# Patient Record
Sex: Female | Born: 1985 | Race: White | Hispanic: No | Marital: Married | Smoking: Former smoker
Health system: Southern US, Community
[De-identification: ages and names within clinical notes are randomized; demographics above are authoritative.]

## PROBLEM LIST (undated history)

## (undated) DIAGNOSIS — Z789 Other specified health status: Secondary | ICD-10-CM

## (undated) DIAGNOSIS — Z8619 Personal history of other infectious and parasitic diseases: Secondary | ICD-10-CM

## (undated) DIAGNOSIS — G35 Multiple sclerosis: Secondary | ICD-10-CM

## (undated) DIAGNOSIS — G35D Multiple sclerosis, unspecified: Secondary | ICD-10-CM

## (undated) HISTORY — PX: FACIAL COSMETIC SURGERY: SHX629

## (undated) HISTORY — PX: OTHER SURGICAL HISTORY: SHX169

## (undated) HISTORY — DX: Personal history of other infectious and parasitic diseases: Z86.19

## (undated) HISTORY — PX: INNER EAR SURGERY: SHX679

---

## 2012-11-14 ENCOUNTER — Encounter: Payer: Self-pay | Admitting: Gynecology

## 2012-11-14 ENCOUNTER — Ambulatory Visit (INDEPENDENT_AMBULATORY_CARE_PROVIDER_SITE_OTHER): Payer: 59 | Admitting: Gynecology

## 2012-11-14 ENCOUNTER — Other Ambulatory Visit (HOSPITAL_COMMUNITY)
Admission: RE | Admit: 2012-11-14 | Discharge: 2012-11-14 | Disposition: A | Discharge status: Home / Self Care | Payer: 59 | Source: Ambulatory Visit | Attending: Gynecology | Admitting: Gynecology

## 2012-11-14 VITALS — BP 110/72 | Ht 70.0 in | Wt 175.0 lb

## 2012-11-14 DIAGNOSIS — R635 Abnormal weight gain: Secondary | ICD-10-CM

## 2012-11-14 DIAGNOSIS — Z01419 Encounter for gynecological examination (general) (routine) without abnormal findings: Secondary | ICD-10-CM

## 2012-11-14 DIAGNOSIS — Z833 Family history of diabetes mellitus: Secondary | ICD-10-CM

## 2012-11-14 LAB — LIPID PANEL
Cholesterol: 137 mg/dL (ref 0–200)
HDL: 56 mg/dL (ref >39)
LDL Cholesterol: 75 mg/dL (ref 0–99)
Total CHOL/HDL Ratio: 2.4 Ratio
Triglycerides: 31 mg/dL (ref <150)
VLDL: 6 mg/dL (ref 0–40)

## 2012-11-14 LAB — CBC WITH DIFFERENTIAL/PLATELET
Basophils Absolute: 0 10*3/uL (ref 0.0–0.1)
Basophils Relative: 0 % (ref 0–1)
Eosinophils Absolute: 0.4 10*3/uL (ref 0.0–0.7)
Eosinophils Relative: 7 % — ABNORMAL HIGH (ref 0–5)
HCT: 39.8 % (ref 36.0–46.0)
Hemoglobin: 13.5 g/dL (ref 12.0–15.0)
Lymphocytes Relative: 41 % (ref 12–46)
Lymphs Abs: 2.4 10*3/uL (ref 0.7–4.0)
MCH: 30.8 pg (ref 26.0–34.0)
MCHC: 33.9 g/dL (ref 30.0–36.0)
MCV: 90.7 fL (ref 78.0–100.0)
Monocytes Absolute: 0.7 10*3/uL (ref 0.1–1.0)
Monocytes Relative: 12 % (ref 3–12)
Neutro Abs: 2.4 10*3/uL (ref 1.7–7.7)
Neutrophils Relative %: 40 % — ABNORMAL LOW (ref 43–77)
Platelets: 294 10*3/uL (ref 150–400)
RBC: 4.39 MIL/uL (ref 3.87–5.11)
RDW: 12.8 % (ref 11.5–15.5)
WBC: 5.9 10*3/uL (ref 4.0–10.5)

## 2012-11-14 LAB — HEMOGLOBIN A1C
Hgb A1c MFr Bld: 5.4 % (ref <5.7)
Mean Plasma Glucose: 108 mg/dL (ref <117)

## 2012-11-14 LAB — TSH: TSH: 1.644 u[IU]/mL (ref 0.350–4.500)

## 2012-11-14 NOTE — Progress Notes (Signed)
Chloe Pittman 1986/06/20 [id number]   History:    27 y.o.  for annual gyn exam who moved to [place] approximately 2 years ago from [place]. Patient stated she's only had one Pap smear and it was normal. She is in a steady monogamous relationship for several years.She's currently not using any form of contraception and is not interested at the present time. Her cycles are reported to be regular. Patient states she does her monthly self breast examination. Patient not interested in flu vaccine. Patient has not had the Gardasil Vaccine. Both parents are non-insulin-dependent diabetic.  Past medical history,surgical history, family history and social history were all reviewed and documented in the EPIC chart.  Gynecologic History Patient's last menstrual period was 10/16/2012. Contraception: none Last Pap: 2006. Results were: normal Last mammogram: Not indicated. Results were: Not indicated  Obstetric History OB History    Grav Para Term Preterm Abortions TAB SAB Ect Mult Living                   ROS: A ROS was performed and pertinent positives and negatives are included in the history.  GENERAL: No fevers or chills. HEENT: No change in vision, no earache, sore throat or sinus congestion. NECK: No pain or stiffness. CARDIOVASCULAR: No chest pain or pressure. No palpitations. PULMONARY: No shortness of breath, cough or wheeze. GASTROINTESTINAL: No abdominal pain, nausea, vomiting or diarrhea, melena or bright red blood per rectum. GENITOURINARY: No urinary frequency, urgency, hesitancy or dysuria. MUSCULOSKELETAL: No joint or muscle pain, no back pain, no recent trauma. DERMATOLOGIC: No rash, no itching, no lesions. ENDOCRINE: No polyuria, polydipsia, no heat or cold intolerance. No recent change in weight. HEMATOLOGICAL: No anemia or easy bruising or bleeding. NEUROLOGIC: No headache, seizures, numbness, tingling or weakness. PSYCHIATRIC: No depression, no loss of interest in normal activity or  change in sleep pattern.     Exam: chaperone present  BP 110/72  Ht 5\' 10"  (1.778 m)  Wt 175 lb (79.379 kg)  BMI 25.11 kg/m2  LMP 10/16/2012  Body mass index is 25.11 kg/(m^2).  General appearance : Well developed well nourished female. No acute distress HEENT: Neck supple, trachea midline, no carotid bruits, no thyroidmegaly Lungs: Clear to auscultation, no rhonchi or wheezes, or rib retractions  Heart: Regular rate and rhythm, no murmurs or gallops Breast:Examined in sitting and supine position were symmetrical in appearance, no palpable masses or tenderness,  no skin retraction, no nipple inversion, no nipple discharge, no skin discoloration, no axillary or supraclavicular lymphadenopathy Abdomen: no palpable masses or tenderness, no rebound or guarding Extremities: no edema or skin discoloration or tenderness  Pelvic:  Bartholin, Urethra, Skene Glands: Within normal limits             Vagina: No gross lesions or discharge  Cervix: No gross lesions or discharge  Uterus  retroverted, normal size, shape and consistency, non-tender and mobile  Adnexa  Without masses or tenderness  Anus and perineum  normal   Rectovaginal  normal sphincter tone without palpated masses or tenderness             Hemoccult not indicated     Assessment/Plan:  27 y.o. female for annual exam . We discussed the new Pap smear screening guidelines. Pap smear was done today. The following labs were ordered: Fasting lipid profile, hemoglobin A1c, CBC, urinalysis as well as TSH. Patient is going to try to obtain a copy of her immunization record so that we can update our filed. Literature  information on the Gardasil Vaccine was provided as well. Since she is not using any form of contraception of encouraged she start taking prenatal vitamins for neural tube prophylaxis in the event that she were to get pregnant. She was encouraged to do her monthly self breast examination.    Ok Edwards MD, 1:43 PM  11/14/2012

## 2012-11-14 NOTE — Patient Instructions (Addendum)

## 2013-11-20 ENCOUNTER — Encounter: Payer: 59 | Admitting: Gynecology

## 2014-08-13 ENCOUNTER — Encounter: Payer: Self-pay | Admitting: Gynecology

## 2014-08-18 ENCOUNTER — Encounter: Payer: Self-pay | Admitting: Gynecology

## 2014-08-18 ENCOUNTER — Telehealth: Payer: Self-pay | Admitting: Gynecology

## 2014-08-18 ENCOUNTER — Ambulatory Visit (INDEPENDENT_AMBULATORY_CARE_PROVIDER_SITE_OTHER): Payer: Managed Care, Other (non HMO) | Admitting: Gynecology

## 2014-08-18 VITALS — BP 124/70 | Ht 69.0 in | Wt 180.0 lb

## 2014-08-18 DIAGNOSIS — Z01419 Encounter for gynecological examination (general) (routine) without abnormal findings: Secondary | ICD-10-CM

## 2014-08-18 DIAGNOSIS — Z30014 Encounter for initial prescription of intrauterine contraceptive device: Secondary | ICD-10-CM

## 2014-08-18 NOTE — Patient Instructions (Signed)
Intrauterine Device Insertion Most often, an intrauterine device (IUD) is inserted into the uterus to prevent pregnancy. There are 2 types of IUDs available:  Copper IUD--This type of IUD creates an environment that is not favorable to sperm survival. The mechanism of action of the copper IUD is not known for certain. It can stay in place for 10 years.  Hormone IUD--This type of IUD contains the hormone progestin (synthetic progesterone). The progestin thickens the cervical mucus and prevents sperm from entering the uterus, and it also thins the uterine lining. There is no evidence that the hormone IUD prevents implantation. One hormone IUD can stay in place for up to 5 years, and a different hormone IUD can stay in place for up to 3 years. An IUD is the most cost-effective birth control if left in place for the full duration. It may be removed at any time. LET YOUR HEALTH CARE PROVIDER KNOW ABOUT:  Any allergies you have.  All medicines you are taking, including vitamins, herbs, eye drops, creams, and over-the-counter medicines.  Previous problems you or members of your family have had with the use of anesthetics.  Any blood disorders you have.  Previous surgeries you have had.  Possibility of pregnancy.  Medical conditions you have. RISKS AND COMPLICATIONS  Generally, intrauterine device insertion is a safe procedure. However, as with any procedure, complications can occur. Possible complications include:  Accidental puncture (perforation) of the uterus.  Accidental placement of the IUD either in the muscle layer of the uterus (myometrium) or outside the uterus. If this happens, the IUD can be found essentially floating around the bowels and must be taken out surgically.  The IUD may fall out of the uterus (expulsion). This is more common in women who have recently had a child.   Pregnancy in the fallopian tube (ectopic).  Pelvic inflammatory disease (PID), which is infection of  the uterus and fallopian tubes. The risk of PID is slightly increased in the first 20 days after the IUD is placed, but the overall risk is still very low. BEFORE THE PROCEDURE  Schedule the IUD insertion for when you will have your menstrual period or right after, to make sure you are not pregnant. Placement of the IUD is better tolerated shortly after a menstrual cycle.  You may need to take tests or be examined to make sure you are not pregnant.  You may be required to take a pregnancy test.  You may be required to get checked for sexually transmitted infections (STIs) prior to placement. Placing an IUD in someone who has an infection can make the infection worse.  You may be given a pain reliever to take 1 or 2 hours before the procedure.  An exam will be performed to determine the size and position of your uterus.  Ask your health care provider about changing or stopping your regular medicines. PROCEDURE   A tool (speculum) is placed in the vagina. This allows your health care provider to see the lower part of the uterus (cervix).  The cervix is prepped with a medicine that lowers the risk of infection.  You may be given a medicine to numb each side of the cervix (intracervical or paracervical block). This is used to block and control any discomfort with insertion.  A tool (uterine sound) is inserted into the uterus to determine the length of the uterine cavity and the direction the uterus may be tilted.  A slim instrument (IUD inserter) is inserted through the cervical   canal and into your uterus.  The IUD is placed in the uterine cavity and the insertion device is removed.  The nylon string that is attached to the IUD and used for eventual IUD removal is trimmed. It is trimmed so that it lays high in the vagina, just outside the cervix. AFTER THE PROCEDURE  You may have bleeding after the procedure. This is normal. It varies from light spotting for a few days to menstrual-like  bleeding.  You may have mild cramping. Document Released: 06/20/2011 Document Revised: 08/12/2013 Document Reviewed: 04/12/2013 ExitCare Patient Information 2015 [employer]. This information is not intended to replace advice given to you by your health care provider. Make sure you discuss any questions you have with your health care provider.  

## 2014-08-18 NOTE — Progress Notes (Signed)
    Chloe Pittman [birth year] [id number]   History:    28 y.o.  for annual gyn exam for annual exam and wanted to discuss the different types of IUD. Patient had several questions and had done her home work. She is using condoms when necessary. Patient with no past history of abnormal Pap smear in the last one was here in our office for the first time when she came as a new patient in 2014. Patient reports normal menstrual cycles which last 5-7 days. Patient recently got married this year.  Past medical history,surgical history, family history and social history were all reviewed and documented in the EPIC chart.  Gynecologic History No LMP recorded. Contraception: condoms Last Pap: 2014. Results were: normal Last mammogram: Not indicated. Results were: Not indicated  Obstetric History OB History  No data available     ROS: A ROS was performed and pertinent positives and negatives are included in the history.  GENERAL: No fevers or chills. HEENT: No change in vision, no earache, sore throat or sinus congestion. NECK: No pain or stiffness. CARDIOVASCULAR: No chest pain or pressure. No palpitations. PULMONARY: No shortness of breath, cough or wheeze. GASTROINTESTINAL: No abdominal pain, nausea, vomiting or diarrhea, melena or bright red blood per rectum. GENITOURINARY: No urinary frequency, urgency, hesitancy or dysuria. MUSCULOSKELETAL: No joint or muscle pain, no back pain, no recent trauma. DERMATOLOGIC: No rash, no itching, no lesions. ENDOCRINE: No polyuria, polydipsia, no heat or cold intolerance. No recent change in weight. HEMATOLOGICAL: No anemia or easy bruising or bleeding. NEUROLOGIC: No headache, seizures, numbness, tingling or weakness. PSYCHIATRIC: No depression, no loss of interest in normal activity or change in sleep pattern.     Exam: chaperone present  BP 124/70  Ht 5\' 9"  (1.753 m)  Wt 180 lb (81.647 kg)  BMI 26.57 kg/m2  Body mass index is 26.57 kg/(m^2).  General  appearance : Well developed well nourished female. No acute distress HEENT: Neck supple, trachea midline, no carotid bruits, no thyroidmegaly Lungs: Clear to auscultation, no rhonchi or wheezes, or rib retractions  Heart: Regular rate and rhythm, no murmurs or gallops Breast:Examined in sitting and supine position were symmetrical in appearance, no palpable masses or tenderness,  no skin retraction, no nipple inversion, no nipple discharge, no skin discoloration, no axillary or supraclavicular lymphadenopathy Abdomen: no palpable masses or tenderness, no rebound or guarding Extremities: no edema or skin discoloration or tenderness  Pelvic:  Bartholin, Urethra, Skene Glands: Within normal limits             Vagina: No gross lesions or discharge  Cervix: No gross lesions or discharge  Uterus  anteverted, normal size, shape and consistency, non-tender and mobile  Adnexa  Without masses or tenderness  Anus and perineum  normal   Rectovaginal  normal sphincter tone without palpated masses or tenderness             Hemoccult not indicated     Assessment/Plan:  28 y.o. female for annual exam was counseled on the 3 different types of IUD. Literature information was provided patient will decide next week when she returns to her menses to have when inserted. Pap smear not done today in accordance to the new guidelines. The following labs were ordered: CBC, screening cholesterol, comprehensive metabolic panel, TSH and urinalysis. Patient refused flu vaccine.   Terrance Mass MD, 4:38 PM 08/18/2014

## 2014-08-18 NOTE — Telephone Encounter (Signed)
08/18/14-Pt was advised today that her [employer] will cover an IUD and insertion at 100%,no copay. She will advise which IUD she wants to use and call. Knows needs to be inserted while on cycle.WL

## 2014-08-20 ENCOUNTER — Other Ambulatory Visit: Payer: Self-pay | Admitting: Gynecology

## 2014-08-20 DIAGNOSIS — Z30431 Encounter for routine checking of intrauterine contraceptive device: Secondary | ICD-10-CM

## 2014-10-05 ENCOUNTER — Telehealth: Payer: Self-pay | Admitting: Anesthesiology

## 2014-10-05 NOTE — Telephone Encounter (Signed)
Left message on patient's voicemail to return to the office to have labs drawn. She forgot to stop by the lab when she came for her office visit in October.Marland Kitchen

## 2014-10-06 ENCOUNTER — Other Ambulatory Visit: Payer: Managed Care, Other (non HMO)

## 2014-10-06 ENCOUNTER — Ambulatory Visit: Payer: Managed Care, Other (non HMO) | Admitting: Gynecology

## 2014-10-06 ENCOUNTER — Ambulatory Visit (INDEPENDENT_AMBULATORY_CARE_PROVIDER_SITE_OTHER): Payer: Managed Care, Other (non HMO) | Admitting: Gynecology

## 2014-10-06 ENCOUNTER — Encounter: Payer: Self-pay | Admitting: Gynecology

## 2014-10-06 VITALS — BP 114/70

## 2014-10-06 DIAGNOSIS — Z975 Presence of (intrauterine) contraceptive device: Secondary | ICD-10-CM

## 2014-10-06 DIAGNOSIS — Z30430 Encounter for insertion of intrauterine contraceptive device: Secondary | ICD-10-CM

## 2014-10-06 LAB — CBC WITH DIFFERENTIAL/PLATELET
BASOS PCT: 0 % (ref 0–1)
Basophils Absolute: 0 10*3/uL (ref 0.0–0.1)
EOS ABS: 0.4 10*3/uL (ref 0.0–0.7)
Eosinophils Relative: 6 % — ABNORMAL HIGH (ref 0–5)
HEMATOCRIT: 37.1 % (ref 36.0–46.0)
HEMOGLOBIN: 12.6 g/dL (ref 12.0–15.0)
Lymphocytes Relative: 36 % (ref 12–46)
Lymphs Abs: 2.4 10*3/uL (ref 0.7–4.0)
MCH: 31.3 pg (ref 26.0–34.0)
MCHC: 34 g/dL (ref 30.0–36.0)
MCV: 92.1 fL (ref 78.0–100.0)
MPV: 10.6 fL (ref 9.4–12.4)
Monocytes Absolute: 0.6 10*3/uL (ref 0.1–1.0)
Monocytes Relative: 9 % (ref 3–12)
NEUTROS ABS: 3.2 10*3/uL (ref 1.7–7.7)
NEUTROS PCT: 49 % (ref 43–77)
Platelets: 281 10*3/uL (ref 150–400)
RBC: 4.03 MIL/uL (ref 3.87–5.11)
RDW: 13.3 % (ref 11.5–15.5)
WBC: 6.6 10*3/uL (ref 4.0–10.5)

## 2014-10-06 LAB — COMPREHENSIVE METABOLIC PANEL
ALBUMIN: 4.3 g/dL (ref 3.5–5.2)
ALK PHOS: 41 U/L (ref 39–117)
ALT: 9 U/L (ref 0–35)
AST: 14 U/L (ref 0–37)
BILIRUBIN TOTAL: 0.6 mg/dL (ref 0.2–1.2)
BUN: 25 mg/dL — ABNORMAL HIGH (ref 6–23)
CO2: 26 mEq/L (ref 19–32)
Calcium: 9.3 mg/dL (ref 8.4–10.5)
Chloride: 104 mEq/L (ref 96–112)
Creat: 0.84 mg/dL (ref 0.50–1.10)
Glucose, Bld: 65 mg/dL — ABNORMAL LOW (ref 70–99)
POTASSIUM: 4.2 meq/L (ref 3.5–5.3)
SODIUM: 138 meq/L (ref 135–145)
TOTAL PROTEIN: 7.1 g/dL (ref 6.0–8.3)

## 2014-10-06 LAB — TSH: TSH: 1.499 u[IU]/mL (ref 0.350–4.500)

## 2014-10-06 LAB — CHOLESTEROL, TOTAL: Cholesterol: 140 mg/dL (ref 0–200)

## 2014-10-06 NOTE — Progress Notes (Signed)
   Patient presented to the office today for placement of the [facility] IUD. Patient is on the first day of her menstrual cycle. Patient previously had been provided with literature information on the different types of IUDs and she has selected this one.                                                                    IUD procedure note       Patient presented to the office today for placement of Skyla IUD. The patient had previously been provided with literature information on this method of contraception. The risks benefits and pros and cons were discussed and all her questions were answered. She is fully aware that this form of contraception is 99% effective and is good for 3 years.  Pelvic exam: Bartholin urethra Skene glands: Within normal limits Vagina: No lesions or discharge, menstrual blood present Cervix: No lesions or discharge Uterus: Anteverted position Adnexa: No masses or tenderness Rectal exam: Not done  The cervix was cleansed with Betadine solution. A single-tooth tenaculum was placed on the anterior cervical lip. The uterus sounded to 7 centimeter. The IUD was shown to the patient and inserted in a sterile fashion. The IUD string was trimmed. The single-tooth tenaculum was removed. Patient was instructed to return back to the office in one month for follow up.       lot number [id number]

## 2014-10-06 NOTE — Addendum Note (Signed)
Addended by: Federico Flake on: 10/06/2014 12:52 PM   Modules accepted: Orders

## 2014-10-06 NOTE — Patient Instructions (Signed)

## 2014-10-07 ENCOUNTER — Encounter: Payer: Self-pay | Admitting: Gynecology

## 2014-10-07 ENCOUNTER — Other Ambulatory Visit: Payer: Self-pay | Admitting: Gynecology

## 2014-10-07 DIAGNOSIS — R799 Abnormal finding of blood chemistry, unspecified: Secondary | ICD-10-CM

## 2014-10-15 ENCOUNTER — Telehealth: Payer: Self-pay | Admitting: *Deleted

## 2014-10-15 ENCOUNTER — Other Ambulatory Visit: Payer: Self-pay

## 2014-10-15 MED ORDER — TRAMADOL HCL 50 MG PO TABS: 50.0000 mg | ORAL_TABLET | Freq: Four times a day (QID) | ORAL | Status: DC | PRN

## 2014-10-15 NOTE — Telephone Encounter (Signed)
Rx called into pharmacy for pt. 

## 2014-10-15 NOTE — Telephone Encounter (Signed)
Pt had Sklya placed on 10/06/14 c/o 1 week of cramping and spotting, pt said spotting is not bad, more annoyed by cramping which is continuously. Tried OTC ibuprofen and tylenol and no help. Please advise

## 2014-10-15 NOTE — Telephone Encounter (Signed)
Pt leaving for out of town said she will call back with a pharmacy.

## 2014-10-15 NOTE — Telephone Encounter (Signed)
Call in prescription for Ultram  50 mg one by mouth every 6 hours when necessary #30

## 2014-11-03 ENCOUNTER — Ambulatory Visit (INDEPENDENT_AMBULATORY_CARE_PROVIDER_SITE_OTHER): Payer: Managed Care, Other (non HMO) | Admitting: Gynecology

## 2014-11-03 ENCOUNTER — Encounter: Payer: Self-pay | Admitting: Gynecology

## 2014-11-03 VITALS — BP 124/78

## 2014-11-03 DIAGNOSIS — Z30431 Encounter for routine checking of intrauterine contraceptive device: Secondary | ICD-10-CM

## 2014-11-03 NOTE — Progress Notes (Signed)
   Patient presented to the office for her 1 month follow-up after having placed the [facility] IUD. Patient states she has been cramping on and off since it was placed along with bleeding irregularity. She has had intercourse with no complaints. She feels slightly bloated.   Exam: Abdomen: Soft nontender no rebound or guarding Pelvic: Bartholin urethra Skene was within normal limits Vagina: Some menstrual blood was noted Cervix: No lesions seen IUD string not visualized Uterus: Anteverted normal size shape and consistency Adnexa: No palpable mass or tenderness Rectal exam: Not done  Assessment/plan: One month status post placement Skyla IUD string not visualized. Patient complaining of cramping and irregular bleeding. Patient will return back tomorrow for an ultrasound to confirm position of the IUD. If it's in the proper place we'll place her on Estrace 1 mg by mouth daily for 25 days to build up her endometrium and help her stop the bleeding.

## 2014-11-04 ENCOUNTER — Ambulatory Visit (INDEPENDENT_AMBULATORY_CARE_PROVIDER_SITE_OTHER): Payer: Managed Care, Other (non HMO) | Admitting: Gynecology

## 2014-11-04 ENCOUNTER — Encounter: Payer: Self-pay | Admitting: Gynecology

## 2014-11-04 ENCOUNTER — Other Ambulatory Visit: Payer: Self-pay | Admitting: Gynecology

## 2014-11-04 ENCOUNTER — Ambulatory Visit (INDEPENDENT_AMBULATORY_CARE_PROVIDER_SITE_OTHER): Payer: Managed Care, Other (non HMO)

## 2014-11-04 DIAGNOSIS — N939 Abnormal uterine and vaginal bleeding, unspecified: Secondary | ICD-10-CM

## 2014-11-04 DIAGNOSIS — R102 Pelvic and perineal pain: Secondary | ICD-10-CM

## 2014-11-04 DIAGNOSIS — Z975 Presence of (intrauterine) contraceptive device: Principal | ICD-10-CM

## 2014-11-04 DIAGNOSIS — Z30431 Encounter for routine checking of intrauterine contraceptive device: Secondary | ICD-10-CM

## 2014-11-04 DIAGNOSIS — N921 Excessive and frequent menstruation with irregular cycle: Secondary | ICD-10-CM

## 2014-11-04 NOTE — Progress Notes (Signed)
   Patient is a 28 year old who was seen in the office this week for 1 month follow-up after having had the [facility] IUD placed. Patient been complaining of cramping on and off and bleeding irregularly throughout the entire month. She had had intercourse with no complaints. She also complained of slightly feeling bloated. She is here to discuss her ultrasound.  Ultrasound: Uterus measured 8.2 x 4.5 x 3.5 cm with endometrial stripe of 3.8 mm. IUD was seen in the endometrial cavity in the correct position. Normal ovaries and uterus noted. No fluid in the cul-de-sac.   Assessment/plan: Dysfunctional uterine bleeding with IUD. Patient reassured this may recur for the first 4-6 weeks. She will take Aleve for an milligrams 3 times a day for the next 5-7 days to help with her bleeding. I've also given her prescription of Estrace 1 mg to take 1 by mouth daily for 25 days if this does not improve on its own over the course of the next 2 weeks. Patient otherwise scheduled to return back to the office next year for annual exam or when necessary.

## 2015-01-24 ENCOUNTER — Telehealth: Payer: Self-pay | Admitting: *Deleted

## 2015-01-24 NOTE — Telephone Encounter (Signed)
(  Pt aware you are out of the office) pt has Chloe Pittman was placed in Dec. 2015 pt experienced some terrible cramping this past weekend took ultram 50 mg and twice a day to help with cramping. Her cycle started on 01/20/15. No cramping now, bleeding is now spotting, her concern is if this will happen again. Normally patient had spotting only with skyla, first "true cycle" this month. So patient would like to know if you recommend she watch and see if her next cycle should be "bad". Pt would like you recommendations. Please advise

## 2015-01-24 NOTE — Telephone Encounter (Signed)
Lets make an appointment for her with and ultrasound to check for placement and discussed treatment options.

## 2015-01-25 NOTE — Telephone Encounter (Signed)
Left the below on pt voicemail. 

## 2015-03-25 ENCOUNTER — Telehealth: Payer: Self-pay | Admitting: *Deleted

## 2015-03-25 NOTE — Telephone Encounter (Signed)
If we can get ultrasound today it would be great but if she wants it out then no ultrasound and come in today to have it removed

## 2015-03-25 NOTE — Telephone Encounter (Signed)
Front desk will be informed.

## 2015-03-25 NOTE — Telephone Encounter (Signed)
See telephone encounter 01/24/15, pt called front desk and said she wants it removed. Do you still want the ultrasound that day? Please advise

## 2015-03-25 NOTE — Telephone Encounter (Signed)
Pt is scheduled on 04/22/15 was offered sooner date to come in and unable to come. This date is fine for pt.

## 2015-04-20 ENCOUNTER — Telehealth: Payer: Self-pay | Admitting: *Deleted

## 2015-04-20 NOTE — Telephone Encounter (Signed)
Pt also asked about Rx for pain? Please advise

## 2015-04-20 NOTE — Telephone Encounter (Signed)
You can call and or she can come by to pick up Valium 5 mg she can take 1 tablet tonight before she goes to sleep and 1 tablet before she comes to the office. Call in 2 tablets

## 2015-04-20 NOTE — Telephone Encounter (Signed)
Pt is scheduled to have Mirena IUD removed tomorrow very nervous about having removed. Pt asked if we she can have Rx and pain that she can use for removal date. Please advise

## 2015-04-20 NOTE — Telephone Encounter (Signed)
Ultram 50 mg one by mouth every 6 hours when necessary #20

## 2015-04-21 MED ORDER — TRAMADOL HCL 50 MG PO TABS: 50.0000 mg | ORAL_TABLET | Freq: Four times a day (QID) | ORAL | Status: DC | PRN

## 2015-04-21 MED ORDER — DIAZEPAM 5 MG PO TABS: ORAL_TABLET | ORAL | Status: DC

## 2015-04-21 NOTE — Telephone Encounter (Signed)
Pt aware the below, rx called in

## 2015-04-21 NOTE — Telephone Encounter (Signed)
Left message for pt to call.

## 2015-04-22 ENCOUNTER — Ambulatory Visit (INDEPENDENT_AMBULATORY_CARE_PROVIDER_SITE_OTHER): Payer: Managed Care, Other (non HMO) | Admitting: Gynecology

## 2015-04-22 ENCOUNTER — Encounter: Payer: Self-pay | Admitting: Gynecology

## 2015-04-22 VITALS — BP 116/70

## 2015-04-22 DIAGNOSIS — N921 Excessive and frequent menstruation with irregular cycle: Secondary | ICD-10-CM

## 2015-04-22 DIAGNOSIS — Z975 Presence of (intrauterine) contraceptive device: Principal | ICD-10-CM

## 2015-04-22 DIAGNOSIS — Z30432 Encounter for removal of intrauterine contraceptive device: Secondary | ICD-10-CM | POA: Diagnosis not present

## 2015-04-22 NOTE — Progress Notes (Signed)
   Patient presented to the office today requesting removal of her Chloe Pittman IUD that was placed in December 2015. She has continued having irregular bleeding. She is now contemplating getting pregnant is recently started prenatal vitamins. She denies any birth defects and her side of the family or her partner. Patient's healthy otherwise.  Exam: Blood pressure 116/70 Gen. appearance well-developed will nourished female in no acute distress Abdomen: Soft nontender no rebound or guarding Pelvic: Bartholin urethra Skene was within normal limits Vagina: No lesions or discharge Cervix: IUD string not visualized  Procedure note: The cervix was cleansed with Betadine solution and a small hole was introduced into the cervical canal to retrieve the string and then it was grasped with the [facility] clamp retrieved shown to the patient and discarded  Assessment/plan: Patient dysfunctional uterine bleeding with the IUD which was removed today since she is contemplating getting pregnant. IUD removed without complications. She was reminded to take prenatal vitamins for neural tube defect prophylaxis. We discussed the utilization of ovulation predictor kit for timing of intercourse. Patient scheduled to return at the end of the year for her annual exam or when necessary.

## 2015-06-10 ENCOUNTER — Ambulatory Visit (INDEPENDENT_AMBULATORY_CARE_PROVIDER_SITE_OTHER): Payer: Managed Care, Other (non HMO) | Admitting: Gynecology

## 2015-06-10 ENCOUNTER — Encounter: Payer: Self-pay | Admitting: Gynecology

## 2015-06-10 VITALS — BP 112/72

## 2015-06-10 DIAGNOSIS — N644 Mastodynia: Secondary | ICD-10-CM

## 2015-06-10 NOTE — Patient Instructions (Signed)
Apply heat to the tender area in the breast. Follow up if the discomfort continues over the next several weeks.

## 2015-06-10 NOTE — Progress Notes (Signed)
Chloe Pittman 09-11-1986 [id number]        29 y.o.  Presents with a history of hitting her right breast 5 days ago with persistent discomfort since then. Not sure she feels some nodularity in this area. No nipple discharge or other symptoms.  Past medical history,surgical history, problem list, medications, allergies, family history and social history were all reviewed and documented in the EPIC chart.  Directed ROS with pertinent positives and negatives documented in the history of present illness/assessment and plan.  Exam:  Kim assistant Filed Vitals:   06/10/15 1158  BP: 112/72   General appearance:  Normal Both breast examined lying and sitting without masses retractions discharge adenopathy. The area the patient's pointing to his diagramed below.  There is no palpable or visual abnormalities in this area. No evidence of bruising or other changes Physical Exam  Pulmonary/Chest:       Assessment/Plan:  29 y.o. with tenderness in the area where she hit her breast 5 days ago.  Was not sure she has felt some nodularity. My exam is totally normal. Recommended patient place heat to the area monitor for now. If this would continue to feel tender or certainly she feels more changes she knows to represent over the next several weeks for a more involved evaluation to include ultrasound possible mammogram.    Anastasio Auerbach MD, 12:08 PM 06/10/2015

## 2015-08-02 LAB — OB RESULTS CONSOLE HIV ANTIBODY (ROUTINE TESTING): HIV: NONREACTIVE

## 2015-08-02 LAB — OB RESULTS CONSOLE RPR: RPR: NONREACTIVE

## 2015-08-02 LAB — OB RESULTS CONSOLE ABO/RH: RH Type: POSITIVE

## 2015-08-02 LAB — OB RESULTS CONSOLE RUBELLA ANTIBODY, IGM: RUBELLA: IMMUNE

## 2015-08-02 LAB — OB RESULTS CONSOLE GC/CHLAMYDIA
Chlamydia: NEGATIVE
GC PROBE AMP, GENITAL: NEGATIVE

## 2015-08-02 LAB — OB RESULTS CONSOLE ANTIBODY SCREEN: ANTIBODY SCREEN: NEGATIVE

## 2015-08-02 LAB — OB RESULTS CONSOLE HEPATITIS B SURFACE ANTIGEN: HEP B S AG: NEGATIVE

## 2015-09-02 ENCOUNTER — Encounter: Payer: Managed Care, Other (non HMO) | Admitting: Gynecology

## 2015-11-06 NOTE — L&D Delivery Note (Signed)
Delivery Note Patient pushed well for 1hr 20 minutes.  At 10:33 PM a viable female was delivered via Vaginal, Spontaneous Delivery (Presentation: Right Occiput Posterior).  APGAR: 9, 9; weight pending.   Placenta status: Intact, Spontaneous.  Cord: 3 vessels with the following complications: None.  Cord pH: n/a  Anesthesia: Epidural  Episiotomy:  none Lacerations: Vaginal Suture Repair: 3.0 vicryl rapide Est. Blood Loss (mL): 300  Mom to postpartum.  Baby to Couplet care / Skin to Skin.  [place] 02/13/2016, 11:06 PM

## 2016-01-13 LAB — OB RESULTS CONSOLE GBS: GBS: NEGATIVE

## 2016-02-13 ENCOUNTER — Inpatient Hospital Stay (HOSPITAL_COMMUNITY): Payer: BLUE CROSS/BLUE SHIELD | Admitting: Anesthesiology

## 2016-02-13 ENCOUNTER — Encounter (HOSPITAL_COMMUNITY): Payer: Self-pay | Admitting: *Deleted

## 2016-02-13 ENCOUNTER — Telehealth (HOSPITAL_COMMUNITY): Payer: Self-pay | Admitting: *Deleted

## 2016-02-13 ENCOUNTER — Inpatient Hospital Stay (HOSPITAL_COMMUNITY)
Admission: AD | Admit: 2016-02-13 | Discharge: 2016-02-15 | DRG: 775 | Disposition: A | Discharge status: Home / Self Care | Payer: BLUE CROSS/BLUE SHIELD | Source: Ambulatory Visit | Attending: Obstetrics | Admitting: Obstetrics

## 2016-02-13 DIAGNOSIS — Z87891 Personal history of nicotine dependence: Secondary | ICD-10-CM

## 2016-02-13 DIAGNOSIS — Z3A40 40 weeks gestation of pregnancy: Secondary | ICD-10-CM

## 2016-02-13 DIAGNOSIS — Z3490 Encounter for supervision of normal pregnancy, unspecified, unspecified trimester: Secondary | ICD-10-CM

## 2016-02-13 HISTORY — DX: Other specified health status: Z78.9

## 2016-02-13 LAB — CBC
HEMATOCRIT: 37.7 % (ref 36.0–46.0)
HEMOGLOBIN: 13 g/dL (ref 12.0–15.0)
MCH: 31.9 pg (ref 26.0–34.0)
MCHC: 34.5 g/dL (ref 30.0–36.0)
MCV: 92.4 fL (ref 78.0–100.0)
Platelets: 278 10*3/uL (ref 150–400)
RBC: 4.08 MIL/uL (ref 3.87–5.11)
RDW: 12.6 % (ref 11.5–15.5)
WBC: 13.5 10*3/uL — ABNORMAL HIGH (ref 4.0–10.5)

## 2016-02-13 LAB — ABO/RH: ABO/RH(D): O POS

## 2016-02-13 LAB — TYPE AND SCREEN
ABO/RH(D): O POS
Antibody Screen: NEGATIVE

## 2016-02-13 MED ORDER — LIDOCAINE HCL (PF) 1 % IJ SOLN
INTRAMUSCULAR | Status: DC | PRN
  Administered 2016-02-13 (×2): 6 mL

## 2016-02-13 MED ORDER — FENTANYL 2.5 MCG/ML BUPIVACAINE 1/10 % EPIDURAL INFUSION (WH - ANES)
14.0000 mL/h | INTRAMUSCULAR | Status: DC | PRN
  Administered 2016-02-13 (×3): 14 mL/h via EPIDURAL
  Filled 2016-02-13: qty 125

## 2016-02-13 MED ORDER — DIPHENHYDRAMINE HCL 50 MG/ML IJ SOLN: 12.5000 mg | INTRAMUSCULAR | Status: DC | PRN

## 2016-02-13 MED ORDER — OXYCODONE-ACETAMINOPHEN 5-325 MG PO TABS: 2.0000 | ORAL_TABLET | ORAL | Status: DC | PRN

## 2016-02-13 MED ORDER — FENTANYL 2.5 MCG/ML BUPIVACAINE 1/10 % EPIDURAL INFUSION (WH - ANES)
INTRAMUSCULAR | Status: AC
  Filled 2016-02-13: qty 125

## 2016-02-13 MED ORDER — PHENYLEPHRINE 40 MCG/ML (10ML) SYRINGE FOR IV PUSH (FOR BLOOD PRESSURE SUPPORT)
80.0000 ug | PREFILLED_SYRINGE | INTRAVENOUS | Status: DC | PRN
  Filled 2016-02-13: qty 2

## 2016-02-13 MED ORDER — OXYTOCIN BOLUS FROM INFUSION
500.0000 mL | INTRAVENOUS | Status: DC
  Administered 2016-02-13: 500 mL via INTRAVENOUS

## 2016-02-13 MED ORDER — CITRIC ACID-SODIUM CITRATE 334-500 MG/5ML PO SOLN: 30.0000 mL | ORAL | Status: DC | PRN

## 2016-02-13 MED ORDER — LACTATED RINGERS IV SOLN: 500.0000 mL | Freq: Once | INTRAVENOUS | Status: DC

## 2016-02-13 MED ORDER — EPHEDRINE 5 MG/ML INJ
10.0000 mg | INTRAVENOUS | Status: DC | PRN
  Filled 2016-02-13: qty 2

## 2016-02-13 MED ORDER — ONDANSETRON HCL 4 MG/2ML IJ SOLN: 4.0000 mg | Freq: Four times a day (QID) | INTRAMUSCULAR | Status: DC | PRN

## 2016-02-13 MED ORDER — LACTATED RINGERS IV SOLN
500.0000 mL | Freq: Once | INTRAVENOUS | Status: AC
  Administered 2016-02-13: 500 mL via INTRAVENOUS

## 2016-02-13 MED ORDER — PHENYLEPHRINE 40 MCG/ML (10ML) SYRINGE FOR IV PUSH (FOR BLOOD PRESSURE SUPPORT)
PREFILLED_SYRINGE | INTRAVENOUS | Status: AC
  Filled 2016-02-13: qty 20

## 2016-02-13 MED ORDER — OXYTOCIN 10 UNIT/ML IJ SOLN
2.5000 [IU]/h | INTRAVENOUS | Status: DC
  Filled 2016-02-13: qty 4

## 2016-02-13 MED ORDER — LIDOCAINE HCL (PF) 1 % IJ SOLN
30.0000 mL | INTRAMUSCULAR | Status: DC | PRN
  Filled 2016-02-13: qty 30

## 2016-02-13 MED ORDER — OXYCODONE-ACETAMINOPHEN 5-325 MG PO TABS: 1.0000 | ORAL_TABLET | ORAL | Status: DC | PRN

## 2016-02-13 MED ORDER — ACETAMINOPHEN 325 MG PO TABS: 650.0000 mg | ORAL_TABLET | ORAL | Status: DC | PRN

## 2016-02-13 MED ORDER — LACTATED RINGERS IV SOLN: 500.0000 mL | INTRAVENOUS | Status: DC | PRN

## 2016-02-13 MED ORDER — FLEET ENEMA 7-19 GM/118ML RE ENEM: 1.0000 | ENEMA | Freq: Every day | RECTAL | Status: DC | PRN

## 2016-02-13 MED ORDER — LACTATED RINGERS IV SOLN
INTRAVENOUS | Status: DC
  Administered 2016-02-13: 18:00:00 via INTRAVENOUS

## 2016-02-13 NOTE — Anesthesia Procedure Notes (Signed)
Epidural Patient location during procedure: OB  Staffing Anesthesiologist: Franne Grip  Preanesthetic Checklist Completed: patient identified, site marked, surgical consent, pre-op evaluation, timeout performed, IV checked, risks and benefits discussed and monitors and equipment checked  Epidural Patient position: sitting Prep: DuraPrep Patient monitoring: heart rate and blood pressure Approach: midline Location: L4-L5 Injection technique: LOR air  Needle:  Needle type: Tuohy  Needle gauge: 17 G Needle insertion depth: 5 cm Catheter type: closed end flexible Catheter size: 19 Gauge Catheter at skin depth: 10 cm Test dose: negative and Other  Assessment Events: blood not aspirated, injection not painful, no injection resistance, negative IV test and no paresthesia  Additional Notes Reason for block:procedure for pain

## 2016-02-13 NOTE — H&P (Signed)
30 y.o. G1P0 @ [redacted]w[redacted]d presents with painful contractions.  Otherwise has good fetal movement and no bleeding.  Past Medical History  Diagnosis Date  . Hx of varicella   . Medical history non-contributory     Past Surgical History  Procedure Laterality Date  . Tubes in ears    . Inner ear surgery    . Facial cosmetic surgery      dog bite    OB History  Gravida Para Term Preterm AB SAB TAB Ectopic Multiple Living  1             # Outcome Date GA Lbr Len/2nd Weight Sex Delivery Anes PTL Lv  1 Current               Social History   Social History  . Marital Status: Married    Spouse Name: N/A  . Number of Children: N/A  . Years of Education: N/A   Occupational History  . Not on file.   Social History Main Topics  . Smoking status: Former Research scientist (life sciences)  . Smokeless tobacco: Never Used  . Alcohol Use: Yes     Comment: SOCIAL - WEEK ENDS   . Drug Use: No  . Sexual Activity: Yes    Birth Control/ Protection: None   Other Topics Concern  . Not on file   Social History Narrative   Sulfa antibiotics    Prenatal Transfer Tool  Maternal Diabetes: No Genetic Screening: Normal Maternal Ultrasounds/Referrals: Normal Fetal Ultrasounds or other Referrals:  None Maternal Substance Abuse:  No Significant Maternal Medications:  None Significant Maternal Lab Results: Lab values include: Group B Strep negative  ABO, Rh: O/Positive/-- (09/27 0000) Antibody: Negative (09/27 0000) Rubella:Immune RPR: Nonreactive (09/27 0000)  HBsAg: Negative (09/27 0000)  HIV: Non-reactive (09/27 0000)  GBS: Negative (03/10 0000)    Other PNC: uncomplicated.    Filed Vitals:   02/13/16 1448 02/13/16 1451  BP:  111/64  Pulse: 76 77  Resp:  18     General:  NAD Abdomen:  soft, gravid, EFW 8-8.5# Ex:  tr edema SVE:  8-9/90/0, AROM clear fluid FHTs:  130s, mod var, + accels, Cat 1 Toco:  q2-3 min  EFW: 3/30 3800g (88%, 8lb 8oz)  A/P   30 y.o. G1P0 [redacted]w[redacted]d presents with  labor Comfortable w epidural  FSR/ vtx/ GBS neg  [place]

## 2016-02-13 NOTE — Progress Notes (Signed)
Foley catheter removed, 10cc removed from foley balloon. 334ml urine output.

## 2016-02-13 NOTE — Telephone Encounter (Signed)
Preadmission screen  

## 2016-02-13 NOTE — MAU Note (Signed)
Pt c/o contractions starting at 10am. Pt denies bleeding and leaking of fluid. Pt states baby has been moving less than normal today.

## 2016-02-13 NOTE — Anesthesia Preprocedure Evaluation (Addendum)
Anesthesia Evaluation  Patient identified by MRN, date of birth, ID band Patient awake    Reviewed: Allergy & Precautions, NPO status , Patient's Chart, lab work & pertinent test results  Airway Mallampati: II  TM Distance: >3 FB Neck ROM: Full    Dental no notable dental hx.    Pulmonary former smoker,    Pulmonary exam normal breath sounds clear to auscultation       Cardiovascular negative cardio ROS Normal cardiovascular exam Rhythm:Regular Rate:Normal     Neuro/Psych negative neurological ROS  negative psych ROS   GI/Hepatic negative GI ROS, Neg liver ROS,   Endo/Other  negative endocrine ROS  Renal/GU negative Renal ROS  negative genitourinary   Musculoskeletal negative musculoskeletal ROS (+)   Abdominal   Peds negative pediatric ROS (+)  Hematology negative hematology ROS (+)   Anesthesia Other Findings   Reproductive/Obstetrics negative OB ROS                            Anesthesia Physical Anesthesia Plan  ASA: II  Anesthesia Plan: Epidural   Post-op Pain Management:    Induction: Intravenous  Airway Management Planned: Natural Airway  Additional Equipment:   Intra-op Plan:   Post-operative Plan:   Informed Consent: I have reviewed the patients History and Physical, chart, labs and discussed the procedure including the risks, benefits and alternatives for the proposed anesthesia with the patient or authorized representative who has indicated his/her understanding and acceptance.   Dental advisory given  Plan Discussed with: CRNA  Anesthesia Plan Comments: (Informed consent obtained prior to proceeding including risk of failure, 1% risk of PDPH, risk of minor discomfort and bruising.  Discussed rare but serious complications including epidural abscess, permanent nerve injury, epidural hematoma.  Discussed alternatives to epidural analgesia and patient desires to  proceed.  Timeout performed pre-procedure verifying patient name, procedure, and platelet count.  Patient tolerated procedure well. )      Anesthesia Quick Evaluation

## 2016-02-14 ENCOUNTER — Encounter (HOSPITAL_COMMUNITY): Payer: Self-pay | Admitting: *Deleted

## 2016-02-14 LAB — CBC
HEMATOCRIT: 30 % — AB (ref 36.0–46.0)
Hemoglobin: 10.2 g/dL — ABNORMAL LOW (ref 12.0–15.0)
MCH: 31.6 pg (ref 26.0–34.0)
MCHC: 34 g/dL (ref 30.0–36.0)
MCV: 92.9 fL (ref 78.0–100.0)
Platelets: 220 10*3/uL (ref 150–400)
RBC: 3.23 MIL/uL — ABNORMAL LOW (ref 3.87–5.11)
RDW: 12.6 % (ref 11.5–15.5)
WBC: 14.9 10*3/uL — ABNORMAL HIGH (ref 4.0–10.5)

## 2016-02-14 LAB — RPR: RPR Ser Ql: NONREACTIVE

## 2016-02-14 MED ORDER — DIBUCAINE 1 % RE OINT
1.0000 "application " | TOPICAL_OINTMENT | RECTAL | Status: DC | PRN
  Administered 2016-02-14: 1 via RECTAL
  Filled 2016-02-14: qty 28

## 2016-02-14 MED ORDER — ONDANSETRON HCL 4 MG/2ML IJ SOLN: 4.0000 mg | INTRAMUSCULAR | Status: DC | PRN

## 2016-02-14 MED ORDER — SIMETHICONE 80 MG PO CHEW: 80.0000 mg | CHEWABLE_TABLET | ORAL | Status: DC | PRN

## 2016-02-14 MED ORDER — TETANUS-DIPHTH-ACELL PERTUSSIS 5-2.5-18.5 LF-MCG/0.5 IM SUSP: 0.5000 mL | Freq: Once | INTRAMUSCULAR | Status: DC

## 2016-02-14 MED ORDER — LANOLIN HYDROUS EX OINT: TOPICAL_OINTMENT | CUTANEOUS | Status: DC | PRN

## 2016-02-14 MED ORDER — SENNOSIDES-DOCUSATE SODIUM 8.6-50 MG PO TABS
2.0000 | ORAL_TABLET | ORAL | Status: DC
  Administered 2016-02-14 – 2016-02-15 (×2): 2 via ORAL
  Filled 2016-02-14 (×2): qty 2

## 2016-02-14 MED ORDER — OXYCODONE HCL 5 MG PO TABS
5.0000 mg | ORAL_TABLET | ORAL | Status: DC | PRN
  Administered 2016-02-14 – 2016-02-15 (×4): 5 mg via ORAL
  Filled 2016-02-14 (×4): qty 1

## 2016-02-14 MED ORDER — PRENATAL MULTIVITAMIN CH
1.0000 | ORAL_TABLET | Freq: Every day | ORAL | Status: DC
  Administered 2016-02-14: 1 via ORAL
  Filled 2016-02-14: qty 1

## 2016-02-14 MED ORDER — OXYCODONE HCL 5 MG PO TABS: 10.0000 mg | ORAL_TABLET | ORAL | Status: DC | PRN

## 2016-02-14 MED ORDER — ONDANSETRON HCL 4 MG PO TABS: 4.0000 mg | ORAL_TABLET | ORAL | Status: DC | PRN

## 2016-02-14 MED ORDER — WITCH HAZEL-GLYCERIN EX PADS
1.0000 "application " | MEDICATED_PAD | CUTANEOUS | Status: DC | PRN
  Administered 2016-02-14: 1 via TOPICAL

## 2016-02-14 MED ORDER — BENZOCAINE-MENTHOL 20-0.5 % EX AERO
1.0000 "application " | INHALATION_SPRAY | CUTANEOUS | Status: DC | PRN
  Administered 2016-02-14: 1 via TOPICAL
  Filled 2016-02-14: qty 56

## 2016-02-14 MED ORDER — IBUPROFEN 600 MG PO TABS
600.0000 mg | ORAL_TABLET | Freq: Four times a day (QID) | ORAL | Status: DC
  Administered 2016-02-14 – 2016-02-15 (×6): 600 mg via ORAL
  Filled 2016-02-14 (×6): qty 1

## 2016-02-14 MED ORDER — ACETAMINOPHEN 325 MG PO TABS: 650.0000 mg | ORAL_TABLET | ORAL | Status: DC | PRN

## 2016-02-14 MED ORDER — DIPHENHYDRAMINE HCL 25 MG PO CAPS: 25.0000 mg | ORAL_CAPSULE | Freq: Four times a day (QID) | ORAL | Status: DC | PRN

## 2016-02-14 NOTE — Progress Notes (Signed)
Patient is eating, ambulating, voiding.  Pain control is good.  Filed Vitals:   02/14/16 0003 02/14/16 0040 02/14/16 0210 02/14/16 0615  BP: 107/59 123/66 90/75 99/45   Pulse: 63 61 77 62  Temp:  98.5 F (36.9 C) 97.8 F (36.6 C) 98.6 F (37 C)  TempSrc:  Oral Oral Oral  Resp: 18 20 20 18   Height:      Weight:      SpO2:        Fundus firm Perineum without swelling.  Lab Results  Component Value Date   WBC 14.9* 02/14/2016   HGB 10.2* 02/14/2016   HCT 30.0* 02/14/2016   MCV 92.9 02/14/2016   PLT 220 02/14/2016    --/--/O POS, O POS (04/10 1350)/RI  A/P Post partum day 1.  Routine care.  Expect d/c routine.    Venida Tsukamoto A

## 2016-02-14 NOTE — Anesthesia Postprocedure Evaluation (Signed)
Anesthesia Post Note  Patient: Chloe Pittman  Procedure(s) Performed: * No procedures listed *  Patient location during evaluation: Mother Baby Anesthesia Type: Epidural Level of consciousness: awake and alert Pain management: pain level controlled Vital Signs Assessment: post-procedure vital signs reviewed and stable Respiratory status: spontaneous breathing, nonlabored ventilation and respiratory function stable Cardiovascular status: stable Postop Assessment: no headache, no backache and epidural receding Anesthetic complications: no    Last Vitals:  Filed Vitals:   02/14/16 0210 02/14/16 0615  BP: 90/75 99/45  Pulse: 77 62  Temp: 36.6 C 37 C  Resp: 20 18    Last Pain:  Filed Vitals:   02/14/16 0701  PainSc: 4                  Meaghann Choo

## 2016-02-14 NOTE — Lactation Note (Signed)
This note was copied from a baby's chart. Lactation Consultation Note New mom BF great. BF in cross cradle position. Has everted nipple, compressible  breast and areola, baby latches well and obtains a deep latch. Hand expression taught w/easily expressed colostrum. Mom encouraged to feed baby 8-12 times/24 hours and with feeding cues. Referred to Baby and Me Book in Breastfeeding section Pg. 22-23 for position options and Proper latch demonstration.Mom reports + breast changes w/pregnancy. Educated about newborn behavior, STS,I&O, cluster feeding, supply and demand. Mom and dad had a lot of questions. Mom has personal DEBP. Major brochure given w/resources, support groups and [place] services. Patient Name: Chloe Pittman Today's Date: 02/14/2016 Reason for consult: Initial assessment   Maternal Data Has patient been taught Hand Expression?: Yes Does the patient have breastfeeding experience prior to this delivery?: No  Feeding Feeding Type: Breast Fed Length of feed: 25 min  LATCH Score/Interventions Latch: Grasps breast easily, tongue down, lips flanged, rhythmical sucking.  Audible Swallowing: A few with stimulation Intervention(s): Skin to skin;Hand expression;Alternate breast massage  Type of Nipple: Everted at rest and after stimulation  Comfort (Breast/Nipple): Soft / non-tender     Hold (Positioning): Assistance needed to correctly position infant at breast and maintain latch. Intervention(s): Breastfeeding basics reviewed;Support Pillows;Position options;Skin to skin  LATCH Score: 8  Lactation Tools Discussed/Used WIC Program: No   Consult Status Consult Status: Follow-up Date: 02/15/16 Follow-up type: In-patient    Theodoro Kalata 02/14/2016, 4:38 AM

## 2016-02-15 NOTE — Lactation Note (Addendum)
This note was copied from a baby's chart. Lactation Consultation Note  Patient Name: Girl Lusila Matsuo [id number] Date: 02/15/2016 Reason for consult: Follow-up assessment Baby 12 hours old. Mom reports some nipple pain on right breast. Nipple is abraded. Assisted mom to latch baby in football position and able to achieve/maintain a deeper latch. Mom reports increased comfort. Enc use of EBM on nipple between feedings. Referred parents to Baby and Me booklet for number of diapers to expect by day of life, and reviewed reasons to call pediatrician. Mom aware of OP/BFSG and [place] phone line assistance after D/C.   Maternal Data    Feeding Feeding Type: Breast Fed Length of feed:  (LC assessed first 10 minutes of BF.)  LATCH Score/Interventions Latch: Grasps breast easily, tongue down, lips flanged, rhythmical sucking.  Audible Swallowing: Spontaneous and intermittent Intervention(s): Skin to skin;Hand expression  Type of Nipple: Everted at rest and after stimulation  Comfort (Breast/Nipple): Filling, red/small blisters or bruises, mild/mod discomfort  Problem noted: Mild/Moderate discomfort  Hold (Positioning): Assistance needed to correctly position infant at breast and maintain latch. Intervention(s): Breastfeeding basics reviewed;Support Pillows;Position options;Skin to skin  LATCH Score: 8  Lactation Tools Discussed/Used     Consult Status Consult Status: PRN    Inocente Salles 02/15/2016, 9:15 AM

## 2016-02-15 NOTE — Discharge Summary (Signed)
Obstetric Discharge Summary Reason for Admission: onset of labor Prenatal Procedures: ultrasound Intrapartum Procedures: spontaneous vaginal delivery Postpartum Procedures: none Complications-Operative and Postpartum: vaginal laceration HEMOGLOBIN  Date Value Ref Range Status  02/14/2016 10.2* 12.0 - 15.0 g/dL Final    Comment:    DELTA CHECK NOTED REPEATED TO VERIFY    HCT  Date Value Ref Range Status  02/14/2016 30.0* 36.0 - 46.0 % Final    Physical Exam:  General: alert and cooperative Lochia: appropriate Uterine Fundus: firm DVT Evaluation: No evidence of DVT seen on physical exam.  Discharge Diagnoses: Term Pregnancy-delivered  Discharge Information: Date: 02/15/2016 Activity: pelvic rest Diet: routine Medications: PNV and Ibuprofen Condition: stable Instructions: refer to practice specific booklet Discharge to: home Follow-up Information    Follow up with [facility] GEFFEL Carlis Abbott, MD In 4 weeks.   Specialty:  Obstetrics   Contact information:   [place] [address] [phone]       Newborn Data: Live born female  Birth Weight: 8 lb 11.9 oz (3966 g) APGAR: 9, 9  Home with mother.  Chloe Pittman 02/15/2016, 9:27 AM

## 2016-02-17 ENCOUNTER — Inpatient Hospital Stay (HOSPITAL_COMMUNITY): Admit: 2016-02-17 | Payer: BLUE CROSS/BLUE SHIELD

## 2016-02-24 ENCOUNTER — Telehealth (HOSPITAL_COMMUNITY): Payer: Self-pay

## 2016-02-24 ENCOUNTER — Ambulatory Visit (HOSPITAL_COMMUNITY)
Admission: RE | Admit: 2016-02-24 | Discharge: 2016-02-24 | Disposition: A | Discharge status: Home / Self Care | Payer: BLUE CROSS/BLUE SHIELD | Source: Ambulatory Visit | Attending: Obstetrics and Gynecology | Admitting: Obstetrics and Gynecology

## 2016-02-24 NOTE — Telephone Encounter (Signed)
Infant 54 days old. Feeds for 10-20 minutes pauses and then eats again for 10 minutes.  Wants to eat again 10 minutes later. Reports breast softening with feedings. Eats more than 14 times a day. Sore cracked nipples.  Voids 5-6 wet diapers. Half of them are noticeably wet. Stools about 2 a day. Recommended OP appointment for pre and post weights and positioning check.  Pt wants to call her insurance company to see if visit is covered.

## 2016-02-24 NOTE — Lactation Note (Signed)
Lactation Consult  Mother's reason for visit: per mom Help with latching , sleeping , and every thing  Visit Type:  Feeding assessment  Appointment Notes:  Pt. Confirmed appt. For today 4/21  Consult:  Initial Lactation Consultant:  Myer Haff  ________________________________________________________________________ _____________________________________________________________________  Mother's Name: Chloe Pittman Type of delivery:  Vaginal Delivery  Breastfeeding Experience:  1st baby  Maternal Medical Conditions:  No risk  Maternal Medications: PNV   ________________________________________________________________________  Breastfeeding History (Post Discharge)  See note below   Infant Intake and Output Assessment  Voids:  6 + in 24 hrs.  Color:  Clear yellow Stools:  4 + in 24 hrs.  Color:  Yellow  ________________________________________________________________________  Maternal Breast Assessment - mom appears at the [facility] consult mentioning she is exhausted and LC noted also dark circles around her eyes.  Per mom the baby has been eating non - stop around the clock. ( 10 -15 times day or more for 10 -15 mins to 20 -40 mins a feeding) Last feed at 1210 - 1225 pm . Right breast.   Breast:  Full Nipple:  Erect Pain level:  0 right breast . Left small crack noted upper edge of nipple ( reopened while pumping ) - per mom sore , and slow to heal  Pain interventions:  Bra, Comfort gels, Expressed breast milk and Inverted shells  _______________________________________________________________________ Feeding Assessment/Evaluation  Initial feeding assessment:  Infant's oral assessment:  WNL  Positioning:  Football Right breast  LATCH documentation:  Latch:  2 = Grasps breast easily, tongue down, lips flanged, rhythmical sucking.  Audible swallowing:  2 = Spontaneous and intermittent  Type of nipple:  2 = Everted at rest and after stimulation  Comfort  (Breast/Nipple):  1 = Filling, red/small blisters or bruises, mild/mod discomfort  Hold (Positioning):  1 = Assistance needed to correctly position infant at breast and maintain latch  LATCH score:  8  Attached assessment:  Shallow at 1st   Lips flanged:  Yes.    Lips untucked:  Yes.    Suck assessment:  Nutritive  Tools:  None for latch  Instructed on use and cleaning of tool:  Yes.    Pre-feed weight: 4258 g , 9-6.2 oz  Post-feed weight: 4274 g , 9-6.8 oz  Amount transferred:  16 ml  Amount supplemented: none   Additional Feeding Assessment -   Infant's oral assessment:  WNL  Positioning:  Football Right breast  LATCH documentation:  Latch:  2 = Grasps breast easily, tongue down, lips flanged, rhythmical sucking.  Audible swallowing:  2 = Spontaneous and intermittent  Type of nipple:  2 = Everted at rest and after stimulation  Comfort (Breast/Nipple):  1 = Filling, red/small blisters or bruises, mild/mod discomfort  Hold (Positioning):  1 = Assistance needed to correctly position infant at breast and maintain latch  LATCH score:  8   Attached assessment:  Shallow  Lips flanged:  Yes.    Lips untucked:  Yes.    Suck assessment:  Nutritive and Nonnutritive  Tools:  Supplemental nutrition system Instructed on use and cleaning of tool:  Yes.    Pre-feed weight:  4274 g , 9-6.8 oz  Post-feed weight:  4296 g , 9-7.5 oz  Amount transferred:  22 ml   Amount supplemented:  None    Lactation Impression:  Baby Piper still fussy after feeding both breast and transferring off 38 ml  Discussed with mom 1st trying to latch on the left breast ,  and sue to sore ness  Mom declined. 2nd option discussed to re- latch on the right breast and add a SNS With EBM mom had pumped off the left breast ( 15 ml)  Baby re- latched and tolerated SNS well. Re-weight after feeding was not done.  Per mom I'm so exhausted and I just want her to settle down after feeding.  LC discussed with mom  due to her exhaustion - probably has been effecting her Let - Down By decreasing volume and that's why the baby is hungry and wants to feed.  LC recommended using the SNS with every feeding until the left nipple is healed.  And stressed the importance of pumping the left breast with a #27 Flange so healing will take place,  Along with using the shells , comfort gels . And if the soreness isn't improved in 4 days to call [facility] office back for F/U.   Total amount pumped post feed:  Pumped only the left with 87 ml EBM yield )  - Milk supply is at a good level for 11 day  out 125 ml off breast between feeding and pumping.   Total amount transferred:  38 ml  Total supplement given:  15 ml  Total for feeding: 53 ml   Lactation Plan of Care:  Praised mom for her efforts and grandmother support  Feedings - with feeding cues 2-3 hours  Cluster feeding normal with growth spurts Use SNS with every latch until more satisfied and able to latch on the left breast  Steps for latching - wait for Piper to open wide and breast compressions with latch until swallows and then intermittent . Sore nipple tx - comfort gels alternating with breast shells ( comfort gels x 6 days once opened) EBM to nipples liberally  Increase flange for left breast to #27 until healed  Left breast needs to be pumped down every feeding - use with SNS to supplement back to baby

## 2016-05-01 ENCOUNTER — Telehealth (HOSPITAL_COMMUNITY): Payer: Self-pay | Admitting: Lactation Services

## 2016-05-01 NOTE — Telephone Encounter (Signed)
Mom of 69 month old baby calling with concern of pumping less milk.  Baby breastfeeds well and content after feedings.  Baby was gaining well at 2 month check up.  Mom states she likes to pump 4 ounces per day so the FOB can give a night time bottle.  She is now only able to obtain 15-30 mls each post pumping.  Discussed that as baby grows and requires more volume it can be difficult to produce large volumes pumping.  Mom talking about taking herbal supplements and plans on starting them today.  I recommended support group for a reassurance weight check and support.

## 2016-08-22 DIAGNOSIS — F3289 Other specified depressive episodes: Secondary | ICD-10-CM | POA: Diagnosis not present

## 2016-08-22 DIAGNOSIS — F4322 Adjustment disorder with anxiety: Secondary | ICD-10-CM | POA: Diagnosis not present

## 2016-09-04 DIAGNOSIS — K644 Residual hemorrhoidal skin tags: Secondary | ICD-10-CM | POA: Diagnosis not present

## 2016-09-19 DIAGNOSIS — R151 Fecal smearing: Secondary | ICD-10-CM | POA: Diagnosis not present

## 2016-09-19 DIAGNOSIS — K644 Residual hemorrhoidal skin tags: Secondary | ICD-10-CM | POA: Diagnosis not present

## 2017-01-31 DIAGNOSIS — H7392 Unspecified disorder of tympanic membrane, left ear: Secondary | ICD-10-CM | POA: Diagnosis not present

## 2017-01-31 DIAGNOSIS — H6983 Other specified disorders of Eustachian tube, bilateral: Secondary | ICD-10-CM | POA: Diagnosis not present

## 2017-02-06 ENCOUNTER — Other Ambulatory Visit: Payer: Self-pay | Admitting: Obstetrics and Gynecology

## 2017-02-06 DIAGNOSIS — Z01419 Encounter for gynecological examination (general) (routine) without abnormal findings: Secondary | ICD-10-CM | POA: Diagnosis not present

## 2017-02-06 DIAGNOSIS — Z6824 Body mass index (BMI) 24.0-24.9, adult: Secondary | ICD-10-CM | POA: Diagnosis not present

## 2017-02-06 DIAGNOSIS — Z124 Encounter for screening for malignant neoplasm of cervix: Secondary | ICD-10-CM | POA: Diagnosis not present

## 2017-02-08 LAB — CYTOLOGY - PAP

## 2017-03-19 DIAGNOSIS — L700 Acne vulgaris: Secondary | ICD-10-CM | POA: Diagnosis not present

## 2017-03-19 DIAGNOSIS — L858 Other specified epidermal thickening: Secondary | ICD-10-CM | POA: Diagnosis not present

## 2017-03-19 DIAGNOSIS — L905 Scar conditions and fibrosis of skin: Secondary | ICD-10-CM | POA: Diagnosis not present

## 2017-03-19 DIAGNOSIS — L906 Striae atrophicae: Secondary | ICD-10-CM | POA: Diagnosis not present

## 2017-03-20 ENCOUNTER — Encounter: Payer: Self-pay | Admitting: Gynecology

## 2017-05-27 ENCOUNTER — Ambulatory Visit: Payer: Self-pay | Admitting: Surgery

## 2017-05-27 DIAGNOSIS — Z01818 Encounter for other preprocedural examination: Secondary | ICD-10-CM | POA: Diagnosis not present

## 2017-05-27 DIAGNOSIS — K641 Second degree hemorrhoids: Secondary | ICD-10-CM | POA: Diagnosis not present

## 2017-05-27 DIAGNOSIS — K644 Residual hemorrhoidal skin tags: Secondary | ICD-10-CM | POA: Diagnosis not present

## 2017-08-08 DIAGNOSIS — J069 Acute upper respiratory infection, unspecified: Secondary | ICD-10-CM | POA: Diagnosis not present

## 2017-10-21 DIAGNOSIS — W460XXA Contact with hypodermic needle, initial encounter: Secondary | ICD-10-CM | POA: Diagnosis not present

## 2017-10-21 DIAGNOSIS — J069 Acute upper respiratory infection, unspecified: Secondary | ICD-10-CM | POA: Diagnosis not present

## 2018-01-08 DIAGNOSIS — Z0000 Encounter for general adult medical examination without abnormal findings: Secondary | ICD-10-CM | POA: Diagnosis not present

## 2018-03-06 DIAGNOSIS — Z8041 Family history of malignant neoplasm of ovary: Secondary | ICD-10-CM | POA: Diagnosis not present

## 2018-03-06 DIAGNOSIS — Z803 Family history of malignant neoplasm of breast: Secondary | ICD-10-CM | POA: Diagnosis not present

## 2018-03-06 DIAGNOSIS — Z6823 Body mass index (BMI) 23.0-23.9, adult: Secondary | ICD-10-CM | POA: Diagnosis not present

## 2018-03-06 DIAGNOSIS — Z01419 Encounter for gynecological examination (general) (routine) without abnormal findings: Secondary | ICD-10-CM | POA: Diagnosis not present

## 2018-03-06 DIAGNOSIS — Z8042 Family history of malignant neoplasm of prostate: Secondary | ICD-10-CM | POA: Diagnosis not present

## 2018-03-06 DIAGNOSIS — Z800 Family history of malignant neoplasm of digestive organs: Secondary | ICD-10-CM | POA: Diagnosis not present

## 2018-03-18 ENCOUNTER — Ambulatory Visit: Payer: Self-pay | Admitting: Surgery

## 2018-03-18 DIAGNOSIS — K641 Second degree hemorrhoids: Secondary | ICD-10-CM | POA: Diagnosis not present

## 2018-03-18 DIAGNOSIS — Z01818 Encounter for other preprocedural examination: Secondary | ICD-10-CM | POA: Diagnosis not present

## 2018-03-18 DIAGNOSIS — K644 Residual hemorrhoidal skin tags: Secondary | ICD-10-CM | POA: Diagnosis not present

## 2018-03-18 NOTE — H&P (Signed)
Chloe Pittman Documented: 03/18/2018 11:51 AM Location: [facility] Patient #: [id number] DOB: June 28, 1986 Married / Language: Chloe Pittman / Race: White Female  History of Present Illness [name] MD; 03/18/2018 12:54 PM) The patient is a 32 year old female who presents with hemorrhoids. Note for "Hemorrhoids": ` ` ` Patient sent for surgical consultation at the request of Dr. Philis Pique  Chief Complaint: Hemorrhoids  The patient is a pleasant healthy female. She notes that she had worsening hemorrhoid initials with the third trimester of pregnancy 2017-2018. Had some anal swelling. It calmed down after delivering the child but she keeps getting issues of irritation. Hard to keep the area clean. Frustrated. She was referred to Dr. Earlean Shawl with gastroenterology. He did not find any internal problems to band. She had much of an external hemorrhoid. Try to get reassurance. I'll noted flares with discomfort and irritation. She's had a couple episodes of pain after bowel movements. She was concerned. Surgical consultation made for second opinion. I saw her last year in offered hemorrhoidal ligation and pexy with external hemorrhoidectomy. She was leaning toward surgery but ultimately canceled.  Has been a year. She still struggling with intermittent irritation and discomfort. On a daily basis down. She claims she moves her bowels every day. Main issues of feels very itchy a raw. Has to wipe constantly. Frustrating and a nuisance. No severe pain. No major bleeding. Patient claims she moves her bowels about once or twice a day. She has yet to try a fiber supplement. She's never had any anorectal interventions. She does not smoke. She is not on blood thinners. No cardiac issues.  No personal nor family history of GI/colon cancer, inflammatory bowel disease, irritable bowel syndrome, allergy such as Celiac Sprue, dietary/dairy problems, colitis, ulcers nor gastritis.  No recent sick contacts/gastroenteritis. No travel outside the country. No changes in diet. No dysphagia to solids or liquids. No significant heartburn or reflux. No hematochezia, hematemesis, coffee ground emesis. No evidence of prior gastric/peptic ulceration.  (Review of systems as stated in this history (HPI) or in the review of systems. Otherwise all other 12 point ROS are negative)   Allergies ([name], [place]; 03/18/2018 11:52 AM) Sulfa Antibiotics Nausea. Allergies Reconciled  Medication History ([name], [place]; 03/18/2018 11:52 AM) Sertraline HCl (50MG  Tablet, Oral) Active. Proctosol HC (2.5% Cream, 1 (one) Rectal four times daily, as needed, Taken starting 05/27/2017) Active. Medications Reconciled     Review of Systems [name], MD; 03/18/2018 12:19 PM) General Not Present- Appetite Loss, Chills, Fatigue, Fever, Night Sweats, Weight Gain and Weight Loss. Skin Not Present- Change in Wart/Mole, Dryness, Hives, Jaundice, New Lesions, Non-Healing Wounds, Rash and Ulcer. HEENT Not Present- Earache, Hearing Loss, Hoarseness, Nose Bleed, Oral Ulcers, Ringing in the Ears, Seasonal Allergies, Sinus Pain, Sore Throat, Visual Disturbances, Wears glasses/contact lenses and Yellow Eyes. Respiratory Not Present- Bloody sputum, Chronic Cough, Difficulty Breathing, Snoring and Wheezing. Breast Not Present- Breast Mass, Breast Pain, Nipple Discharge and Skin Changes. Cardiovascular Not Present- Chest Pain, Difficulty Breathing Lying Down, Leg Cramps, Palpitations, Rapid Heart Rate, Shortness of Breath and Swelling of Extremities. Gastrointestinal Present- Hemorrhoids. Not Present- Abdominal Pain, Bloating, Bloody Stool, Change in Bowel Habits, Chronic diarrhea, Constipation, Difficulty Swallowing, Excessive gas, Gets full quickly at meals, Indigestion, Nausea, Rectal Pain and Vomiting. Female Genitourinary Not Present- Frequency, Nocturia, Painful Urination, Pelvic  Pain and Urgency. Musculoskeletal Present- Back Pain. Not Present- Joint Pain, Joint Stiffness, Muscle Pain, Muscle Weakness and Swelling of Extremities. Neurological  Not Present- Decreased Memory, Fainting, Headaches, Numbness, Seizures, Tingling, Tremor, Trouble walking and Weakness. Endocrine Present- Hair Changes. Not Present- Cold Intolerance, Excessive Hunger, Heat Intolerance, Hot flashes and New Diabetes. Hematology Not Present- Blood Thinners, Easy Bruising, Excessive bleeding, Gland problems, HIV and Persistent Infections.  Vitals (Tanisha A. Brown RMA; 03/18/2018 11:52 AM) 03/18/2018 11:51 AM Weight: 159.8 lb Height: 70in Body Surface Area: 1.9 m Body Mass Index: 22.93 kg/m  Temp.: 98.45F  Pulse: 80 (Regular)  BP: 116/68 (Sitting, Left Arm, Standard)      Physical Exam [name] MD; 03/18/2018 12:26 PM)  General Mental Status-Alert. General Appearance-Not in acute distress, Not Sickly. Orientation-Oriented X3. Hydration-Well hydrated. Voice-Normal.  Integumentary Global Assessment Normal Exam - Axillae: non-tender, no inflammation or ulceration, no drainage. and Distribution of scalp and body hair is normal. General Characteristics Temperature - normal warmth is noted.  Head and Neck Head-normocephalic, atraumatic with no lesions or palpable masses. Face Global Assessment - atraumatic, no absence of expression. Neck Global Assessment - no abnormal movements, no bruit auscultated on the right, no bruit auscultated on the left, no decreased range of motion, non-tender. Trachea-midline. Thyroid Gland Characteristics - non-tender.  Eye Eyeball - Left-Extraocular movements intact, No Nystagmus. Eyeball - Right-Extraocular movements intact, No Nystagmus. Cornea - Left-No Hazy. Cornea - Right-No Hazy. Sclera/Conjunctiva - Left-No scleral icterus, No Discharge. Sclera/Conjunctiva - Right-No scleral icterus, No  Discharge. Pupil - Left-Direct reaction to light normal. Pupil - Right-Direct reaction to light normal.  ENMT Ears Pinna - Left - no drainage observed, no generalized tenderness observed. Right - no drainage observed, no generalized tenderness observed. Nose and Sinuses Nose - no destructive lesion observed. Nares - Left - quiet respiration. Right - quiet respiration. Mouth and Throat Lips - Upper Lip - no fissures observed, no pallor noted. Lower Lip - no fissures observed, no pallor noted. Nasopharynx - no discharge present. Oral Cavity/Oropharynx - Tongue - no dryness observed. Oral Mucosa - no cyanosis observed. Hypopharynx - no evidence of airway distress observed.  Chest and Lung Exam Inspection Movements - Normal and Symmetrical. Accessory muscles - No use of accessory muscles in breathing. Palpation Normal exam - Non-tender. Auscultation Breath sounds - Normal and Clear.  Cardiovascular Auscultation Rhythm - Regular. Murmurs & Other Heart Sounds - Normal exam - No Murmurs and No Systolic Clicks.  Abdomen Inspection Normal Exam - No Visible peristalsis and No Abnormal pulsations. Umbilicus - No Bleeding, No Urine drainage. Palpation/Percussion Normal exam - Soft, Non Tender, No Rebound tenderness, No Rigidity (guarding) and No Cutaneous hyperesthesia. Note: Abdomen soft. Not severely distended. No distasis recti. No umbilical or other anterior abdominal wall hernias  Female Genitourinary Sexual Maturity Tanner 5 - Adult hair pattern. Note: No vaginal bleeding nor discharge  Rectal Note: ` ` ` Left anterior midline perianal external hemorrhoid/skin fold.   Perianal skin clean with good hygiene. No pruritis ani. No pilonidal disease. No fissure. No abscess/fistula. Normal sphincter tone.  No external hemorrhoids. No condyloma warts. Tolerates digital and anoscopic rectal exam. Grade 2 hemorrhoids, esp R posterior & L lateral. Mildly enlarged anal  columns but no major inflammation. No active bleeding. No rectal masses. Hemorrhoidal piles normal. Exam done with assistance of female CMA in the room.  Peripheral Vascular Upper Extremity Inspection - Left - No Cyanotic nailbeds, Not Ischemic. Right - No Cyanotic nailbeds, Not Ischemic.  Neurologic Neurologic evaluation reveals -normal attention span and ability to concentrate, able to name objects and repeat phrases. Appropriate fund of knowledge ,  normal sensation and normal coordination. Mental Status Affect - not angry, not paranoid. Cranial Nerves-Normal Bilaterally. Gait-Normal.  Neuropsychiatric Mental status exam performed with findings of-able to articulate well with normal speech/language, rate, volume and coherence, thought content normal with ability to perform basic computations and apply abstract reasoning and no evidence of hallucinations, delusions, obsessions or homicidal/suicidal ideation.  Musculoskeletal Global Assessment Spine, Ribs and Pelvis - no instability, subluxation or laxity. Right Upper Extremity - no instability, subluxation or laxity.  Lymphatic Head & Neck General Head & Neck Lymphatics: Bilateral - Description - No Localized lymphadenopathy. Axillary General Axillary Region: Bilateral - Description - No Localized lymphadenopathy. Femoral & Inguinal Generalized Femoral & Inguinal Lymphatics: Left: Right - Description - No Localized lymphadenopathy. Description - No Localized lymphadenopathy.   Results [name] MD; 03/18/2018 12:51 PM) Procedures  Name Value Date Hemorrhoids Procedure Anal exam: External Hemorrhoid Internal exam: Internal Hemorroids ( non-bleeding) Other: `......`......`....Marland KitchenMarland KitchenLeft anterior midline perianal external hemorrhoid/skin fold. ...........Marland KitchenPerianal skin clean with good hygiene. No pruritis ani. No pilonidal disease. No fissure. No abscess/fistula. Normal sphincter tone.  ....Marland KitchenMarland KitchenNo external hemorrhoids. No condyloma warts. Tolerates digital and anoscopic rectal exam. Grade 2 hemorrhoids, esp R posterior & L lateral. Mildly enlarged anal columns but no major inflammation. No active bleeding. No rectal masses. Hemorrhoidal piles normal. Exam done with assistance of female CMA in the room.  Performed: 03/18/2018 12:27 PM    Assessment & Plan [name] MD; 03/18/2018 12:51 PM)  EXTERNAL HEMORRHOIDS WITH COMPLICATION (F81.0) Impression: Small but very irritating anterior external hemorrhoid. I strongly suspect she has some occasionally prolapsing internal hemorrhoids as well since external anatomy does not look that horrible yet struggling with irritation and soiling. I again offered examination under anesthesia with internal hemorrhoidal ligation and pexy and an external hemorrhoidectomy of the obvious anterior hemorrhoid pile. I would give the best shot at getting her anatomy more normal better place. She is scared of having postoperative pain but understands that should be short lived and get much improved after the first week or so.  The anatomy & physiology of the anorectal region was discussed. The pathophysiology of hemorrhoids and differential diagnosis was discussed. Natural history progression was discussed. I stressed the importance of a bowel regimen to have daily soft bowel movements to minimize progression of disease. Goal of one BM / day ideal. Use of wet wipes, warm baths, avoiding straining, etc were emphasized.  Educational handouts further explaining the pathology, treatment options, and bowel regimen were given as well. The patient expressed understanding.    I did caution her that she will be out several weeks with pain issues and that she cannot independently manage her toddler all by herself. She understands that and will work to coordinate help  Current Plans You are being scheduled for surgery- Our schedulers will call  you.  You should hear from our office's scheduling department within 5 working days about the location, date, and time of surgery. We try to make accommodations for patient's preferences in scheduling surgery, but sometimes the OR schedule or the surgeon's schedule prevents [place] from making those accommodations.  If you have not heard from our office [phone]) in 5 working days, call the office and ask for your surgeon's nurse.  If you have other questions about your diagnosis, plan, or surgery, call the office and ask for your surgeon's nurse.  The anatomy & physiology of the anorectal region was discussed. The pathophysiology of hemorrhoids and differential diagnosis was discussed. Natural history risks  without surgery was discussed. I stressed the importance of a bowel regimen to have daily soft bowel movements to minimize progression of disease. Interventions such as sclerotherapy & banding were discussed.  The patient's symptoms are not adequately controlled by medicines and other non-operative treatments. I feel the risks & problems of no surgery outweigh the operative risks; therefore, I recommended surgery to treat the hemorrhoids by ligation, pexy, and possible resection.  Risks such as bleeding, infection, urinary difficulties, need for further treatment, heart attack, death, and other risks were discussed. I noted a good likelihood this will help address the problem. Goals of post-operative recovery were discussed as well. Possibility that this will not correct all symptoms was explained. Post-operative pain, bleeding, constipation, and other problems after surgery were discussed. We will work to minimize complications. Educational handouts further explaining the pathology, treatment options, and bowel regimen were given as well. Questions were answered. The patient expresses understanding & wishes to proceed with surgery.   PROLAPSED INTERNAL HEMORRHOIDS, GRADE 2  (K64.1)  Current Plans ANOSCOPY, DIAGNOSTIC (82500) Pt Education - [facility] Hemorrhoids (Beena Catano): discussed with patient and provided information.  ENCOUNTER FOR PREOPERATIVE EXAMINATION FOR GENERAL SURGICAL PROCEDURE (Z01.818)  Current Plans Pt Education - [facility] Rectal Prep for Anorectal outpatient/office surgery: discussed with patient and provided information. Pt Education - [facility] Rectal Surgery HCI (Maymie Brunke): discussed with patient and provided information.  [name], M.D., F.A.C.S. Gastrointestinal and Minimally Invasive Surgery [facility] [address], [place], [place] [address] [phone] Main / Paging

## 2018-04-21 ENCOUNTER — Encounter (HOSPITAL_COMMUNITY): Payer: Self-pay | Admitting: *Deleted

## 2018-05-07 NOTE — Patient Instructions (Addendum)
Chloe Pittman  05/07/2018   Your procedure is scheduled on: 05-14-18   Report to [facility] Main  Entrance    Report to Admitting at 6:30 AM    Call this number if you have problems the morning of surgery [phone]   Remember: Do not eat food or drink liquids : AFTER MIDNIGHT      Take these medicines the morning of surgery with A SIP OF WATER: Sertraline (Zoloft)                                You may not have any metal on your body including hair pins and              piercings  Do not wear jewelry, make-up, lotions, powders or perfumes, deodorant             Do not wear nail polish.  Do not shave  48 hours prior to surgery.               Do not bring valuables to the hospital. [place].  Contacts, dentures or bridgework may not be worn into surgery.      Patients discharged the day of surgery will not be allowed to drive home.  Name and phone number of your driver: Othel Dicostanzo [phone]     Special Instructions: Please follow your bowel prep per your surgeon's instructions             Please read over the following fact sheets you were given: _____________________________________________________________________             [facility] - Preparing for Surgery Before surgery, you can play an important role.  Because skin is not sterile, your skin needs to be as free of germs as possible.  You can reduce the number of germs on your skin by washing with CHG (chlorahexidine gluconate) soap before surgery.  CHG is an antiseptic cleaner which kills germs and bonds with the skin to continue killing germs even after washing. Please DO NOT use if you have an allergy to CHG or antibacterial soaps.  If your skin becomes reddened/irritated stop using the CHG and inform your nurse when you arrive at Short Stay. Do not shave (including legs and underarms) for at least 48 hours prior to the first CHG  shower.  You may shave your face/neck. Please follow these instructions carefully:  1.  Shower with CHG Soap the night before surgery and the  morning of Surgery.  2.  If you choose to wash your hair, wash your hair first as usual with your  normal  shampoo.  3.  After you shampoo, rinse your hair and body thoroughly to remove the  shampoo.                           4.  Use CHG as you would any other liquid soap.  You can apply chg directly  to the skin and wash                       Gently with a scrungie or clean washcloth.  5.  Apply the CHG Soap to  your body ONLY FROM THE NECK DOWN.   Do not use on face/ open                           Wound or open sores. Avoid contact with eyes, ears mouth and genitals (private parts).                       Wash face,  Genitals (private parts) with your normal soap.             6.  Wash thoroughly, paying special attention to the area where your surgery  will be performed.  7.  Thoroughly rinse your body with warm water from the neck down.  8.  DO NOT shower/wash with your normal soap after using and rinsing off  the CHG Soap.                9.  Pat yourself dry with a clean towel.            10.  Wear clean pajamas.            11.  Place clean sheets on your bed the night of your first shower and do not  sleep with pets. Day of Surgery : Do not apply any lotions/deodorants the morning of surgery.  Please wear clean clothes to the hospital/surgery center.  FAILURE TO FOLLOW THESE INSTRUCTIONS MAY RESULT IN THE CANCELLATION OF YOUR SURGERY PATIENT SIGNATURE_________________________________  NURSE SIGNATURE__________________________________  ________________________________________________________________________

## 2018-05-09 ENCOUNTER — Other Ambulatory Visit: Payer: Self-pay

## 2018-05-09 ENCOUNTER — Encounter (HOSPITAL_COMMUNITY)
Admission: RE | Admit: 2018-05-09 | Discharge: 2018-05-09 | Disposition: A | Discharge status: Home / Self Care | Payer: BLUE CROSS/BLUE SHIELD | Source: Ambulatory Visit | Attending: Surgery | Admitting: Surgery

## 2018-05-09 ENCOUNTER — Encounter (HOSPITAL_COMMUNITY): Payer: Self-pay

## 2018-05-09 DIAGNOSIS — Z01812 Encounter for preprocedural laboratory examination: Secondary | ICD-10-CM | POA: Diagnosis not present

## 2018-05-09 DIAGNOSIS — K648 Other hemorrhoids: Secondary | ICD-10-CM | POA: Diagnosis not present

## 2018-05-09 LAB — CBC
HCT: 38.1 % (ref 36.0–46.0)
Hemoglobin: 12.6 g/dL (ref 12.0–15.0)
MCH: 30.8 pg (ref 26.0–34.0)
MCHC: 33.1 g/dL (ref 30.0–36.0)
MCV: 93.2 fL (ref 78.0–100.0)
PLATELETS: 281 10*3/uL (ref 150–400)
RBC: 4.09 MIL/uL (ref 3.87–5.11)
RDW: 12.8 % (ref 11.5–15.5)
WBC: 7.8 10*3/uL (ref 4.0–10.5)

## 2018-05-09 LAB — PREGNANCY, URINE: Preg Test, Ur: NEGATIVE

## 2018-05-13 MED ORDER — BUPIVACAINE LIPOSOME 1.3 % IJ SUSP
20.0000 mL | INTRAMUSCULAR | Status: DC
  Filled 2018-05-13: qty 20

## 2018-05-14 ENCOUNTER — Encounter (HOSPITAL_COMMUNITY)
Admission: RE | Disposition: A | Discharge status: Home / Self Care | Payer: Self-pay | Source: Ambulatory Visit | Attending: Surgery

## 2018-05-14 ENCOUNTER — Encounter (HOSPITAL_COMMUNITY): Payer: Self-pay

## 2018-05-14 ENCOUNTER — Ambulatory Visit (HOSPITAL_COMMUNITY): Payer: BLUE CROSS/BLUE SHIELD | Admitting: Anesthesiology

## 2018-05-14 ENCOUNTER — Ambulatory Visit (HOSPITAL_COMMUNITY)
Admission: RE | Admit: 2018-05-14 | Discharge: 2018-05-14 | Disposition: A | Discharge status: Home / Self Care | Payer: BLUE CROSS/BLUE SHIELD | Source: Ambulatory Visit | Attending: Surgery | Admitting: Surgery

## 2018-05-14 DIAGNOSIS — K641 Second degree hemorrhoids: Secondary | ICD-10-CM | POA: Insufficient documentation

## 2018-05-14 DIAGNOSIS — K642 Third degree hemorrhoids: Secondary | ICD-10-CM

## 2018-05-14 DIAGNOSIS — Z882 Allergy status to sulfonamides status: Secondary | ICD-10-CM | POA: Insufficient documentation

## 2018-05-14 DIAGNOSIS — Z87891 Personal history of nicotine dependence: Secondary | ICD-10-CM | POA: Diagnosis not present

## 2018-05-14 DIAGNOSIS — K644 Residual hemorrhoidal skin tags: Secondary | ICD-10-CM

## 2018-05-14 HISTORY — PX: HEMORRHOID SURGERY: SHX153

## 2018-05-14 HISTORY — PX: PEXY: SHX6024

## 2018-05-14 HISTORY — PX: RECTAL EXAM UNDER ANESTHESIA: SHX6399

## 2018-05-14 SURGERY — HEMORRHOIDECTOMY
Anesthesia: General | Site: Rectum

## 2018-05-14 MED ORDER — OXYCODONE HCL 5 MG/5ML PO SOLN
5.0000 mg | Freq: Once | ORAL | Status: DC | PRN
  Filled 2018-05-14: qty 5

## 2018-05-14 MED ORDER — LACTATED RINGERS IV SOLN
INTRAVENOUS | Status: DC
  Administered 2018-05-14: 07:00:00 via INTRAVENOUS

## 2018-05-14 MED ORDER — SUGAMMADEX SODIUM 200 MG/2ML IV SOLN
INTRAVENOUS | Status: DC | PRN
  Administered 2018-05-14: 150 mg via INTRAVENOUS

## 2018-05-14 MED ORDER — FENTANYL CITRATE (PF) 100 MCG/2ML IJ SOLN
INTRAMUSCULAR | Status: AC
  Administered 2018-05-14: 50 ug via INTRAVENOUS
  Filled 2018-05-14: qty 2

## 2018-05-14 MED ORDER — MIDAZOLAM HCL 5 MG/5ML IJ SOLN
INTRAMUSCULAR | Status: DC | PRN
  Administered 2018-05-14: 2 mg via INTRAVENOUS

## 2018-05-14 MED ORDER — FENTANYL CITRATE (PF) 100 MCG/2ML IJ SOLN
25.0000 ug | INTRAMUSCULAR | Status: DC | PRN
  Administered 2018-05-14: 50 ug via INTRAVENOUS

## 2018-05-14 MED ORDER — ONDANSETRON HCL 4 MG/2ML IJ SOLN
INTRAMUSCULAR | Status: AC
  Administered 2018-05-14: 4 mg via INTRAVENOUS
  Filled 2018-05-14: qty 2

## 2018-05-14 MED ORDER — PROPOFOL 10 MG/ML IV BOLUS
INTRAVENOUS | Status: AC
  Filled 2018-05-14: qty 20

## 2018-05-14 MED ORDER — ONDANSETRON HCL 4 MG/2ML IJ SOLN
INTRAMUSCULAR | Status: AC
  Filled 2018-05-14: qty 2

## 2018-05-14 MED ORDER — FENTANYL CITRATE (PF) 250 MCG/5ML IJ SOLN
INTRAMUSCULAR | Status: AC
  Filled 2018-05-14: qty 5

## 2018-05-14 MED ORDER — CEFAZOLIN SODIUM-DEXTROSE 2-4 GM/100ML-% IV SOLN
2.0000 g | INTRAVENOUS | Status: AC
  Administered 2018-05-14: 2 g via INTRAVENOUS
  Filled 2018-05-14: qty 100

## 2018-05-14 MED ORDER — OXYCODONE HCL 5 MG PO TABS: 5.0000 mg | ORAL_TABLET | Freq: Once | ORAL | Status: DC | PRN

## 2018-05-14 MED ORDER — MEPERIDINE HCL 50 MG/ML IJ SOLN: 6.2500 mg | INTRAMUSCULAR | Status: DC | PRN

## 2018-05-14 MED ORDER — SUGAMMADEX SODIUM 200 MG/2ML IV SOLN
INTRAVENOUS | Status: AC
  Filled 2018-05-14: qty 2

## 2018-05-14 MED ORDER — METRONIDAZOLE IN NACL 5-0.79 MG/ML-% IV SOLN
500.0000 mg | INTRAVENOUS | Status: AC
  Administered 2018-05-14: 500 mg via INTRAVENOUS
  Filled 2018-05-14: qty 100

## 2018-05-14 MED ORDER — DIBUCAINE 1 % RE OINT
TOPICAL_OINTMENT | RECTAL | Status: AC
  Filled 2018-05-14: qty 28

## 2018-05-14 MED ORDER — ONDANSETRON HCL 4 MG/2ML IJ SOLN
INTRAMUSCULAR | Status: DC | PRN
  Administered 2018-05-14: 4 mg via INTRAVENOUS

## 2018-05-14 MED ORDER — 0.9 % SODIUM CHLORIDE (POUR BTL) OPTIME
TOPICAL | Status: DC | PRN
  Administered 2018-05-14: 1000 mL

## 2018-05-14 MED ORDER — LIDOCAINE HCL (CARDIAC) PF 100 MG/5ML IV SOSY
PREFILLED_SYRINGE | INTRAVENOUS | Status: DC | PRN
  Administered 2018-05-14: 50 mg via INTRAVENOUS

## 2018-05-14 MED ORDER — BUPIVACAINE-EPINEPHRINE (PF) 0.25% -1:200000 IJ SOLN
INTRAMUSCULAR | Status: DC | PRN
  Administered 2018-05-14: 30 mL

## 2018-05-14 MED ORDER — CHLORHEXIDINE GLUCONATE CLOTH 2 % EX PADS: 6.0000 | MEDICATED_PAD | Freq: Once | CUTANEOUS | Status: DC

## 2018-05-14 MED ORDER — ACETAMINOPHEN 160 MG/5ML PO SOLN: 325.0000 mg | ORAL | Status: DC | PRN

## 2018-05-14 MED ORDER — KETOROLAC TROMETHAMINE 30 MG/ML IJ SOLN
INTRAMUSCULAR | Status: AC
  Filled 2018-05-14: qty 1

## 2018-05-14 MED ORDER — NAPROXEN 500 MG PO TABS: 500.0000 mg | ORAL_TABLET | Freq: Two times a day (BID) | ORAL | 1 refills | Status: DC

## 2018-05-14 MED ORDER — ONDANSETRON HCL 4 MG/2ML IJ SOLN
4.0000 mg | Freq: Once | INTRAMUSCULAR | Status: AC | PRN
  Administered 2018-05-14: 4 mg via INTRAVENOUS

## 2018-05-14 MED ORDER — BUPIVACAINE-EPINEPHRINE (PF) 0.25% -1:200000 IJ SOLN
INTRAMUSCULAR | Status: AC
  Filled 2018-05-14: qty 30

## 2018-05-14 MED ORDER — ACETAMINOPHEN 325 MG PO TABS: 325.0000 mg | ORAL_TABLET | ORAL | Status: DC | PRN

## 2018-05-14 MED ORDER — AMBULATORY NON FORMULARY MEDICATION: 1.0000 "application " | Freq: Four times a day (QID) | 2 refills | Status: DC

## 2018-05-14 MED ORDER — OXYCODONE HCL 5 MG PO TABS: 5.0000 mg | ORAL_TABLET | Freq: Four times a day (QID) | ORAL | 0 refills | Status: DC | PRN

## 2018-05-14 MED ORDER — PROPOFOL 10 MG/ML IV BOLUS
INTRAVENOUS | Status: DC | PRN
  Administered 2018-05-14: 20 mg via INTRAVENOUS
  Administered 2018-05-14: 100 mg via INTRAVENOUS

## 2018-05-14 MED ORDER — KETOROLAC TROMETHAMINE 30 MG/ML IJ SOLN
30.0000 mg | Freq: Once | INTRAMUSCULAR | Status: AC | PRN
  Administered 2018-05-14: 30 mg via INTRAVENOUS

## 2018-05-14 MED ORDER — ROCURONIUM BROMIDE 100 MG/10ML IV SOLN
INTRAVENOUS | Status: DC | PRN
  Administered 2018-05-14: 5 mg via INTRAVENOUS
  Administered 2018-05-14: 40 mg via INTRAVENOUS

## 2018-05-14 MED ORDER — MIDAZOLAM HCL 2 MG/2ML IJ SOLN
INTRAMUSCULAR | Status: AC
  Filled 2018-05-14: qty 2

## 2018-05-14 MED ORDER — GABAPENTIN 300 MG PO CAPS
300.0000 mg | ORAL_CAPSULE | ORAL | Status: AC
  Administered 2018-05-14: 300 mg via ORAL
  Filled 2018-05-14: qty 1

## 2018-05-14 MED ORDER — DEXAMETHASONE SODIUM PHOSPHATE 10 MG/ML IJ SOLN
INTRAMUSCULAR | Status: DC | PRN
  Administered 2018-05-14: 10 mg via INTRAVENOUS

## 2018-05-14 MED ORDER — CELECOXIB 200 MG PO CAPS
200.0000 mg | ORAL_CAPSULE | ORAL | Status: AC
  Administered 2018-05-14: 200 mg via ORAL
  Filled 2018-05-14: qty 1

## 2018-05-14 MED ORDER — BUPIVACAINE LIPOSOME 1.3 % IJ SUSP
INTRAMUSCULAR | Status: DC | PRN
  Administered 2018-05-14: 20 mL

## 2018-05-14 MED ORDER — ACETAMINOPHEN 500 MG PO TABS
1000.0000 mg | ORAL_TABLET | ORAL | Status: AC
  Administered 2018-05-14: 1000 mg via ORAL
  Filled 2018-05-14: qty 2

## 2018-05-14 MED ORDER — FENTANYL CITRATE (PF) 100 MCG/2ML IJ SOLN
INTRAMUSCULAR | Status: DC | PRN
  Administered 2018-05-14: 100 ug via INTRAVENOUS
  Administered 2018-05-14: 50 ug via INTRAVENOUS

## 2018-05-14 SURGICAL SUPPLY — 33 items
BENZOIN TINCTURE PRP APPL 2/3 (GAUZE/BANDAGES/DRESSINGS) ×2 IMPLANT
BLADE SURG 15 STRL LF DISP TIS (BLADE) ×1 IMPLANT
BLADE SURG 15 STRL SS (BLADE) ×1
BRIEF STRETCH FOR OB PAD LRG (UNDERPADS AND DIAPERS) ×2 IMPLANT
CONT SPEC 4OZ CLIKSEAL STRL BL (MISCELLANEOUS) ×2 IMPLANT
COVER SURGICAL LIGHT HANDLE (MISCELLANEOUS) ×2 IMPLANT
DECANTER SPIKE VIAL GLASS SM (MISCELLANEOUS) ×2 IMPLANT
DRAPE LAPAROTOMY T 102X78X121 (DRAPES) ×2 IMPLANT
DRSG PAD ABDOMINAL 8X10 ST (GAUZE/BANDAGES/DRESSINGS) IMPLANT
ELECT PENCIL ROCKER SW 15FT (MISCELLANEOUS) ×2 IMPLANT
ELECT REM PT RETURN 15FT ADLT (MISCELLANEOUS) ×2 IMPLANT
GAUZE 4X4 16PLY RFD (DISPOSABLE) ×2 IMPLANT
GAUZE SPONGE 4X4 12PLY STRL (GAUZE/BANDAGES/DRESSINGS) ×2 IMPLANT
GLOVE ECLIPSE 8.0 STRL XLNG CF (GLOVE) ×2 IMPLANT
GLOVE INDICATOR 8.0 STRL GRN (GLOVE) ×2 IMPLANT
GOWN STRL REUS W/TWL XL LVL3 (GOWN DISPOSABLE) ×4 IMPLANT
KIT BASIN OR (CUSTOM PROCEDURE TRAY) ×2 IMPLANT
LOOP VESSEL MAXI BLUE (MISCELLANEOUS) IMPLANT
LUBRICANT JELLY K Y 4OZ (MISCELLANEOUS) ×2 IMPLANT
NEEDLE HYPO 22GX1.5 SAFETY (NEEDLE) ×2 IMPLANT
PACK BASIC VI WITH GOWN DISP (CUSTOM PROCEDURE TRAY) ×2 IMPLANT
PAD ABD 8X10 STRL (GAUZE/BANDAGES/DRESSINGS) ×2 IMPLANT
SHEARS HARMONIC 9CM CVD (BLADE) IMPLANT
SUT CHROMIC 2 0 SH (SUTURE) ×4 IMPLANT
SUT CHROMIC 3 0 SH 27 (SUTURE) IMPLANT
SUT VIC AB 2-0 SH 27 (SUTURE)
SUT VIC AB 2-0 SH 27X BRD (SUTURE) IMPLANT
SUT VIC AB 2-0 UR6 27 (SUTURE) ×12 IMPLANT
SYR 20CC LL (SYRINGE) ×2 IMPLANT
SYR 3ML LL SCALE MARK (SYRINGE) IMPLANT
TOWEL OR 17X26 10 PK STRL BLUE (TOWEL DISPOSABLE) ×2 IMPLANT
TOWEL OR NON WOVEN STRL DISP B (DISPOSABLE) ×2 IMPLANT
YANKAUER SUCT BULB TIP 10FT TU (MISCELLANEOUS) ×2 IMPLANT

## 2018-05-14 NOTE — Discharge Instructions (Signed)
ANORECTAL SURGERY:  °POST OPERATIVE INSTRUCTIONS ° °###################################################################### ° °EAT °Start with a pureed / full liquid diet °After 24 hours, gradually transition to a high fiber diet.   ° °CONTROL PAIN °Control pain so you can tolerate bowel movements,  °walk, sleep, tolerate sneezing/coughing, and go up/down stairs. ° ° °HAVE A BOWEL MOVEMENT DAILY °Keep your bowels regular to avoid problems.   °Taking a fiber supplement every day to keep bowels soft.   °Try a laxative to override constipation. °Use an antidairrheal to slow down diarrhea.   °Call if not better after 2 tries ° °WALK °Walk an hour a day.  Control your pain to do that. °  °CALL IF YOU HAVE PROBLEMS/CONCERNS °Call if you are still struggling despite following these instructions. °Call if you have concerns not answered by these instructions ° °###################################################################### ° ° ° °1. Take your usually prescribed home medications unless otherwise directed. °2. DIET: Follow a light bland diet the first 24 hours after arrival home, such as soup, liquids, crackers, etc.  Be sure to include lots of fluids daily.  Avoid fast food or heavy meals as your are more likely to get nauseated.  Eat a low fat the next few days after surgery.   °3. PAIN CONTROL: °a. Pain is best controlled by a usual combination of three different methods TOGETHER: °i. Ice/Heat °ii. Over the counter pain medication °iii. Prescription pain medication °b. Expect swelling and discomfort in the anus/rectal area.  Warm water baths (30-60 minutes up to 6 times a day, especially after bowel meovements) will help. Use ice for the first few days to help decrease swelling and bruising, then switch to heat such as warm towels, sitz baths, warm baths, etc to help relax tight/sore spots and speed recovery.  Some people prefer to use ice alone, heat alone, alternating between ice & heat.  Experiment to what works  for you.   °c. It is helpful to take an over-the-counter pain medication continuously for the first few weeks.  Choose one of the following that works best for you: °i. Naproxen (Aleve, etc)  Two 220mg tabs twice a day °ii. Ibuprofen (Advil, etc) Three 200mg tabs four times a day (every meal & bedtime) °iii. Acetaminophen (Tylenol, etc) 500-650mg four times a day (every meal & bedtime) °d. A  prescription for pain medication (such as oxycodone, hydrocodone, etc) should be given to you upon discharge.  Take your pain medication as prescribed.  °i. If you are having problems/concerns with the prescription medicine (does not control pain, nausea, vomiting, rash, itching, etc), please call us [phone] to see if we need to switch you to a different pain medicine that will work better for you and/or control your side effect better. °ii. If you need a refill on your pain medication, please contact your pharmacy.  They will contact our office to request authorization. Prescriptions will not be filled after 5 pm or on week-ends.  If can take up to 48 hours for it to be filled & ready so avoid waiting until you are down to thel ast pill. °e. A topical cream (Dibucaine) or a prescription for a cream (such as diltiazem 2% gel) may be given to you.  Many people find relief with topical creams.  Some people find it burns too much.  Experiment.  If it helps, use it.  If it burns, don't using it. ° °Use a Sitz Bath 4-8 times a day for relief ° ° °Sitz Bath °A sitz bath   is a warm water bath taken in the sitting position that covers only the hips and buttocks. It may be used for either healing or hygiene purposes. Sitz baths are also used to relieve pain, itching, or muscle spasms. The water may contain medicine. Moist heat will help you heal and relax.  °HOME CARE INSTRUCTIONS  °Take 3 to 4 sitz baths a day. °1. Fill the bathtub half full with warm water. °2. Sit in the water and open the drain a little. °3. Turn on the warm  water to keep the tub half full. Keep the water running constantly. °4. Soak in the water for 15 to 20 minutes. °5. After the sitz bath, pat the affected area dry first. ° ° °4. KEEP YOUR BOWELS REGULAR °a. The goal is one soft bowel movement a day °b. Avoid getting constipated.  Between the surgery and the pain medications, it is common to experience some constipation.  Increasing fluid intake and taking a fiber supplement (such as Metamucil, Citrucel, FiberCon, MiraLax, etc) 2-3 times a day regularly will usually help prevent this problem from occurring.  A mild laxative (prune juice, Milk of Magnesia, MiraLax, etc) should be taken according to package directions if there are no bowel movements after 48 hours. °c. Watch out for diarrhea.  If you have many loose bowel movements, simplify your diet to bland foods & liquids for a few days.  Stop any stool softeners and decrease your fiber supplement.  Switching to mild anti-diarrheal medications (Kayopectate, Pepto Bismol) can help.  Can try an imodium/loperamide dose.  If this worsens or does not improve, please call us. ° °5. Wound Care ° °a. Remove your bandages with your first bowel movement, usually the day after surgery.  You may have packing if you had an abscess.  Let any packing or gauze fall come out.   °b. Wear an absorbent pad or soft cotton balls in your underwear as needed to catch any drainage and help keep the area  °c. Keep the area clean and dry.  Bathe / shower every day.  Keep the area clean by showering / bathing over the incision / wound.   It is okay to soak an open wound to help wash it.  Consider using a squeeze bottle filled with warm water to gently wash the anal area.  Wet wipes or showers / gentle washing after bowel movements is often less traumatic than regular toilet paper. °d. You will often notice bleeding with bowel movements.  This should slow down by the end of the first week of surgery.  Sitting on an ice pack can  help. °e. Expect some drainage.  This should slow down by the end of the first week of surgery, but you will have occasional bleeding or drainage up to a few months after surgery.  Wear an absorbent pad or soft cotton gauze in your underwear until the drainage stops. ° °6. ACTIVITIES as tolerated:   °a. You may resume regular (light) daily activities beginning the next day--such as daily self-care, walking, climbing stairs--gradually increasing activities as tolerated.  If you can walk 30 minutes without difficulty, it is safe to try more intense activity such as jogging, treadmill, bicycling, low-impact aerobics, swimming, etc. °b. Save the most intensive and strenuous activity for last such as sit-ups, heavy lifting, contact sports, etc  Refrain from any heavy lifting or straining until you are off narcotics for pain control.   °c. DO NOT PUSH THROUGH PAIN.  Let pain   be your guide: If it hurts to do something, don't do it.  Pain is your body warning you to avoid that activity for another week until the pain goes down. °d. You may drive when you are no longer taking prescription pain medication, you can comfortably sit for long periods of time, and you can safely maneuver your car and apply brakes. °e. You may have sexual intercourse when it is comfortable.  °7. FOLLOW UP in our office °a. Please call [facility] at [phone] to set up an appointment to see your surgeon in the office for a follow-up appointment approximately 2-3 weeks after your surgery. °b. Make sure that you call for this appointment the day you arrive home to ensure a convenient appointment time. ° °8. IF YOU HAVE DISABILITY OR FAMILY LEAVE FORMS, BRING THEM TO THE OFFICE FOR PROCESSING.  DO NOT GIVE THEM TO YOUR DOCTOR. ° ° ° ° ° ° ° °WHEN TO CALL US [phone]: °1. Poor pain control °2. Reactions / problems with new medications (rash/itching, nausea, etc)  °3. Fever over 101.5 F (38.5 C) °4. Inability to urinate °5. Nausea and/or  vomiting °6. Worsening swelling or bruising °7. Continued bleeding from incision. °8. Increased pain, redness, or drainage from the incision ° °The clinic staff is available to answer your questions during regular business hours (8:30am-5pm).  Please don’t hesitate to call and ask to speak to one of our nurses for clinical concerns.   A surgeon from [facility] is always on call at the hospitals °  °If you have a medical emergency, go to the nearest emergency room or call 911. °  ° °[facility] °[address], [address], [place], [place]  [address] ? °MAIN: [phone] ? TOLL FREE: [phone] ? °FAX [phone] °[email or web address] ° ° °

## 2018-05-14 NOTE — Transfer of Care (Signed)
Immediate Anesthesia Transfer of Care Note  Patient: [name]  Procedure(s) Performed: HEMORRHOIDECTOMY (N/A Rectum) HEMORRHOIDAL LIGATION/PEXY (N/A Rectum) ANORECTAL EXAM UNDER ANESTHESIA (N/A Rectum)  Patient Location: PACU  Anesthesia Type:General  Level of Consciousness: awake, alert  and oriented  Airway & Oxygen Therapy: Patient Spontanous Breathing and Patient connected to face mask oxygen  Post-op Assessment: Report given to RN and Post -op Vital signs reviewed and stable  Post vital signs: Reviewed and stable  Last Vitals:  Vitals Value Taken Time  BP    Temp    Pulse 86 05/14/2018 10:12 AM  Resp 19 05/14/2018 10:12 AM  SpO2 83 % 05/14/2018 10:12 AM  Vitals shown include unvalidated device data.  Last Pain:  Vitals:   05/14/18 0706  TempSrc:   PainSc: 0-No pain         Complications: No apparent anesthesia complications

## 2018-05-14 NOTE — Anesthesia Preprocedure Evaluation (Addendum)
Anesthesia Evaluation  Patient identified by MRN, date of birth, ID band Patient awake    Reviewed: Allergy & Precautions, NPO status , Patient's Chart, lab work & pertinent test results  Airway Mallampati: I       Dental no notable dental hx. (+) Teeth Intact   Pulmonary former smoker,    Pulmonary exam normal breath sounds clear to auscultation       Cardiovascular negative cardio ROS Normal cardiovascular exam Rhythm:Regular Rate:Normal     Neuro/Psych negative neurological ROS  negative psych ROS   GI/Hepatic negative GI ROS, Neg liver ROS,   Endo/Other  negative endocrine ROS  Renal/GU negative Renal ROS  negative genitourinary   Musculoskeletal negative musculoskeletal ROS (+)   Abdominal Normal abdominal exam  (+)   Peds  Hematology negative hematology ROS (+)   Anesthesia Other Findings   Reproductive/Obstetrics negative OB ROS                            Anesthesia Physical Anesthesia Plan  ASA: I  Anesthesia Plan: General   Post-op Pain Management:    Induction: Intravenous  PONV Risk Score and Plan: 4 or greater and Ondansetron and Dexamethasone  Airway Management Planned: Oral ETT  Additional Equipment:   Intra-op Plan:   Post-operative Plan: Extubation in OR  Informed Consent: I have reviewed the patients History and Physical, chart, labs and discussed the procedure including the risks, benefits and alternatives for the proposed anesthesia with the patient or authorized representative who has indicated his/her understanding and acceptance.   Dental advisory given  Plan Discussed with: CRNA and Surgeon  Anesthesia Plan Comments:        Anesthesia Quick Evaluation

## 2018-05-14 NOTE — Op Note (Signed)
05/14/2018  9:55 AM  PATIENT:  Chloe Pittman  32 y.o. female  Patient Care Team: Via, Lennette Bihari, MD as PCP - General (Family Medicine) Michael Boston, MD as Consulting Physician (General Surgery) Bobbye Charleston, MD as Consulting Physician (Obstetrics and Gynecology)  PRE-OPERATIVE DIAGNOSIS:  Hemorrhoids grade 2 & 3 with pain. External hemorrhoid  POST-OPERATIVE DIAGNOSIS:  Hemorrhoids grade 2 & 3 with pain. External hemorrhoid  PROCEDURE:  Procedure(s): HEMORRHOIDECTOMY HEMORRHOIDAL LIGATION/PEXY ANORECTAL EXAM UNDER ANESTHESIA   External hemorrhoidectomy  x2 Internal hemorrhoidal ligation and pexy Anorectal examination under anesthesia  SURGEON:  [name], MD  ANESTHESIA:   General Anorectal & Local field block  0.25% bupivacaine with epinephrine at the beginning of the case. Liposomal bupivacaine (Experel) at the end of the case.  EBL:  No intake/output data recorded..  See operative record  Delay start of Pharmacological VTE agent (>24hrs) due to surgical blood loss or risk of bleeding:  NO  DRAINS: NONE  SPECIMEN:   External hemorrhoidx2  DISPOSITION OF SPECIMEN:  PATHOLOGY  COUNTS:  YES  PLAN OF CARE: Discharge home after PACU  PATIENT DISPOSITION:  PACU - hemodynamically stable.  INDICATION: Pleasant patient with struggles with hemorrhoids.  Not able to be managed in the office despite an improved bowel regimen.  I recommended examination under anesthesia and surgical treatment:  The anatomy & physiology of the anorectal region was discussed.  The pathophysiology of hemorrhoids and differential diagnosis was discussed.  Natural history risks without surgery was discussed.   I stressed the importance of a bowel regimen to have daily soft bowel movements to minimize progression of disease.  Interventions such as sclerotherapy & banding were discussed.  The patient's symptoms are not adequately controlled by medicines and other non-operative treatments.   I feel the risks & problems of no surgery outweigh the operative risks; therefore, I recommended surgery to treat the hemorrhoids by ligation, pexy, and possible resection.  Risks such as bleeding, infection, need for further treatment, heart attack, death, and other risks were discussed.   I noted a good likelihood this will help address the problem.  Goals of post-operative recovery were discussed as well.  Possibility that this will not correct all symptoms was explained.  Post-operative pain, bleeding, constipation, urinary difficulties, and other problems after surgery were discussed.  We will work to minimize complications.   Educational handouts further explaining the pathology, treatment options, and bowel regimen were given as well.  Questions were answered.  The patient expresses understanding & wishes to proceed with surgery.  OR FINDINGS: Grade 3 internal hemorrhoids left-sided and right posterior.  Right anterior more grade 2.  Anterior left irritated anal tag.  Most symptomatic region.  DESCRIPTION:   Informed consent was confirmed. Patient underwent general anesthesia without difficulty. Patient was placed into prone positioning.  The perianal region was prepped and draped in sterile fashion. Surgical time-out confirmed our plan.  I did digital rectal examination and then transitioned over to anoscopy to get a sense of the anatomy.  Findings noted above.   I proceeded to do hemorrhoidal ligation and pexy.  I used a 2-0 Vicryl suture on a UR-6 needle in a figure-of-eight fashion 6 cm proximal to the anal verge.  I started at the largest hemorrhoid pile anteriorly.  Because of redundant hemorrhoidal tissue too bulky to merely ligate or pexy, I excised the excess internal hemorrhoid piles longitudinally in a fusiform biconcave fashion, at the left anterolateral and left posterior lateral piles especially some hypertrophic  anal crypts, sparing the anal canal to avoid narrowing.  I then ran  that stitch longitudinally more distally to close the hemorrhoidectomy wound to the anal verge over a Parks self retaining retractor & occasionally a large Hill-Furgeson retractor to avoid narrowing of the anal canal.  I then tied that stitch down to cause a hemorrhoidopexy.   I then did hemorrhoidal ligation and pexy at the other 4 columns.  At the completion of this, all 6 anorectal columns were ligated and pexied in the classic hexagonal fashion (right anterior/lateral/posterior, left anterior/lateral/posterior).  I closed the external part of the hemorrhoidectomy wounds with interrupted horizontal mattress 2-0 chromic suture, leaving the last 5 mm open to allow natural drainage.    I redid anoscopy & examination.  At completion of this, all hemorrhoids had been removed or reduced into the rectum.  There is no more prolapse.  Internal & external anatomy was more more normal.  Hemostasis was good.  Fluffed gauze was on-laid over the perianal region.  No packing done.  Patient is being extubated go to go to the recovery room.  I had discussed postop care in detail with the patient in the preop holding area.  Instructions for post-operative recovery and prescriptions are written. I discussed operative findings, updated the patient's status, discussed probable steps to recovery, and gave postoperative recommendations to the patient's spouse.  Recommendations were made.  Questions were answered.  He expressed understanding & appreciation.  [name], M.D., F.A.C.S. Gastrointestinal and Minimally Invasive Surgery [facility] [address], [place], [place] [address] [phone] Main / Paging

## 2018-05-14 NOTE — H&P (Signed)
Chloe Pittman DOB: [birth year]  Patient Care Team: Chloe Kid, MD as PCP - General (Family Medicine) Chloe Boston, MD as Consulting Physician (General Surgery) Chloe Charleston, MD as Consulting Physician (Obstetrics and Gynecology)   Patient sent for surgical consultation at the request of Dr. Philis Pittman  Chief Complaint: Hemorrhoids  The patient is a pleasant healthy female. She notes that she had worsening hemorrhoid initials with the third trimester of pregnancy 2017-2018. Had some anal swelling. It calmed down after delivering the child but she keeps getting issues of irritation. Hard to keep the area clean. Frustrated. She was referred to Dr. Earlean Pittman with gastroenterology. He did not find any internal problems to band. She had much of an external hemorrhoid. Try to get reassurance. I'll noted flares with discomfort and irritation. She's had a couple episodes of pain after bowel movements. She was concerned. Surgical consultation made for second opinion. I saw her last year in offered hemorrhoidal ligation and pexy with external hemorrhoidectomy. She was leaning toward surgery but ultimately canceled.  Has been a year. She still struggling with intermittent irritation and discomfort. On a daily basis down. She claims she moves her bowels every day. Main issues of feels very itchy a raw. Has to wipe constantly. Frustrating and a nuisance. No severe pain. No major bleeding. Patient claims she moves her bowels about once or twice a day. She has yet to try a fiber supplement. She's never had any anorectal interventions. She does not smoke. She is not on blood thinners. No cardiac issues.  No personal nor family history of GI/colon cancer, inflammatory bowel disease, irritable bowel syndrome, allergy such as Celiac Sprue, dietary/dairy problems, colitis, ulcers nor gastritis. No recent sick contacts/gastroenteritis. No travel outside the country. No changes in  diet. No dysphagia to solids or liquids. No significant heartburn or reflux. No hematochezia, hematemesis, coffee ground emesis. No evidence of prior gastric/peptic ulceration.  (Review of systems as stated in this history (HPI) or in the review of systems. Otherwise all other 12 point ROS are negative)   Allergies (Chloe Pittman, [place]; 03/18/2018 11:52 AM) Sulfa Antibiotics Nausea. Allergies Reconciled  Medication History (Chloe Pittman, [place]; 03/18/2018 11:52 AM) Sertraline HCl (50MG  Tablet, Oral) Active. Proctosol HC (2.5% Cream, 1 (one) Rectal four times daily, as needed, Taken starting 05/27/2017) Active. Medications Reconciled     Review of Systems Chloe Hector, MD; 03/18/2018 12:19 PM) General Not Present- Appetite Loss, Chills, Fatigue, Fever, Night Sweats, Weight Gain and Weight Loss. Skin Not Present- Change in Wart/Mole, Dryness, Hives, Jaundice, New Lesions, Non-Healing Wounds, Rash and Ulcer. HEENT Not Present- Earache, Hearing Loss, Hoarseness, Nose Bleed, Oral Ulcers, Ringing in the Ears, Seasonal Allergies, Sinus Pain, Sore Throat, Visual Disturbances, Wears glasses/contact lenses and Yellow Eyes. Respiratory Not Present- Bloody sputum, Chronic Cough, Difficulty Breathing, Snoring and Wheezing. Breast Not Present- Breast Mass, Breast Pain, Nipple Discharge and Skin Changes. Cardiovascular Not Present- Chest Pain, Difficulty Breathing Lying Down, Leg Cramps, Palpitations, Rapid Heart Rate, Shortness of Breath and Swelling of Extremities. Gastrointestinal Present- Hemorrhoids. Not Present- Abdominal Pain, Bloating, Bloody Stool, Change in Bowel Habits, Chronic diarrhea, Constipation, Difficulty Swallowing, Excessive gas, Gets full quickly at meals, Indigestion, Nausea, Rectal Pain and Vomiting. Female Genitourinary Not Present- Frequency, Nocturia, Painful Urination, Pelvic Pain and Urgency. Musculoskeletal Present- Back Pain. Not Present- Joint Pain,  Joint Stiffness, Muscle Pain, Muscle Weakness and Swelling of Extremities. Neurological Not Present- Decreased Memory, Fainting, Headaches, Numbness, Seizures, Tingling, Tremor, Trouble walking and Weakness. Endocrine Present- Hair  Changes. Not Present- Cold Intolerance, Excessive Hunger, Heat Intolerance, Hot flashes and New Diabetes. Hematology Not Present- Blood Thinners, Easy Bruising, Excessive bleeding, Gland problems, HIV and Persistent Infections.  Vitals (Chloe A. Brown RMA; 03/18/2018 11:52 AM) 03/18/2018 11:51 AM Weight: 159.8 lb Height: 70in Body Surface Area: 1.9 m Body Mass Index: 22.93 kg/m  Temp.: 98.14F  Pulse: 80 (Regular)  BP: 116/68 (Sitting, Left Arm, Standard)    BP (!) 100/56   Pulse (!) 57   Temp 98.4 F (36.9 C) (Oral)   Resp 16   Ht 5\' 10"  (1.778 m)   Wt 71.4 kg (157 lb 6 oz)   LMP 05/05/2018 (Approximate) Comment: 05/09/18 negative urine pregnancy  SpO2 100%   BMI 22.58 kg/m    Physical Exam Chloe Hector MD; 03/18/2018 12:26 PM)  General Mental Status-Alert. General Appearance-Not in acute distress, Not Sickly. Orientation-Oriented X3. Hydration-Well hydrated. Voice-Normal.  Integumentary Global Assessment Normal Exam - Axillae: non-tender, no inflammation or ulceration, no drainage. and Distribution of scalp and body hair is normal. General Characteristics Temperature - normal warmth is noted.  Head and Neck Head-normocephalic, atraumatic with no lesions or palpable masses. Face Global Assessment - atraumatic, no absence of expression. Neck Global Assessment - no abnormal movements, no bruit auscultated on the right, no bruit auscultated on the left, no decreased range of motion, non-tender. Trachea-midline. Thyroid Gland Characteristics - non-tender.  Eye Eyeball - Left-Extraocular movements intact, No Nystagmus. Eyeball - Right-Extraocular movements intact, No Nystagmus. Cornea - Left-No  Hazy. Cornea - Right-No Hazy. Sclera/Conjunctiva - Left-No scleral icterus, No Discharge. Sclera/Conjunctiva - Right-No scleral icterus, No Discharge. Pupil - Left-Direct reaction to light normal. Pupil - Right-Direct reaction to light normal.  ENMT Ears Pinna - Left - no drainage observed, no generalized tenderness observed. Right - no drainage observed, no generalized tenderness observed. Nose and Sinuses Nose - no destructive lesion observed. Nares - Left - quiet respiration. Right - quiet respiration. Mouth and Throat Lips - Upper Lip - no fissures observed, no pallor noted. Lower Lip - no fissures observed, no pallor noted. Nasopharynx - no discharge present. Oral Cavity/Oropharynx - Tongue - no dryness observed. Oral Mucosa - no cyanosis observed. Hypopharynx - no evidence of airway distress observed.  Chest and Lung Exam Inspection Movements - Normal and Symmetrical. Accessory muscles - No use of accessory muscles in breathing. Palpation Normal exam - Non-tender. Auscultation Breath sounds - Normal and Clear.  Cardiovascular Auscultation Rhythm - Regular. Murmurs & Other Heart Sounds - Normal exam - No Murmurs and No Systolic Clicks.  Abdomen Inspection Normal Exam - No Visible peristalsis and No Abnormal pulsations. Umbilicus - No Bleeding, No Urine drainage. Palpation/Percussion Normal exam - Soft, Non Tender, No Rebound tenderness, No Rigidity (guarding) and No Cutaneous hyperesthesia. Note: Abdomen soft. Not severely distended. No distasis recti. No umbilical or other anterior abdominal wall hernias  Female Genitourinary Sexual Maturity Tanner 5 - Adult hair pattern. Note: No vaginal bleeding nor discharge  Rectal Note: ` ` ` Left anterior midline perianal external hemorrhoid/skin fold.   Perianal skin clean with good hygiene. No pruritis ani. No pilonidal disease. No fissure. No abscess/fistula. Normal sphincter tone.   No  condyloma warts. Tolerates digital and anoscopic rectal exam.   Grade 2 hemorrhoids, esp R posterior & L lateral. Mildly enlarged anal columns but no major inflammation. No active bleeding. No rectal masses.  Exam done with assistance of female CMA in the room.  Peripheral Vascular Upper Extremity Inspection - Left -  No Cyanotic nailbeds, Not Ischemic. Right - No Cyanotic nailbeds, Not Ischemic.  Neurologic Neurologic evaluation reveals -normal attention span and ability to concentrate, able to name objects and repeat phrases. Appropriate fund of knowledge , normal sensation and normal coordination. Mental Status Affect - not angry, not paranoid. Cranial Nerves-Normal Bilaterally. Gait-Normal.  Neuropsychiatric Mental status exam performed with findings of-able to articulate well with normal speech/language, rate, volume and coherence, thought content normal with ability to perform basic computations and apply abstract reasoning and no evidence of hallucinations, delusions, obsessions or homicidal/suicidal ideation.  Musculoskeletal Global Assessment Spine, Ribs and Pelvis - no instability, subluxation or laxity. Right Upper Extremity - no instability, subluxation or laxity.  Lymphatic Head & Neck General Head & Neck Lymphatics: Bilateral - Description - No Localized lymphadenopathy. Axillary General Axillary Region: Bilateral - Description - No Localized lymphadenopathy. Femoral & Inguinal Generalized Femoral & Inguinal Lymphatics: Left: Right - Description - No Localized lymphadenopathy. Description - No Localized lymphadenopathy.    EXTERNAL HEMORRHOIDS WITH COMPLICATION (T55.7) Impression: Small but very irritating anterior external hemorrhoid. I strongly suspect she has some occasionally prolapsing internal hemorrhoids as well since external anatomy does not look that horrible yet struggling with irritation and soiling. I again offered examination under  anesthesia with internal hemorrhoidal ligation and pexy and an external hemorrhoidectomy of the obvious anterior hemorrhoid pile. I would give the best shot at getting her anatomy more normal better place. She is scared of having postoperative pain but understands that should be short lived and get much improved after the first week or so.  The anatomy & physiology of the anorectal region was discussed. The pathophysiology of hemorrhoids and differential diagnosis was discussed. Natural history progression was discussed. I stressed the importance of a bowel regimen to have daily soft bowel movements to minimize progression of disease. Goal of one BM / day ideal. Use of wet wipes, warm baths, avoiding straining, etc were emphasized.  Educational handouts further explaining the pathology, treatment options, and bowel regimen were given as well. The patient expressed understanding.  I did caution her that she will be out several weeks with pain issues and that she cannot independently manage her toddler all by herself. She understands that and will work to coordinate help  PROLAPSED INTERNAL HEMORRHOIDS, GRADE 2 (K64.1)  Current Plans ANOSCOPY, DIAGNOSTIC (32202) Pt Education - [facility] Hemorrhoids (Lucynda Rosano): discussed with patient and provided information.  ENCOUNTER FOR PREOPERATIVE EXAMINATION FOR GENERAL SURGICAL PROCEDURE (Z01.818)  Current Plans Pt Education - [facility] Rectal Prep for Anorectal outpatient/office surgery: discussed with patient and provided information. Pt Education - [facility] Rectal Surgery HCI (Zionah Criswell): discussed with patient and provided information.  Chloe Pittman, M.D., F.A.C.S. Gastrointestinal and Minimally Invasive Surgery [facility] [address], [place], [place] [address] [phone] Main / Paging

## 2018-05-14 NOTE — Interval H&P Note (Signed)
History and Physical Interval Note:  05/14/2018 8:32 AM  Chloe Pittman  has presented today for surgery, with the diagnosis of Hemorrhoids grade 2 & 3 with pain. External hemorrhoid  The various methods of treatment have been discussed with the patient and family. After consideration of risks, benefits and other options for treatment, the patient has consented to  Procedure(s): HEMORRHOIDECTOMY (N/A) HEMORRHOIDAL LIGATION/PEXY (N/A) ANORECTAL EXAM UNDER ANESTHESIA (N/A) as a surgical intervention .  I have re-reviewed the the patient's records, history, medications, and allergies.  I have re-examined the patient.  I again discussed intraoperative plans and goals of post-operative recovery.  The patient agrees to proceed.  Chloe Pittman  27-May-1986 [id number]  Patient Care Team: ViaLennette Bihari, MD as PCP - General (Family Medicine) Chloe Boston, MD as Consulting Physician (General Surgery) Chloe Charleston, MD as Consulting Physician (Obstetrics and Gynecology)  Patient Active Problem List   Diagnosis Date Noted  . Normal labor and delivery 02/14/2016  . Pregnancy 02/13/2016  . Family history of diabetes mellitus type II 11/14/2012    Past Medical History:  Diagnosis Date  . Hx of varicella   . Medical history non-contributory     Past Surgical History:  Procedure Laterality Date  . FACIAL COSMETIC SURGERY     dog bite  . INNER EAR SURGERY    . TUBES IN EARS      Social History   Socioeconomic History  . Marital status: Married    Spouse name: Not on file  . Number of children: Not on file  . Years of education: Not on file  . Highest education level: Not on file  Occupational History  . Not on file  Social Needs  . Financial resource strain: Not on file  . Food insecurity:    Worry: Not on file    Inability: Not on file  . Transportation needs:    Medical: Not on file    Non-medical: Not on file  Tobacco Use  . Smoking status: Former Research scientist (life sciences)  . Smokeless  tobacco: Never Used  . Tobacco comment: Last smoked 5-19, just one cigarette  Substance and Sexual Activity  . Alcohol use: Yes    Comment: SOCIAL - WEEK ENDS   . Drug use: No  . Sexual activity: Yes    Birth control/protection: None  Lifestyle  . Physical activity:    Days per week: Not on file    Minutes per session: Not on file  . Stress: Not on file  Relationships  . Social connections:    Talks on phone: Not on file    Gets together: Not on file    Attends religious service: Not on file    Active member of club or organization: Not on file    Attends meetings of clubs or organizations: Not on file    Relationship status: Not on file  . Intimate partner violence:    Fear of current or ex partner: Not on file    Emotionally abused: Not on file    Physically abused: Not on file    Forced sexual activity: Not on file  Other Topics Concern  . Not on file  Social History Narrative  . Not on file    Family History  Problem Relation Age of Onset  . Cancer Father        TONSIL   . Hypertension Father   . Diabetes Father   . Cancer Paternal Aunt        SKIN  .  Heart disease Maternal Grandmother   . Cancer Paternal Grandmother        LUNG AND LIVER  . Stroke Paternal Grandmother   . Diabetes Mother     Medications Prior to Admission  Medication Sig Dispense Refill Last Dose  . ibuprofen (ADVIL,MOTRIN) 200 MG tablet Take 200 mg by mouth every 6 (six) hours as needed.   Past Week at Unknown time  . Multiple Vitamins-Minerals (WOMENS MULTIVITAMIN) TABS Take 1 tablet by mouth daily.   05/12/2018  . Pumpkin Seed-Soy Germ (AZO BLADDER CONTROL/GO-LESS PO) Take 1 tablet by mouth daily.   05/12/2018  . sertraline (ZOLOFT) 50 MG tablet Take 25 mg by mouth daily.   4 05/14/2018 at 0600  . docusate sodium (COLACE) 100 MG capsule Take 100 mg by mouth 2 (two) times daily.   05/12/2018    Current Facility-Administered Medications  Medication Dose Route Frequency Provider Last Rate Last  Dose  . bupivacaine liposome (EXPAREL) 1.3 % injection 266 mg  20 mL Infiltration On Call to OR Chloe Boston, MD      . ceFAZolin (ANCEF) IVPB 2g/100 mL premix  2 g Intravenous On Call to OR Chloe Boston, MD       And  . metroNIDAZOLE (FLAGYL) IVPB 500 mg  500 mg Intravenous On Call to OR Chloe Boston, MD      . Chlorhexidine Gluconate Cloth 2 % PADS 6 each  6 each Topical Once Chloe Boston, MD       And  . Chlorhexidine Gluconate Cloth 2 % PADS 6 each  6 each Topical Once Chloe Boston, MD      . lactated ringers infusion   Intravenous Continuous Chloe Ghee, MD 10 mL/hr at 05/14/18 8416       Allergies  Allergen Reactions  . Sulfa Antibiotics Nausea Only    BP (!) 100/56   Pulse (!) 57   Temp 98.4 F (36.9 C) (Oral)   Resp 16   Ht 5\' 10"  (1.778 m)   Wt 71.4 kg (157 lb 6 oz)   LMP 05/05/2018 (Approximate) Comment: 05/09/18 negative urine pregnancy  SpO2 100%   BMI 22.58 kg/m   Labs: No results found for this or any previous visit (from the past 48 hour(s)).  Imaging / Studies: No results found.   Chloe Pittman, M.D., F.A.C.S. Gastrointestinal and Minimally Invasive Surgery [facility] [address], [place], [place] [address] [phone] Main / Paging  05/14/2018 8:32 AM    The patient's history has been reviewed, patient examined, no change in status, stable for surgery.  I have reviewed the patient's chart and labs.  Questions were answered to the patient's satisfaction.     Chloe Pittman

## 2018-05-14 NOTE — Anesthesia Procedure Notes (Signed)
Procedure Name: Intubation Date/Time: 05/14/2018 8:52 AM Performed by: Glory Buff, CRNA Pre-anesthesia Checklist: Patient identified, Emergency Drugs available, Suction available and Patient being monitored Patient Re-evaluated:Patient Re-evaluated prior to induction Oxygen Delivery Method: Circle system utilized Preoxygenation: Pre-oxygenation with 100% oxygen Induction Type: IV induction Ventilation: Mask ventilation without difficulty Laryngoscope Size: Miller and 3 Grade View: Grade I Tube type: Oral Tube size: 7.0 mm Number of attempts: 1 Airway Equipment and Method: Stylet and Oral airway Placement Confirmation: ETT inserted through vocal cords under direct vision,  positive ETCO2 and breath sounds checked- equal and bilateral Secured at: 20 cm Tube secured with: Tape Dental Injury: Teeth and Oropharynx as per pre-operative assessment

## 2018-05-15 ENCOUNTER — Encounter (HOSPITAL_COMMUNITY): Payer: Self-pay | Admitting: Surgery

## 2018-05-16 NOTE — Anesthesia Postprocedure Evaluation (Signed)
Anesthesia Post Note  Patient: Chloe Pittman  Procedure(s) Performed: HEMORRHOIDECTOMY (N/A Rectum) HEMORRHOIDAL LIGATION/PEXY (N/A Rectum) ANORECTAL EXAM UNDER ANESTHESIA (N/A Rectum)     Patient location during evaluation: PACU Anesthesia Type: General Level of consciousness: awake Pain management: pain level controlled Respiratory status: spontaneous breathing Cardiovascular status: stable Postop Assessment: no apparent nausea or vomiting Anesthetic complications: no    Last Vitals:  Vitals:   05/14/18 1108 05/14/18 1142  BP: (!) 94/51 (!) 98/53  Pulse: 66 76  Resp: 14 14  Temp: 36.6 C 36.4 C  SpO2: 99% 100%    Last Pain:  Vitals:   05/16/18 1206  TempSrc:   PainSc: 3    Pain Goal:                 Tyler Robidoux JR,JOHN Afia Messenger

## 2018-07-02 DIAGNOSIS — L858 Other specified epidermal thickening: Secondary | ICD-10-CM | POA: Diagnosis not present

## 2018-07-02 DIAGNOSIS — D2272 Melanocytic nevi of left lower limb, including hip: Secondary | ICD-10-CM | POA: Diagnosis not present

## 2018-07-02 DIAGNOSIS — L821 Other seborrheic keratosis: Secondary | ICD-10-CM | POA: Diagnosis not present

## 2018-07-02 DIAGNOSIS — D225 Melanocytic nevi of trunk: Secondary | ICD-10-CM | POA: Diagnosis not present

## 2018-09-04 DIAGNOSIS — M791 Myalgia, unspecified site: Secondary | ICD-10-CM | POA: Diagnosis not present

## 2018-09-04 DIAGNOSIS — R5382 Chronic fatigue, unspecified: Secondary | ICD-10-CM | POA: Diagnosis not present

## 2018-10-08 DIAGNOSIS — R5382 Chronic fatigue, unspecified: Secondary | ICD-10-CM | POA: Diagnosis not present

## 2018-12-18 ENCOUNTER — Inpatient Hospital Stay (HOSPITAL_COMMUNITY)
Admission: EM | Admit: 2018-12-18 | Discharge: 2018-12-22 | DRG: 060 | Disposition: A | Discharge status: Home Health | Payer: BLUE CROSS/BLUE SHIELD | Attending: Internal Medicine | Admitting: Internal Medicine

## 2018-12-18 ENCOUNTER — Emergency Department (HOSPITAL_COMMUNITY): Payer: BLUE CROSS/BLUE SHIELD

## 2018-12-18 ENCOUNTER — Encounter: Payer: Self-pay | Admitting: Emergency Medicine

## 2018-12-18 DIAGNOSIS — G35A Relapsing-remitting multiple sclerosis: Secondary | ICD-10-CM | POA: Diagnosis present

## 2018-12-18 DIAGNOSIS — G35 Multiple sclerosis: Secondary | ICD-10-CM | POA: Diagnosis not present

## 2018-12-18 DIAGNOSIS — Z87891 Personal history of nicotine dependence: Secondary | ICD-10-CM | POA: Diagnosis not present

## 2018-12-18 DIAGNOSIS — R202 Paresthesia of skin: Secondary | ICD-10-CM | POA: Diagnosis not present

## 2018-12-18 DIAGNOSIS — M5124 Other intervertebral disc displacement, thoracic region: Secondary | ICD-10-CM | POA: Diagnosis not present

## 2018-12-18 DIAGNOSIS — R208 Other disturbances of skin sensation: Secondary | ICD-10-CM | POA: Diagnosis not present

## 2018-12-18 DIAGNOSIS — Z882 Allergy status to sulfonamides status: Secondary | ICD-10-CM

## 2018-12-18 DIAGNOSIS — R200 Anesthesia of skin: Secondary | ICD-10-CM | POA: Diagnosis not present

## 2018-12-18 LAB — BASIC METABOLIC PANEL
Anion gap: 8 (ref 5–15)
BUN: 14 mg/dL (ref 6–20)
CO2: 25 mmol/L (ref 22–32)
Calcium: 9.3 mg/dL (ref 8.9–10.3)
Chloride: 105 mmol/L (ref 98–111)
Creatinine, Ser: 0.89 mg/dL (ref 0.44–1.00)
GFR calc Af Amer: >60 mL/min (ref >60)
GFR calc non Af Amer: >60 mL/min (ref >60)
Glucose, Bld: 85 mg/dL (ref 70–99)
Potassium: 4 mmol/L (ref 3.5–5.1)
Sodium: 138 mmol/L (ref 135–145)

## 2018-12-18 LAB — CBC
HEMATOCRIT: 42.3 % (ref 36.0–46.0)
Hemoglobin: 13.2 g/dL (ref 12.0–15.0)
MCH: 29.7 pg (ref 26.0–34.0)
MCHC: 31.2 g/dL (ref 30.0–36.0)
MCV: 95.3 fL (ref 80.0–100.0)
Platelets: 242 10*3/uL (ref 150–400)
RBC: 4.44 MIL/uL (ref 3.87–5.11)
RDW: 12.1 % (ref 11.5–15.5)
WBC: 8 10*3/uL (ref 4.0–10.5)
nRBC: 0 % (ref 0.0–0.2)

## 2018-12-18 LAB — TSH: TSH: 2.377 u[IU]/mL (ref 0.350–4.500)

## 2018-12-18 LAB — I-STAT BETA HCG BLOOD, ED (MC, WL, AP ONLY): I-stat hCG, quantitative: <5 m[IU]/mL (ref <5)

## 2018-12-18 LAB — PHOSPHORUS: Phosphorus: 3 mg/dL (ref 2.5–4.6)

## 2018-12-18 LAB — MAGNESIUM: Magnesium: 2.2 mg/dL (ref 1.7–2.4)

## 2018-12-18 MED ORDER — HYDROCODONE-ACETAMINOPHEN 5-325 MG PO TABS
1.0000 | ORAL_TABLET | ORAL | Status: DC | PRN
  Filled 2018-12-18: qty 2

## 2018-12-18 MED ORDER — SODIUM CHLORIDE 0.9 % IV SOLN
1000.0000 mg | Freq: Every day | INTRAVENOUS | Status: AC
  Administered 2018-12-19 – 2018-12-20 (×2): 1000 mg via INTRAVENOUS
  Filled 2018-12-18 (×2): qty 8

## 2018-12-18 MED ORDER — ENOXAPARIN SODIUM 40 MG/0.4ML ~~LOC~~ SOLN
40.0000 mg | SUBCUTANEOUS | Status: DC
  Filled 2018-12-18 (×2): qty 0.4

## 2018-12-18 MED ORDER — ACETAMINOPHEN 325 MG PO TABS: 650.0000 mg | ORAL_TABLET | Freq: Four times a day (QID) | ORAL | Status: DC | PRN

## 2018-12-18 MED ORDER — PANTOPRAZOLE SODIUM 40 MG PO TBEC
40.0000 mg | DELAYED_RELEASE_TABLET | Freq: Every day | ORAL | Status: DC
  Administered 2018-12-19 – 2018-12-22 (×4): 40 mg via ORAL
  Filled 2018-12-18 (×4): qty 1

## 2018-12-18 MED ORDER — SENNOSIDES-DOCUSATE SODIUM 8.6-50 MG PO TABS: 1.0000 | ORAL_TABLET | Freq: Every evening | ORAL | Status: DC | PRN

## 2018-12-18 MED ORDER — GADOBUTROL 1 MMOL/ML IV SOLN
7.0000 mL | Freq: Once | INTRAVENOUS | Status: AC | PRN
  Administered 2018-12-18: 7 mL via INTRAVENOUS

## 2018-12-18 MED ORDER — SODIUM CHLORIDE 0.9 % IV SOLN
1000.0000 mg | Freq: Once | INTRAVENOUS | Status: AC
  Administered 2018-12-18: 1000 mg via INTRAVENOUS
  Filled 2018-12-18 (×2): qty 8

## 2018-12-18 MED ORDER — ONDANSETRON HCL 4 MG/2ML IJ SOLN: 4.0000 mg | Freq: Four times a day (QID) | INTRAMUSCULAR | Status: DC | PRN

## 2018-12-18 MED ORDER — ACETAMINOPHEN 650 MG RE SUPP: 650.0000 mg | Freq: Four times a day (QID) | RECTAL | Status: DC | PRN

## 2018-12-18 MED ORDER — ONDANSETRON HCL 4 MG PO TABS: 4.0000 mg | ORAL_TABLET | Freq: Four times a day (QID) | ORAL | Status: DC | PRN

## 2018-12-18 MED ORDER — SODIUM CHLORIDE 0.9 % IV SOLN: 1000.0000 mg | Freq: Every day | INTRAVENOUS | Status: DC

## 2018-12-18 NOTE — ED Provider Notes (Signed)
[facility] EMERGENCY DEPARTMENT Provider Note   CSN: [id number] Arrival date & time: 12/18/18  1144   History   Chief Complaint Chief Complaint  Patient presents with  . Numbness    HPI Elani Delph is a 33 y.o. female without significant past medical hx who presents to the ED with chief complaint of paresthesias/numbness that started 2 days prior. Patient states she woke up and gradually noted numbness/paresthesias to the RLE extending to the buttocks/lower back & to the posterior aspect of the R upper arm. She states that the next day the sensation seemed to progress across the back/buttocks & into the pelvic/genital region extending into the upper portion of the LLE. Today the sensation progressed down the LLE & is now affecting the entire LLE & progressed down the RUE and is now affected the entire RUE. She currently has numbness/paresthesias from the area just below the umbilicus distally as well as to the entirety of the RUE. No specific alleviating/aggravating factors. No intervention PTA. Patient does not necessarily feel the numb areas are weak, she is just more cautious to utilize them. She has not had gait difficulty. She states she has not lost control of bowel/bladder function- no retention/incontinence, but this AM she was having a bowel movement and was unsure if she was completely finished secondary to decreased sensation. Denies headache, change in vision, dizziness, weakness, chest pain, dyspnea, fever, chills, hx of cancer, or prior IVDU. She did receive botox 1 month prior, but had no issues with this. No other new medications/foods/exposures. No recent GI/GU illnesses. No recent foreign travel. No family hx of neurologic disorders that she is aware of.   HPI  Past Medical History:  Diagnosis Date  . Hx of varicella   . Medical history non-contributory     Patient Active Problem List   Diagnosis Date Noted  . Prolapsed internal hemorrhoids, grade 3  05/14/2018  . External hemorrhoids with complication 09/32/3557  . Normal labor and delivery 02/14/2016  . Pregnancy 02/13/2016  . Family history of diabetes mellitus type II 11/14/2012    Past Surgical History:  Procedure Laterality Date  . FACIAL COSMETIC SURGERY     dog bite  . HEMORRHOID SURGERY N/A 05/14/2018   Procedure: HEMORRHOIDECTOMY;  Surgeon: Michael Boston, MD;  Location: [facility];  Service: General;  Laterality: N/A;  . INNER EAR SURGERY    . PEXY N/A 05/14/2018   Procedure: HEMORRHOIDAL LIGATION/PEXY;  Surgeon: Michael Boston, MD;  Location: [facility];  Service: General;  Laterality: N/A;  . RECTAL EXAM UNDER ANESTHESIA N/A 05/14/2018   Procedure: ANORECTAL EXAM UNDER ANESTHESIA;  Surgeon: Michael Boston, MD;  Location: [facility];  Service: General;  Laterality: N/A;  . TUBES IN EARS       OB History    Gravida  1   Para  1   Term  1   Preterm      AB      Living  1     SAB      TAB      Ectopic      Multiple  0   Live Births  1            Home Medications    Prior to Admission medications   Medication Sig Start Date End Date Taking? Authorizing Provider  AMBULATORY NON FORMULARY MEDICATION Place 1 application rectally 4 (four) times daily. DILTIAZEM 2% compounded suspension.  (Get Rx filled at a compounding pharmacy, such as  [facility] [phone] or  [facility] [phone] in [place], [place]) Patient not taking: Reported on 12/18/2018 05/14/18   Michael Boston, MD  naproxen (NAPROSYN) 500 MG tablet Take 1 tablet (500 mg total) by mouth 2 (two) times daily with a meal. Patient not taking: Reported on 12/18/2018 05/14/18   Michael Boston, MD  oxyCODONE (OXY IR/ROXICODONE) 5 MG immediate release tablet Take 1-2 tablets (5-10 mg total) by mouth every 6 (six) hours as needed for moderate pain, severe pain or breakthrough pain. Patient not taking: Reported on 12/18/2018 05/14/18   Michael Boston, MD    Family History Family History    Problem Relation Age of Onset  . Cancer Father        TONSIL   . Hypertension Father   . Diabetes Father   . Cancer Paternal Aunt        SKIN  . Heart disease Maternal Grandmother   . Cancer Paternal Grandmother        LUNG AND LIVER  . Stroke Paternal Grandmother   . Diabetes Mother     Social History Social History   Tobacco Use  . Smoking status: Former Research scientist (life sciences)  . Smokeless tobacco: Never Used  . Tobacco comment: Last smoked 5-19, just one cigarette  Substance Use Topics  . Alcohol use: Yes    Comment: SOCIAL - WEEK ENDS   . Drug use: No     Allergies   Sulfa antibiotics   Review of Systems Review of Systems  Constitutional: Negative for chills and fever.  Respiratory: Negative for shortness of breath.   Cardiovascular: Negative for chest pain.  Gastrointestinal: Negative for abdominal pain.  Genitourinary: Negative for dysuria.  Neurological: Positive for numbness. Negative for dizziness, syncope, facial asymmetry, weakness and headaches.       Negative for incontinence/retention.   Psychiatric/Behavioral: Negative for confusion.  All other systems reviewed and are negative.    Physical Exam Updated Vital Signs BP 113/73 (BP Location: Left Arm)   Pulse 81   Temp 98.5 F (36.9 C) (Oral)   Resp 18   LMP 11/17/2018 (Approximate)   SpO2 99%   Physical Exam Vitals signs and nursing note reviewed. Exam conducted with a chaperone present.  Constitutional:      General: She is not in acute distress.    Appearance: She is well-developed. She is not toxic-appearing.  HENT:     Head: Normocephalic and atraumatic.  Eyes:     General:        Right eye: No discharge.        Left eye: No discharge.     Conjunctiva/sclera: Conjunctivae normal.  Neck:     Musculoskeletal: Neck supple.  Cardiovascular:     Rate and Rhythm: Normal rate and regular rhythm.  Pulmonary:     Effort: Pulmonary effort is normal. No respiratory distress.     Breath sounds: Normal  breath sounds. No wheezing, rhonchi or rales.  Abdominal:     General: There is no distension.     Palpations: Abdomen is soft.     Tenderness: There is no abdominal tenderness.  Genitourinary:    Rectum: External hemorrhoid present. Normal anal tone.     Comments: Good intact rectal tone.  Skin:    General: Skin is warm and dry.     Findings: No rash.  Neurological:     Mental Status: She is alert and oriented to person, place, and time.     Comments: Clear speech.  CN III-XII grossly intact. PERRL. EOMI. Negative pronator drift. Normal finger to nose. Patient has subjective decreased sensation from T10 dermatome distally including saddle region. Additionally with subjective decreased sensation to the R shoulder distally. Sensation intact to sharp/dull touch throughout. 5/5 strength to all upper/lower extremity muscle groups which is symmetric. 2+ patellar DTRs. Ambulatory without foot drop.   Psychiatric:        Behavior: Behavior normal.      ED Treatments / Results  Labs (all labs ordered are listed, but only abnormal results are displayed) Labs Reviewed  CBC  BASIC METABOLIC PANEL  MAGNESIUM  TSH  I-STAT BETA HCG BLOOD, ED ([facility], [facility], [facility] ONLY)    EKG None  Radiology No results found.  Procedures Procedures (including critical care time)  Medications Ordered in ED Medications - No data to display   Initial Impression / Assessment and Plan / ED Course  I have reviewed the triage vital signs and the nursing notes.  Pertinent labs & imaging results that were available during my care of the patient were reviewed by me and considered in my medical decision making (see chart for details).   Patient presents with paresthesias. Nontoxic appearing, in no apparent distress, vitals WNL. Exam with subjective decreased sensation as detailed above, able to discriminate between sharp/dull, good strength throughout. Rectal tone is intact. DDX: electrolyte disturbance, anemia, thyroid  dysfunction, multiple sclerosis, cauda equina, ischemic/hemorrhagic stroke.   Labs reviewed and unremarkable: CBC: no anemia or leukocytosis BMP: no electrolyte dysfunction Mg: WNL TSH: WNL  Received call from MRI technician Hope- patient has lesions concerning for MS- she has discussed with radiologist who would like MRI w/wo of the brain & as much of the spine as patient can tolerate- I have co-signed orders  15:35: CONSULT: Discussed case with neurologist Dr. Lorraine Lax- recommendation for 1 G methylprednisolone daily x 3 days, will see the patient in consultation.   16:30: CONSULT: Discussed case with hospitalist Dr. Alfredia Ferguson who accepts admission.   Patient returned from MRI, formal radiology interpretation pending at time of admission- we discussed results thus far, treatment plan as well as need for admission. Provided opportunity for questions, patient confirmed understanding & is in agreement.   Findings and plan of care discussed with supervising physician Dr. Gilford Raid- in agreement.   Final Clinical Impressions(s) / ED Diagnoses   Final diagnoses:  Multiple sclerosis [facility])    ED Discharge Orders    None       Leafy Kindle 12/18/18 1857    Isla Pence, MD 12/23/18 1523

## 2018-12-18 NOTE — ED Triage Notes (Signed)
Pt reports generalized numbness that began on the right side Tuesday, reports it is progressively getting worse today and that her whole body is starting to feel numb, describes it as tingling like when your foot falls asleep. Pt reports she wasn't able to tell if she was done having a bowel movement this morning due to decreased sensation, states she still does have control of her bladder and bowels. Denies any blurred vision.

## 2018-12-18 NOTE — H&P (Signed)
History and Physical    Chloe Pittman YFV:[id number] DOB: March 02, 1986 DOA: 12/18/2018  PCP: Dineen Kid, MD; Dr. Harrington Challenger  Patient coming from: Home  Chief Complaint: Numbness and Weakness  HPI: Chloe Pittman is a 33 y.o. female with medical history significant of the no past medical history except former tobacco abuse history of varicella, history of external hemorrhoids status post hemorrhoidectomy and other comorbidities chief complaint of numbness and paresthesias that started 2 days ago.  Patient states that she was at work when suddenly she started developing this numbness and paresthesia in the right lower extremity and it started with her foot.  This extended to to her right side and states she was on the back of her arm.  She states that this sensation then progressively worsened and extended to the buttocks and lower back in the posterior aspect of the right arm as well as then across the hips into the pelvic and genital region as well as a portion onto the left lower extremity.  Because a sensation progressed and started worsening down to the left lower extremity and affecting the left lower side but has not affected the left upper extremity she presented to her PCP office who then directed her to the emergency room.  She also had paresthesias and numbness along with weakness from the area just below the umbilicus.  She states that she has not fallen anywhere had any gait abnormalities or ataxia.  Has not lost any bowel or bladder function but this morning she was having bowel movement is unsure if she is completely filled secondary to decreased sensation in her buttocks.  She denies any headache changes in vision dizziness weakness lightheadedness but states that she did have a headache when she did get Botox a month ago.  Has lived in [place] most of her life until 8 years ago when she moved to [place].  Had not been recently sick and has not been exposed any new medications, foods has not  traveled outside the country.  Because of her numbness she underwent MRI imaging which showed likely new onset multiple sclerosis.  [facility] was called admit this patient for new onset multiple sclerosis and neurology is consulted and recommended placing the patient on 1 mg of IV Solu-Medrol daily for 3 days.  ED Course: The ED she had basic blood work done along with an MRI of the brain, L-spine, T-spine, C-spine as well as discussion with neurology.  She is also given a dose of IV Solu-Medrol thousand milligrams.  Review of Systems: As per HPI otherwise 10 point review of systems negative.   Past Medical History:  Diagnosis Date  . Hx of varicella   . Medical history non-contributory    Past Surgical History:  Procedure Laterality Date  . FACIAL COSMETIC SURGERY     dog bite  . HEMORRHOID SURGERY N/A 05/14/2018   Procedure: HEMORRHOIDECTOMY;  Surgeon: Michael Boston, MD;  Location: [facility];  Service: General;  Laterality: N/A;  . INNER EAR SURGERY    . PEXY N/A 05/14/2018   Procedure: HEMORRHOIDAL LIGATION/PEXY;  Surgeon: Michael Boston, MD;  Location: [facility];  Service: General;  Laterality: N/A;  . RECTAL EXAM UNDER ANESTHESIA N/A 05/14/2018   Procedure: ANORECTAL EXAM UNDER ANESTHESIA;  Surgeon: Michael Boston, MD;  Location: [facility];  Service: General;  Laterality: N/A;  . TUBES IN EARS     SOCIAL HISTORY Reports that she has quit smoking 2 years ago. She has never used smokeless tobacco.  She reports current alcohol use. She reports that she does not use drugs.  Allergies  Allergen Reactions  . Sulfa Antibiotics Nausea Only   Family History  Problem Relation Age of Onset  . Cancer Father        TONSIL   . Hypertension Father   . Diabetes Father   . Cancer Paternal Aunt        SKIN  . Heart disease Maternal Grandmother   . Cancer Paternal Grandmother        LUNG AND LIVER  . Stroke Paternal Grandmother   . Diabetes Mother    Prior to Admission medications   Medication Sig Start  Date End Date Taking? Authorizing Provider  AMBULATORY NON FORMULARY MEDICATION Place 1 application rectally 4 (four) times daily. DILTIAZEM 2% compounded suspension.  (Get Rx filled at a compounding pharmacy, such as  [facility] [phone] or  [facility] [phone] in [place], [place]) Patient not taking: Reported on 12/18/2018 05/14/18   Michael Boston, MD  naproxen (NAPROSYN) 500 MG tablet Take 1 tablet (500 mg total) by mouth 2 (two) times daily with a meal. Patient not taking: Reported on 12/18/2018 05/14/18   Michael Boston, MD  oxyCODONE (OXY IR/ROXICODONE) 5 MG immediate release tablet Take 1-2 tablets (5-10 mg total) by mouth every 6 (six) hours as needed for moderate pain, severe pain or breakthrough pain. Patient not taking: Reported on 12/18/2018 05/14/18   Michael Boston, MD   Physical Exam: Vitals:   12/18/18 1330 12/18/18 1345 12/18/18 1400 12/18/18 1415  BP:      Pulse: 69 62 69 71  Resp:      Temp:      TempSrc:      SpO2: 100% 100% 100% 100%   Constitutional: WN/WD Caucasian female who is extremely tearful and upset due to finding out about her MS Eyes: PERRL, lids and conjunctivae normal, sclerae anicteric  ENMT: External Ears, Nose appear normal. Grossly normal hearing. Mucous membranes are moist.  Neck: Appears normal, supple, no cervical masses, normal ROM, no appreciable thyromegaly; no JVD Respiratory: Diminished to auscultation bilaterally, no wheezing, rales, rhonchi or crackles. Normal respiratory effort and patient is not tachypenic. No accessory muscle use.  Cardiovascular: RRR, no murmurs / rubs / gallops. S1 and S2 auscultated. No extremity edema. 2+ pedal pulses. No carotid bruits.  Abdomen: Soft, non-tender, non-distended. No masses palpated. No appreciable hepatosplenomegaly. Bowel sounds positive x4.  GU: Deferred. Musculoskeletal: No clubbing / cyanosis of digits/nails. No joint deformity upper and lower extremities.  Skin: No rashes,  lesions, ulcers on a limited skin evaluation. No induration; Warm and dry.  Neurologic: CN 2-12 grossly intact with no focal deficits. Sensation diminished in 3 of the  4 Extremities, DTR hyperreflexic. Strength 4/5 in Right LE and 4/5 in Left Upper. Romberg sign and cerebellar reflexes not assessed.  Psychiatric: Normal judgment and insight. Alert and oriented x 3. Tearful mood and appropriate affect.   Labs on Admission: I have personally reviewed following labs and imaging studies  CBC: Recent Labs  Lab 12/18/18 1210  WBC 8.0  HGB 13.2  HCT 42.3  MCV 95.3  PLT 295   Basic Metabolic Panel: Recent Labs  Lab 12/18/18 1210  NA 138  K 4.0  CL 105  CO2 25  GLUCOSE 85  BUN 14  CREATININE 0.89  CALCIUM 9.3  MG 2.2   GFR: CrCl cannot be calculated (Unknown ideal weight.). Liver Function Tests: No results for input(s): AST,  ALT, ALKPHOS, BILITOT, PROT, ALBUMIN in the last 168 hours. No results for input(s): LIPASE, AMYLASE in the last 168 hours. No results for input(s): AMMONIA in the last 168 hours. Coagulation Profile: No results for input(s): INR, PROTIME in the last 168 hours. Cardiac Enzymes: No results for input(s): CKTOTAL, CKMB, CKMBINDEX, TROPONINI in the last 168 hours. BNP (last 3 results) No results for input(s): PROBNP in the last 8760 hours. HbA1C: No results for input(s): HGBA1C in the last 72 hours. CBG: No results for input(s): GLUCAP in the last 168 hours. Lipid Profile: No results for input(s): CHOL, HDL, LDLCALC, TRIG, CHOLHDL, LDLDIRECT in the last 72 hours. Thyroid Function Tests: Recent Labs    12/18/18 1230  TSH 2.377   Anemia Panel: No results for input(s): VITAMINB12, FOLATE, FERRITIN, TIBC, IRON, RETICCTPCT in the last 72 hours. Urine analysis: No results found for: COLORURINE, APPEARANCEUR, LABSPEC, PHURINE, GLUCOSEU, HGBUR, BILIRUBINUR, KETONESUR, PROTEINUR, UROBILINOGEN, NITRITE, LEUKOCYTESUR Sepsis Labs:  !!!!!!!!!!!!!!!!!!!!!!!!!!!!!!!!!!!!!!!!!!!! @LABRCNTIP (procalcitonin:4,lacticidven:4) )No results found for this or any previous visit (from the past 240 hour(s)).   Radiological Exams on Admission: No results found.  EKG: No EKG Done on Admission so will order one now.   Assessment/Plan Active Problems:   Multiple sclerosis (HCC)  New Onset Multiple Sclerosis given her Multiple Parasthesias/Weakness complaints  -Neurology reviewed MRI and seems consistent with MS -MRI of Brain, C Spine, T Spine, and L Spine pending official Read but Dr. Lorraine Lax evaluated the MRI and states that she definitively has MS -Started the patient on 1000 mg of IV Solumedrol x3 Days -Further Care per Neurology and appreciate further recommendations and evaluation -PT/OT to Evaluate and Treat  DVT prophylaxis: Enoxaparin 40 mg sq q24h Code Status: FULL CODE Family Communication: No family present at bedside  Disposition Plan: Anticipate D/C Home in the next 3 days after completion of IV Solumedrol  Consults called: Neurology Dr. Lorraine Lax Admission status: Inpatient Med-Surge  Severity of Illness: The appropriate patient status for this patient is INPATIENT. Inpatient status is judged to be reasonable and necessary in order to provide the required intensity of service to ensure the patient's safety. The patient's presenting symptoms, physical exam findings, and initial radiographic and laboratory data in the context of their chronic comorbidities is felt to place them at high risk for further clinical deterioration. Furthermore, it is not anticipated that the patient will be medically stable for discharge from the hospital within 2 midnights of admission. The following factors support the patient status of inpatient.   " The patient's presenting symptoms include Parasthesias/Numbness and Weakness. " The worrisome physical exam findings include Diminished sensastion on Right . " The initial radiographic and  laboratory data are worrisome because of MS. " The chronic co-morbidities are listed as above.  * I certify that at the point of admission it is my clinical judgment that the patient will require inpatient hospital care spanning beyond 2 midnights from the point of admission due to high intensity of service, high risk for further deterioration and high frequency of surveillance required.Kerney Elbe, D.O. [facility] PAGER is on [place]  If 7PM-7AM, please contact night-coverage [email or web address] Password [facility]  12/18/2018, 4:06 PM

## 2018-12-18 NOTE — Consult Note (Signed)
Requesting Physician: Dr. Alfredia Ferguson    Chief Complaint: Numbness over both legs, right arm  History obtained from: Patient and Chart     HPI:                                                                                                                                       Chloe Pittman is an 33 y.o. female healthy female with no significant past medical history presents with with numbness and paresthesias.  Symptoms started 2 days ago when patient was at work and suddenly developed numbness and paresthesias of her right leg which gradually extended to her arm and shoulders.  Her symptoms progressed to include the left leg as well as completely below her waist down.  She also noticed that she was more clumsy with the right arm.  Went to her PCP who directed the patient to go to the emergency room.  In the emergency room patient underwent MRI brain as well as CT, T, L-spine which showed multiple demyelinating lesions suggestive for MS.  Neurology was consulted for further recommendations.  Patient denies any episodes of numbness tingling in the past.  Denies any episodes of loss of vision, migraine headaches, memory loss.  No family history of MS.   Past Medical History:  Diagnosis Date  . Hx of varicella   . Medical history non-contributory     Past Surgical History:  Procedure Laterality Date  . FACIAL COSMETIC SURGERY     dog bite  . HEMORRHOID SURGERY N/A 05/14/2018   Procedure: HEMORRHOIDECTOMY;  Surgeon: Michael Boston, MD;  Location: [facility];  Service: General;  Laterality: N/A;  . INNER EAR SURGERY    . PEXY N/A 05/14/2018   Procedure: HEMORRHOIDAL LIGATION/PEXY;  Surgeon: Michael Boston, MD;  Location: [facility];  Service: General;  Laterality: N/A;  . RECTAL EXAM UNDER ANESTHESIA N/A 05/14/2018   Procedure: ANORECTAL EXAM UNDER ANESTHESIA;  Surgeon: Michael Boston, MD;  Location: [facility];  Service: General;  Laterality: N/A;  . TUBES IN EARS      Family History  Problem Relation  Age of Onset  . Cancer Father        TONSIL   . Hypertension Father   . Diabetes Father   . Cancer Paternal Aunt        SKIN  . Heart disease Maternal Grandmother   . Cancer Paternal Grandmother        LUNG AND LIVER  . Stroke Paternal Grandmother   . Diabetes Mother    Social History:  reports that she has quit smoking. She has never used smokeless tobacco. She reports current alcohol use. She reports that she does not use drugs.  Allergies:  Allergies  Allergen Reactions  . Sulfa Antibiotics Nausea Only    Medications:  I reviewed home medications*  ROS:                                                                                                                                     14 systems reviewed and negative except above    Examination:                                                                                                      General: Appears well-developed and well-nourished.  Psych: Affect appropriate to situation Eyes: No scleral injection HENT: No OP obstrucion Head: Normocephalic.  Cardiovascular: Normal rate and regular rhythm.  Respiratory: Effort normal and breath sounds normal to anterior ascultation GI: Soft.  No distension. There is no tenderness.  Skin: WDI    Neurological Examination Mental Status: Alert, oriented, thought content appropriate.  Speech fluent without evidence of aphasia. Able to follow 3 step commands without difficulty. Cranial Nerves: II: Visual fields grossly normal,  III,IV, VI: ptosis not present, extra-ocular motions intact bilaterally, pupils equal, round, reactive to light and accommodation V,VII: smile symmetric, facial light touch sensation normal bilaterally VIII: hearing normal bilaterally IX,X: uvula rises symmetrically XI: bilateral shoulder shrug XII: midline tongue  extension Motor: Right : Upper extremity   5/5    Left:     Upper extremity   5/5  Lower extremity   5/5     Lower extremity   5/5 Tone and bulk:normal tone throughout; no atrophy noted Sensory: reduced sensation over right arm, shoulder, as well as below t12- L1 sensory level  Deep Tendon Reflexes: 3+ and symmetric throughout Plantars: Right: downgoing   Left: downgoing Cerebellar: Dysmetria with finger-to-nose testing in the right arm, normal finger-nose testing on the left side. Gait: normal gait and station     Lab Results: Basic Metabolic Panel: Recent Labs  Lab 12/18/18 1210 12/18/18 1924  NA 138  --   K 4.0  --   CL 105  --   CO2 25  --   GLUCOSE 85  --   BUN 14  --   CREATININE 0.89  --   CALCIUM 9.3  --   MG 2.2  --   PHOS  --  3.0    CBC: Recent Labs  Lab 12/18/18 1210  WBC 8.0  HGB 13.2  HCT 42.3  MCV 95.3  PLT 242    Coagulation Studies: No results for input(s): LABPROT, INR in the last 72 hours.  Imaging: Mr Jeri Cos AL Contrast  Result Date: 12/18/2018 CLINICAL DATA:  33 y/o F; generalized numbness that began on the right side Tuesday with progressive worsening, now involving the whole body. EXAM: MRI HEAD WITHOUT AND WITH CONTRAST MRI CERVICAL SPINE WITHOUT AND WITH CONTRAST MRI THORACIC WITHOUT AND WITH CONTRAST MRI LUMBAR SPINE WITHOUT AND WITH CONTRAST TECHNIQUE: Multiplanar, multiecho pulse sequences of the brain and surrounding structures, and cervical spine, to include the craniocervical junction and cervicothoracic junction, were obtained without and with intravenous contrast. Multiplanar and multiecho pulse sequences of the thoracic and lumbar spine were obtained without and with intravenous contrast. CONTRAST:  7 cc Gadavist COMPARISON:  None. FINDINGS: MRI HEAD FINDINGS Brain: Numerous T2 FLAIR hyperintense white matter lesions are present (greater than 20) involving juxta cortical white matter, periventricular white matter, corpus callosum  (genu, body, splenium), left greater than right genu of internal capsule, left medial thalamus, left superior cerebellar peduncle and medial cerebellar white matter, right brachium pontis, and the left hemi pons. Periventricular lesions have an ovoid radial configuration relative to the ventricular system. After administration of intravenous contrast there is faint enhancement of 2 lesions in left frontal periventricular and left parietal subcortical white matter (series 70, image 7 and 9) as well as avid enhancement of a lesion within the right superior temporal gyrus (series 70, image 5). No structural abnormality of the brain identified. No findings of stroke, hemorrhage, mass effect, extra-axial collection, hydrocephalus, or herniation. Vascular: Right postcentral gyrus developmental venous anomaly. Skull and upper cervical spine: Normal marrow signal. Sinuses/Orbits: Mild left maxillary sinus mucosal thickening. No additional abnormal signal of visible paranasal sinuses or the mastoid air cells. Orbits are unremarkable. Other: Negative. MRI CERVICAL SPINE FINDINGS Alignment: Straightening of cervical lordosis without listhesis. Vertebrae: No fracture, evidence of discitis, or bone lesion. No abnormal enhancement. Cord: No abnormal enhancement. Short segment T2 hyperintense cord lesions as follows: 1. Right lateral cord, C2-3 2. Dorsal column in right posterior horn, C4 3. Left central cord extending to left posterior column, C6 Posterior Fossa, vertebral arteries, paraspinal tissues: Negative. Disc levels: No significant disc displacement, foraminal stenosis, or canal stenosis. MRI THORACIC SPINE FINDINGS Alignment:  Physiologic. Vertebrae: No fracture, evidence of discitis, or bone lesion. No abnormal enhancement. Cord: Mild motion artifact of axial sequences. Normal signal and morphology. No abnormal enhancement Paraspinal and other soft tissues: Negative. Disc levels: Small right central disc protrusion with  ventral thecal sac effacement. No additional level of disc displacement. No foraminal stenosis, canal stenosis, or neural impingement. MRI LUMBAR SPINE FINDINGS Segmentation:  Standard. Alignment:  Physiologic. Vertebrae: No fracture, evidence of discitis, or bone lesion. No abnormal enhancement. Conus medullaris: Extends to the L1-2 level and appears normal. No abnormal enhancement. Paraspinal and other soft tissues: Negative. Disc levels: No significant disc displacement, foraminal stenosis, or canal stenosis. IMPRESSION: MRI head: Numerous white matter lesions (>20) and a pattern typical of demyelination and multiple sclerosis. Enhancement of 3 lesions in the right brain most pronounced in right superior temporal lobe. Findings satisfy revised McDonald criteria for dissemination in time and space. Differential considerations such as ADEM, NMO, Lyme disease, or vasculopathy are considered unlikely. MRI cervical spine: Three short segment nonenhancing spinal cord lesions in a pattern typical of multiple sclerosis as above. MRI thoracic spine: No spinal cord lesion identified. Mild motion artifact. Unremarkable thoracic spine MRI. MRI lumbar spine: Normal lumbar spine MRI. Electronically Signed   By: Kristine Garbe M.D.   On: 12/18/2018 17:36   Mr Cervical Spine W Wo Contrast  Result Date:  12/18/2018 CLINICAL DATA:  33 y/o F; generalized numbness that began on the right side Tuesday with progressive worsening, now involving the whole body. EXAM: MRI HEAD WITHOUT AND WITH CONTRAST MRI CERVICAL SPINE WITHOUT AND WITH CONTRAST MRI THORACIC WITHOUT AND WITH CONTRAST MRI LUMBAR SPINE WITHOUT AND WITH CONTRAST TECHNIQUE: Multiplanar, multiecho pulse sequences of the brain and surrounding structures, and cervical spine, to include the craniocervical junction and cervicothoracic junction, were obtained without and with intravenous contrast. Multiplanar and multiecho pulse sequences of the thoracic and lumbar  spine were obtained without and with intravenous contrast. CONTRAST:  7 cc Gadavist COMPARISON:  None. FINDINGS: MRI HEAD FINDINGS Brain: Numerous T2 FLAIR hyperintense white matter lesions are present (greater than 20) involving juxta cortical white matter, periventricular white matter, corpus callosum (genu, body, splenium), left greater than right genu of internal capsule, left medial thalamus, left superior cerebellar peduncle and medial cerebellar white matter, right brachium pontis, and the left hemi pons. Periventricular lesions have an ovoid radial configuration relative to the ventricular system. After administration of intravenous contrast there is faint enhancement of 2 lesions in left frontal periventricular and left parietal subcortical white matter (series 70, image 7 and 9) as well as avid enhancement of a lesion within the right superior temporal gyrus (series 70, image 5). No structural abnormality of the brain identified. No findings of stroke, hemorrhage, mass effect, extra-axial collection, hydrocephalus, or herniation. Vascular: Right postcentral gyrus developmental venous anomaly. Skull and upper cervical spine: Normal marrow signal. Sinuses/Orbits: Mild left maxillary sinus mucosal thickening. No additional abnormal signal of visible paranasal sinuses or the mastoid air cells. Orbits are unremarkable. Other: Negative. MRI CERVICAL SPINE FINDINGS Alignment: Straightening of cervical lordosis without listhesis. Vertebrae: No fracture, evidence of discitis, or bone lesion. No abnormal enhancement. Cord: No abnormal enhancement. Short segment T2 hyperintense cord lesions as follows: 1. Right lateral cord, C2-3 2. Dorsal column in right posterior horn, C4 3. Left central cord extending to left posterior column, C6 Posterior Fossa, vertebral arteries, paraspinal tissues: Negative. Disc levels: No significant disc displacement, foraminal stenosis, or canal stenosis. MRI THORACIC SPINE FINDINGS  Alignment:  Physiologic. Vertebrae: No fracture, evidence of discitis, or bone lesion. No abnormal enhancement. Cord: Mild motion artifact of axial sequences. Normal signal and morphology. No abnormal enhancement Paraspinal and other soft tissues: Negative. Disc levels: Small right central disc protrusion with ventral thecal sac effacement. No additional level of disc displacement. No foraminal stenosis, canal stenosis, or neural impingement. MRI LUMBAR SPINE FINDINGS Segmentation:  Standard. Alignment:  Physiologic. Vertebrae: No fracture, evidence of discitis, or bone lesion. No abnormal enhancement. Conus medullaris: Extends to the L1-2 level and appears normal. No abnormal enhancement. Paraspinal and other soft tissues: Negative. Disc levels: No significant disc displacement, foraminal stenosis, or canal stenosis. IMPRESSION: MRI head: Numerous white matter lesions (>20) and a pattern typical of demyelination and multiple sclerosis. Enhancement of 3 lesions in the right brain most pronounced in right superior temporal lobe. Findings satisfy revised McDonald criteria for dissemination in time and space. Differential considerations such as ADEM, NMO, Lyme disease, or vasculopathy are considered unlikely. MRI cervical spine: Three short segment nonenhancing spinal cord lesions in a pattern typical of multiple sclerosis as above. MRI thoracic spine: No spinal cord lesion identified. Mild motion artifact. Unremarkable thoracic spine MRI. MRI lumbar spine: Normal lumbar spine MRI. Electronically Signed   By: Kristine Garbe M.D.   On: 12/18/2018 17:36   Mr Thoracic Spine W Wo Contrast  Result Date: 12/18/2018 CLINICAL  DATA:  33 y/o F; generalized numbness that began on the right side Tuesday with progressive worsening, now involving the whole body. EXAM: MRI HEAD WITHOUT AND WITH CONTRAST MRI CERVICAL SPINE WITHOUT AND WITH CONTRAST MRI THORACIC WITHOUT AND WITH CONTRAST MRI LUMBAR SPINE WITHOUT AND WITH  CONTRAST TECHNIQUE: Multiplanar, multiecho pulse sequences of the brain and surrounding structures, and cervical spine, to include the craniocervical junction and cervicothoracic junction, were obtained without and with intravenous contrast. Multiplanar and multiecho pulse sequences of the thoracic and lumbar spine were obtained without and with intravenous contrast. CONTRAST:  7 cc Gadavist COMPARISON:  None. FINDINGS: MRI HEAD FINDINGS Brain: Numerous T2 FLAIR hyperintense white matter lesions are present (greater than 20) involving juxta cortical white matter, periventricular white matter, corpus callosum (genu, body, splenium), left greater than right genu of internal capsule, left medial thalamus, left superior cerebellar peduncle and medial cerebellar white matter, right brachium pontis, and the left hemi pons. Periventricular lesions have an ovoid radial configuration relative to the ventricular system. After administration of intravenous contrast there is faint enhancement of 2 lesions in left frontal periventricular and left parietal subcortical white matter (series 70, image 7 and 9) as well as avid enhancement of a lesion within the right superior temporal gyrus (series 70, image 5). No structural abnormality of the brain identified. No findings of stroke, hemorrhage, mass effect, extra-axial collection, hydrocephalus, or herniation. Vascular: Right postcentral gyrus developmental venous anomaly. Skull and upper cervical spine: Normal marrow signal. Sinuses/Orbits: Mild left maxillary sinus mucosal thickening. No additional abnormal signal of visible paranasal sinuses or the mastoid air cells. Orbits are unremarkable. Other: Negative. MRI CERVICAL SPINE FINDINGS Alignment: Straightening of cervical lordosis without listhesis. Vertebrae: No fracture, evidence of discitis, or bone lesion. No abnormal enhancement. Cord: No abnormal enhancement. Short segment T2 hyperintense cord lesions as follows: 1. Right  lateral cord, C2-3 2. Dorsal column in right posterior horn, C4 3. Left central cord extending to left posterior column, C6 Posterior Fossa, vertebral arteries, paraspinal tissues: Negative. Disc levels: No significant disc displacement, foraminal stenosis, or canal stenosis. MRI THORACIC SPINE FINDINGS Alignment:  Physiologic. Vertebrae: No fracture, evidence of discitis, or bone lesion. No abnormal enhancement. Cord: Mild motion artifact of axial sequences. Normal signal and morphology. No abnormal enhancement Paraspinal and other soft tissues: Negative. Disc levels: Small right central disc protrusion with ventral thecal sac effacement. No additional level of disc displacement. No foraminal stenosis, canal stenosis, or neural impingement. MRI LUMBAR SPINE FINDINGS Segmentation:  Standard. Alignment:  Physiologic. Vertebrae: No fracture, evidence of discitis, or bone lesion. No abnormal enhancement. Conus medullaris: Extends to the L1-2 level and appears normal. No abnormal enhancement. Paraspinal and other soft tissues: Negative. Disc levels: No significant disc displacement, foraminal stenosis, or canal stenosis. IMPRESSION: MRI head: Numerous white matter lesions (>20) and a pattern typical of demyelination and multiple sclerosis. Enhancement of 3 lesions in the right brain most pronounced in right superior temporal lobe. Findings satisfy revised McDonald criteria for dissemination in time and space. Differential considerations such as ADEM, NMO, Lyme disease, or vasculopathy are considered unlikely. MRI cervical spine: Three short segment nonenhancing spinal cord lesions in a pattern typical of multiple sclerosis as above. MRI thoracic spine: No spinal cord lesion identified. Mild motion artifact. Unremarkable thoracic spine MRI. MRI lumbar spine: Normal lumbar spine MRI. Electronically Signed   By: Kristine Garbe M.D.   On: 12/18/2018 17:36   Mr Lumbar Spine W Wo Contrast  Result Date:  12/18/2018 CLINICAL DATA:  33 y/o F; generalized numbness that began on the right side Tuesday with progressive worsening, now involving the whole body. EXAM: MRI HEAD WITHOUT AND WITH CONTRAST MRI CERVICAL SPINE WITHOUT AND WITH CONTRAST MRI THORACIC WITHOUT AND WITH CONTRAST MRI LUMBAR SPINE WITHOUT AND WITH CONTRAST TECHNIQUE: Multiplanar, multiecho pulse sequences of the brain and surrounding structures, and cervical spine, to include the craniocervical junction and cervicothoracic junction, were obtained without and with intravenous contrast. Multiplanar and multiecho pulse sequences of the thoracic and lumbar spine were obtained without and with intravenous contrast. CONTRAST:  7 cc Gadavist COMPARISON:  None. FINDINGS: MRI HEAD FINDINGS Brain: Numerous T2 FLAIR hyperintense white matter lesions are present (greater than 20) involving juxta cortical white matter, periventricular white matter, corpus callosum (genu, body, splenium), left greater than right genu of internal capsule, left medial thalamus, left superior cerebellar peduncle and medial cerebellar white matter, right brachium pontis, and the left hemi pons. Periventricular lesions have an ovoid radial configuration relative to the ventricular system. After administration of intravenous contrast there is faint enhancement of 2 lesions in left frontal periventricular and left parietal subcortical white matter (series 70, image 7 and 9) as well as avid enhancement of a lesion within the right superior temporal gyrus (series 70, image 5). No structural abnormality of the brain identified. No findings of stroke, hemorrhage, mass effect, extra-axial collection, hydrocephalus, or herniation. Vascular: Right postcentral gyrus developmental venous anomaly. Skull and upper cervical spine: Normal marrow signal. Sinuses/Orbits: Mild left maxillary sinus mucosal thickening. No additional abnormal signal of visible paranasal sinuses or the mastoid air cells.  Orbits are unremarkable. Other: Negative. MRI CERVICAL SPINE FINDINGS Alignment: Straightening of cervical lordosis without listhesis. Vertebrae: No fracture, evidence of discitis, or bone lesion. No abnormal enhancement. Cord: No abnormal enhancement. Short segment T2 hyperintense cord lesions as follows: 1. Right lateral cord, C2-3 2. Dorsal column in right posterior horn, C4 3. Left central cord extending to left posterior column, C6 Posterior Fossa, vertebral arteries, paraspinal tissues: Negative. Disc levels: No significant disc displacement, foraminal stenosis, or canal stenosis. MRI THORACIC SPINE FINDINGS Alignment:  Physiologic. Vertebrae: No fracture, evidence of discitis, or bone lesion. No abnormal enhancement. Cord: Mild motion artifact of axial sequences. Normal signal and morphology. No abnormal enhancement Paraspinal and other soft tissues: Negative. Disc levels: Small right central disc protrusion with ventral thecal sac effacement. No additional level of disc displacement. No foraminal stenosis, canal stenosis, or neural impingement. MRI LUMBAR SPINE FINDINGS Segmentation:  Standard. Alignment:  Physiologic. Vertebrae: No fracture, evidence of discitis, or bone lesion. No abnormal enhancement. Conus medullaris: Extends to the L1-2 level and appears normal. No abnormal enhancement. Paraspinal and other soft tissues: Negative. Disc levels: No significant disc displacement, foraminal stenosis, or canal stenosis. IMPRESSION: MRI head: Numerous white matter lesions (>20) and a pattern typical of demyelination and multiple sclerosis. Enhancement of 3 lesions in the right brain most pronounced in right superior temporal lobe. Findings satisfy revised McDonald criteria for dissemination in time and space. Differential considerations such as ADEM, NMO, Lyme disease, or vasculopathy are considered unlikely. MRI cervical spine: Three short segment nonenhancing spinal cord lesions in a pattern typical of  multiple sclerosis as above. MRI thoracic spine: No spinal cord lesion identified. Mild motion artifact. Unremarkable thoracic spine MRI. MRI lumbar spine: Normal lumbar spine MRI. Electronically Signed   By: Kristine Garbe M.D.   On: 12/18/2018 17:36     I have reviewed the above imaging:  MRI  brain as well as  CT and L-spine   ASSESSMENT AND PLAN  33 y.o. female with medical history significant of the no past medical history except former tobacco abuse history of varicella presents with numbness and paresthesias over the right arm and from waist down.  She has multiple enhancing and nonenhancing demyelinating lesions in the brain and spinal cord consistent with multiple sclerosis.   Multiple sclerosis -1 g Solu-Medrol into 3 to 5 days -Start PPI -We will defer decision whether to perform LP to outpatient neurologist -Check vitamin D levels -Need outpatient follow-up with Dr. Felecia Shelling to start disease modifying therapy.  We will continue to follow.  Keria Widrig [facility] Pager Number [phone]

## 2018-12-18 NOTE — Progress Notes (Signed)
Chloe Pittman [id number] Admission Data: 12/18/2018 6:16 PM Attending Provider: Kerney Elbe, DO  PCP:Via, Lennette Bihari, MD  Chloe Pittman is a 33 y.o. female patient admitted from ED awake, alert  & orientated  X 3,  Full Code, VSS - Blood pressure 123/69, pulse 84, temperature 98.5 F (36.9 C), temperature source Oral, resp. rate 18, last menstrual period 11/17/2018, SpO2 97 %, unknown if currently breastfeeding., on RA, no c/o shortness of breath, no c/o chest pain, no distress noted.  Pt orientation to unit, room and routine. Information packet given to patient/family and safety video watched.  Admission INP armband ID verified with patient/family, and in place. SR up x 2, fall risk assessment complete with Patient and family verbalizing understanding of risks associated with falls. Pt verbalizes an understanding of how to use the call bell and to call for help before getting out of bed.  Skin, clean-dry- intact without evidence of bruising, or skin tears.    Will cont to monitor and assist as needed.  Hiram Comber, RN 12/18/2018 6:16 PM

## 2018-12-18 NOTE — ED Notes (Signed)
Patient transported to MRI 

## 2018-12-18 NOTE — ED Notes (Signed)
Pt remains in MRI 

## 2018-12-19 ENCOUNTER — Telehealth: Payer: Self-pay | Admitting: *Deleted

## 2018-12-19 LAB — COMPREHENSIVE METABOLIC PANEL
ALK PHOS: 35 U/L — AB (ref 38–126)
ALT: 15 U/L (ref 0–44)
AST: 16 U/L (ref 15–41)
Albumin: 4.1 g/dL (ref 3.5–5.0)
Anion gap: 6 (ref 5–15)
BUN: 17 mg/dL (ref 6–20)
CALCIUM: 9 mg/dL (ref 8.9–10.3)
CO2: 23 mmol/L (ref 22–32)
Chloride: 110 mmol/L (ref 98–111)
Creatinine, Ser: 0.93 mg/dL (ref 0.44–1.00)
GFR calc Af Amer: >60 mL/min (ref >60)
GFR calc non Af Amer: >60 mL/min (ref >60)
Glucose, Bld: 149 mg/dL — ABNORMAL HIGH (ref 70–99)
Potassium: 4.1 mmol/L (ref 3.5–5.1)
Sodium: 139 mmol/L (ref 135–145)
Total Bilirubin: 0.8 mg/dL (ref 0.3–1.2)
Total Protein: 7.2 g/dL (ref 6.5–8.1)

## 2018-12-19 LAB — CBC
HCT: 39.5 % (ref 36.0–46.0)
Hemoglobin: 12.7 g/dL (ref 12.0–15.0)
MCH: 29.9 pg (ref 26.0–34.0)
MCHC: 32.2 g/dL (ref 30.0–36.0)
MCV: 92.9 fL (ref 80.0–100.0)
PLATELETS: 239 10*3/uL (ref 150–400)
RBC: 4.25 MIL/uL (ref 3.87–5.11)
RDW: 12.2 % (ref 11.5–15.5)
WBC: 3.8 10*3/uL — ABNORMAL LOW (ref 4.0–10.5)
nRBC: 0 % (ref 0.0–0.2)

## 2018-12-19 LAB — HEMOGLOBIN A1C
Hgb A1c MFr Bld: 5.4 % (ref 4.8–5.6)
Mean Plasma Glucose: 108.28 mg/dL

## 2018-12-19 LAB — GLUCOSE, CAPILLARY: Glucose-Capillary: 107 mg/dL — ABNORMAL HIGH (ref 70–99)

## 2018-12-19 LAB — HIV ANTIBODY (ROUTINE TESTING W REFLEX): HIV Screen 4th Generation wRfx: NONREACTIVE

## 2018-12-19 MED ORDER — IBUPROFEN 400 MG PO TABS
400.0000 mg | ORAL_TABLET | Freq: Four times a day (QID) | ORAL | Status: DC | PRN
  Administered 2018-12-19 – 2018-12-21 (×2): 400 mg via ORAL
  Filled 2018-12-19 (×2): qty 1

## 2018-12-19 NOTE — Progress Notes (Signed)
Occupational Therapy Evaluation Patient Details Name: Chloe Pittman MRN: [id number] DOB: November 27, 1985 Today's Date: 12/19/2018    History of Present Illness Patient is a 33 year old female sent to ED by her PCP for reports of numbness and parathesias that began 3 days ago and were progressive. Patient testing revealed spinal cord lesions consistent with MS.    Clinical Impression   PTA, pt was living at home with her 74-year-old daughter and husband, she was independent with ADLs and was working as a Copywriter, advertising. Pt currently able to complete ADL at mod independently and is modified independent with functional mobility without external device. However, pt requires increased time and effort for ADL/functional mobility due to decreased gross RUE strength and fine motor and gross motor coordination in RUE. Mild deficits with functional mobility noted. Pt reports tingling in RUE. Due to decline in current level of function, pt would benefit from acute OT to address established goals to facilitate safe D/C home. At this time, recommend outpatient OT follow-up.      Follow Up Recommendations  Outpatient OT    Equipment Recommendations  None recommended by OT    Recommendations for Other Services PT consult     Precautions / Restrictions Precautions Precautions: Fall Restrictions Weight Bearing Restrictions: No      Mobility Bed Mobility Overal bed mobility: Independent                Transfers Overall transfer level: Independent Equipment used: None                  Balance Overall balance assessment: Mild deficits observed, not formally tested                                         ADL either performed or assessed with clinical judgement   ADL Overall ADL's : Modified independent                                       General ADL Comments: pt able to perform ADL taking her time through movements and focusing on the task at  hand      Vision Baseline Vision/History: No visual deficits Patient Visual Report: No change from baseline Vision Assessment?: Yes Eye Alignment: Within Functional Limits Ocular Range of Motion: Within Functional Limits Alignment/Gaze Preference: Within Defined Limits Tracking/Visual Pursuits: Able to track stimulus in all quads without difficulty Saccades: Within functional limits Convergence: Within functional limits     Perception Perception Perception Tested?: No   Praxis      Pertinent Vitals/Pain Pain Assessment: No/denies pain     Hand Dominance Right   Extremity/Trunk Assessment Upper Extremity Assessment Upper Extremity Assessment: RUE deficits/detail RUE Deficits / Details: 4/5 grossly;decreased coordination with quick movements;able to use functionally with increased time and effort;reports tingling and different sensation states it feels like her RUE is asleep  RUE Sensation: WNL;decreased proprioception RUE Coordination: decreased fine motor;decreased gross motor   Lower Extremity Assessment Lower Extremity Assessment: Defer to PT evaluation RLE Deficits / Details: mild weakness noted on Right UE & LE RLE Sensation: decreased proprioception RLE Coordination: decreased fine motor;decreased gross motor   Cervical / Trunk Assessment Cervical / Trunk Assessment: Normal   Communication Communication Communication: No difficulties   Cognition Arousal/Alertness: Awake/alert Behavior During Therapy: [facility]  for tasks assessed/performed Overall Cognitive Status: Within Functional Limits for tasks assessed                                 General Comments: pt has done personal research on MS, verbalized a good understanding of her dx    General Comments  discussed possibility of poor mental health with lifelong dx, pt verbalized she has a great outlook now and is "appreciative of discussion so she is aware in the future if things change;    Exercises  Other Exercises Other Exercises: standing perturbations in all directions   Shoulder Instructions      Home Living Family/patient expects to be discharged to:: Private residence Living Arrangements: Spouse/significant other Available Help at Discharge: Family;Neighbor;Available PRN/intermittently Type of Home: House Home Access: Stairs to enter     Home Layout: Two level         Bathroom Toilet: Handicapped height     Home Equipment: None          Prior Functioning/Environment Level of Independence: Independent        Comments: patient working as Arboriculturist, has 80 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, fully independent prior        OT Problem List: Decreased strength;Impaired balance (sitting and/or standing);Impaired UE functional use      OT Treatment/Interventions: Therapeutic exercise;Therapeutic activities;Patient/family education;Balance training    OT Goals(Current goals can be found in the care plan section) Acute Rehab OT Goals Patient Stated Goal: to be able to care for her daughter safely OT Goal Formulation: With patient Time For Goal Achievement: 01/02/19 Potential to Achieve Goals: Good  OT Frequency: Min 2X/week   Barriers to D/C:            Co-evaluation              AM-PAC OT "6 Clicks" Daily Activity     Outcome Measure Help from another person eating meals?: None Help from another person taking care of personal grooming?: A Little Help from another person toileting, which includes using toliet, bedpan, or urinal?: A Little Help from another person bathing (including washing, rinsing, drying)?: A Little Help from another person to put on and taking off regular upper body clothing?: A Little Help from another person to put on and taking off regular lower body clothing?: A Little 6 Click Score: 19   End of Session Nurse Communication: Mobility status  Activity Tolerance: Patient tolerated treatment well Patient left: in bed;with bed alarm  set;with family/visitor present  OT Visit Diagnosis: Other abnormalities of gait and mobility (R26.89);Other symptoms and signs involving the nervous system (R29.898)                Time: 3646-8032 OT Time Calculation (min): 27 min Charges:  OT General Charges $OT Visit: 1 Visit OT Evaluation $OT Eval Low Complexity: 1 Low OT Treatments $Self Care/Home Management : 8-22 mins  Dorinda Hill OTR/L Acute Rehabilitation Services Office: [facility] [place] 12/19/2018, 1:21 PM

## 2018-12-19 NOTE — Progress Notes (Addendum)
NEURO HOSPITALIST PROGRESS NOTE   Subjective: Patient in bed, awake, alert, NAD. Patient admitted that she was "a mess" yesterday with her new diagnosis, but she is in much better spirits today, after having time to digest the news and to do some research on MS. Patient has received 1 dose of IV solumedrol, numbness is stable.  Exam: Vitals:   12/18/18 2043 12/19/18 0439  BP: 101/64 (!) 104/54  Pulse: 78 64  Resp: 16 16  Temp: 98.4 F (36.9 C) 97.9 F (36.6 C)  SpO2: 100% 99%    Physical Exam   HEENT-  Normocephalic, no lesions, without obvious abnormality.  Normal external eye and conjunctiva.   Cardiovascular- S1-S2 audible, pulses palpable throughout   Lungs-no rhonchi or wheezing noted, no excessive working breathing.  Saturations within normal limits on RA Abdomen- All 4 quadrants palpated and nontender Extremities- Warm, dry and intact Musculoskeletal-no joint tenderness, deformity or swelling Skin-warm and dryintact   Neuro:  Mental Status: Alert, oriented, thought content appropriate.  Speech fluent without evidence of aphasia. Able to follow  commands without difficulty. Cranial Nerves: II: Visual fields grossly normal,  III,IV, VI: ptosis not present, extra-ocular motions intact bilaterally, pupils equal, round, reactive to light and accommodation V,VII: smile symmetric, facial light touch sensation normal bilaterally VIII: hearing normal bilaterally IX,X: uvula rises symmetrically XI: bilateral shoulder shrug XII: midline tongue extension Motor: Right :  Upper extremity   5/5                       Left:     Upper extremity   5/5             Lower extremity   5/5                                     Lower extremity   5/5 Tone and bulk:normal tone throughout; no atrophy noted Sensory: Right arm with reduced sensation in dermatomes c7,8 and Th1. No numbness present in left arm. Numbness present in BLE in dermatomes L1-L5 and S1-S3. Deep Tendon  Reflexes: 2+ biceps, 3+patella Plantars: Right: downgoing                                Left: downgoing Cerebellar: Dysmetria with finger-to-nose testing in the right arm, normal finger-nose testing on the left side. Gait: deferred    Medications:  Scheduled: . enoxaparin (LOVENOX) injection  40 mg Subcutaneous Q24H  . pantoprazole  40 mg Oral Daily   Continuous: . methylPREDNISolone (SOLU-MEDROL) injection     JKD:[id number] **OR** acetaminophen, HYDROcodone-acetaminophen, ondansetron **OR** ondansetron (ZOFRAN) IV, senna-docusate  Pertinent Labs/Diagnostics: Vitamin D level: pending  Mr Jeri Cos Wo Contrast  Result Date: 12/18/2018 CLINICAL DATA:  33 y/o F; generalized numbness that began on the right side Tuesday with progressive worsening, now involving the whole body. EXAM: MRI HEAD WITHOUT AND WITH CONTRAST MRI CERVICAL SPINE WITHOUT AND WITH CONTRAST MRI THORACIC WITHOUT AND WITH CONTRAST MRI LUMBAR SPINE WITHOUT AND WITH CONTRAST TECHNIQUE: Multiplanar, multiecho pulse sequences of the brain and surrounding structures, and cervical spine, to include the craniocervical junction and cervicothoracic junction, were obtained without and with intravenous contrast. Multiplanar and multiecho pulse sequences of the thoracic and  lumbar spine were obtained without and with intravenous contrast. CONTRAST:  7 cc Gadavist COMPARISON:  None. FINDINGS: MRI HEAD FINDINGS Brain: Numerous T2 FLAIR hyperintense white matter lesions are present (greater than 20) involving juxta cortical white matter, periventricular white matter, corpus callosum (genu, body, splenium), left greater than right genu of internal capsule, left medial thalamus, left superior cerebellar peduncle and medial cerebellar white matter, right brachium pontis, and the left hemi pons. Periventricular lesions have an ovoid radial configuration relative to the ventricular system. After administration of intravenous contrast there is  faint enhancement of 2 lesions in left frontal periventricular and left parietal subcortical white matter (series 70, image 7 and 9) as well as avid enhancement of a lesion within the right superior temporal gyrus (series 70, image 5). No structural abnormality of the brain identified. No findings of stroke, hemorrhage, mass effect, extra-axial collection, hydrocephalus, or herniation. Vascular: Right postcentral gyrus developmental venous anomaly. Skull and upper cervical spine: Normal marrow signal. Sinuses/Orbits: Mild left maxillary sinus mucosal thickening. No additional abnormal signal of visible paranasal sinuses or the mastoid air cells. Orbits are unremarkable. Other: Negative. MRI CERVICAL SPINE FINDINGS Alignment: Straightening of cervical lordosis without listhesis. Vertebrae: No fracture, evidence of discitis, or bone lesion. No abnormal enhancement. Cord: No abnormal enhancement. Short segment T2 hyperintense cord lesions as follows: 1. Right lateral cord, C2-3 2. Dorsal column in right posterior horn, C4 3. Left central cord extending to left posterior column, C6 Posterior Fossa, vertebral arteries, paraspinal tissues: Negative. Disc levels: No significant disc displacement, foraminal stenosis, or canal stenosis. MRI THORACIC SPINE FINDINGS Alignment:  Physiologic. Vertebrae: No fracture, evidence of discitis, or bone lesion. No abnormal enhancement. Cord: Mild motion artifact of axial sequences. Normal signal and morphology. No abnormal enhancement Paraspinal and other soft tissues: Negative. Disc levels: Small right central disc protrusion with ventral thecal sac effacement. No additional level of disc displacement. No foraminal stenosis, canal stenosis, or neural impingement. MRI LUMBAR SPINE FINDINGS Segmentation:  Standard. Alignment:  Physiologic. Vertebrae: No fracture, evidence of discitis, or bone lesion. No abnormal enhancement. Conus medullaris: Extends to the L1-2 level and appears normal.  No abnormal enhancement. Paraspinal and other soft tissues: Negative. Disc levels: No significant disc displacement, foraminal stenosis, or canal stenosis. IMPRESSION: MRI head: Numerous white matter lesions (>20) and a pattern typical of demyelination and multiple sclerosis. Enhancement of 3 lesions in the right brain most pronounced in right superior temporal lobe. Findings satisfy revised McDonald criteria for dissemination in time and space. Differential considerations such as ADEM, NMO, Lyme disease, or vasculopathy are considered unlikely. MRI cervical spine: Three short segment nonenhancing spinal cord lesions in a pattern typical of multiple sclerosis as above. MRI thoracic spine: No spinal cord lesion identified. Mild motion artifact. Unremarkable thoracic spine MRI. MRI lumbar spine: Normal lumbar spine MRI. Electronically Signed   By: Kristine Garbe M.D.   On: 12/18/2018 17:36   Mr Cervical Spine W Wo Contrast  Result Date: 12/18/2018 CLINICAL DATA:  33 y/o F; generalized numbness that began on the right side Tuesday with progressive worsening, now involving the whole body. EXAM: MRI HEAD WITHOUT AND WITH CONTRAST MRI CERVICAL SPINE WITHOUT AND WITH CONTRAST MRI THORACIC WITHOUT AND WITH CONTRAST MRI LUMBAR SPINE WITHOUT AND WITH CONTRAST TECHNIQUE: Multiplanar, multiecho pulse sequences of the brain and surrounding structures, and cervical spine, to include the craniocervical junction and cervicothoracic junction, were obtained without and with intravenous contrast. Multiplanar and multiecho pulse sequences of the thoracic and lumbar spine  were obtained without and with intravenous contrast. CONTRAST:  7 cc Gadavist COMPARISON:  None. FINDINGS: MRI HEAD FINDINGS Brain: Numerous T2 FLAIR hyperintense white matter lesions are present (greater than 20) involving juxta cortical white matter, periventricular white matter, corpus callosum (genu, body, splenium), left greater than right genu of  internal capsule, left medial thalamus, left superior cerebellar peduncle and medial cerebellar white matter, right brachium pontis, and the left hemi pons. Periventricular lesions have an ovoid radial configuration relative to the ventricular system. After administration of intravenous contrast there is faint enhancement of 2 lesions in left frontal periventricular and left parietal subcortical white matter (series 70, image 7 and 9) as well as avid enhancement of a lesion within the right superior temporal gyrus (series 70, image 5). No structural abnormality of the brain identified. No findings of stroke, hemorrhage, mass effect, extra-axial collection, hydrocephalus, or herniation. Vascular: Right postcentral gyrus developmental venous anomaly. Skull and upper cervical spine: Normal marrow signal. Sinuses/Orbits: Mild left maxillary sinus mucosal thickening. No additional abnormal signal of visible paranasal sinuses or the mastoid air cells. Orbits are unremarkable. Other: Negative. MRI CERVICAL SPINE FINDINGS Alignment: Straightening of cervical lordosis without listhesis. Vertebrae: No fracture, evidence of discitis, or bone lesion. No abnormal enhancement. Cord: No abnormal enhancement. Short segment T2 hyperintense cord lesions as follows: 1. Right lateral cord, C2-3 2. Dorsal column in right posterior horn, C4 3. Left central cord extending to left posterior column, C6 Posterior Fossa, vertebral arteries, paraspinal tissues: Negative. Disc levels: No significant disc displacement, foraminal stenosis, or canal stenosis. MRI THORACIC SPINE FINDINGS Alignment:  Physiologic. Vertebrae: No fracture, evidence of discitis, or bone lesion. No abnormal enhancement. Cord: Mild motion artifact of axial sequences. Normal signal and morphology. No abnormal enhancement Paraspinal and other soft tissues: Negative. Disc levels: Small right central disc protrusion with ventral thecal sac effacement. No additional level of  disc displacement. No foraminal stenosis, canal stenosis, or neural impingement. MRI LUMBAR SPINE FINDINGS Segmentation:  Standard. Alignment:  Physiologic. Vertebrae: No fracture, evidence of discitis, or bone lesion. No abnormal enhancement. Conus medullaris: Extends to the L1-2 level and appears normal. No abnormal enhancement. Paraspinal and other soft tissues: Negative. Disc levels: No significant disc displacement, foraminal stenosis, or canal stenosis. IMPRESSION: MRI head: Numerous white matter lesions (>20) and a pattern typical of demyelination and multiple sclerosis. Enhancement of 3 lesions in the right brain most pronounced in right superior temporal lobe. Findings satisfy revised McDonald criteria for dissemination in time and space. Differential considerations such as ADEM, NMO, Lyme disease, or vasculopathy are considered unlikely. MRI cervical spine: Three short segment nonenhancing spinal cord lesions in a pattern typical of multiple sclerosis as above. MRI thoracic spine: No spinal cord lesion identified. Mild motion artifact. Unremarkable thoracic spine MRI. MRI lumbar spine: Normal lumbar spine MRI. Electronically Signed   By: Kristine Garbe M.D.   On: 12/18/2018 17:36   Mr Thoracic Spine W Wo Contrast  Result Date: 12/18/2018 CLINICAL DATA:  33 y/o F; generalized numbness that began on the right side Tuesday with progressive worsening, now involving the whole body. EXAM: MRI HEAD WITHOUT AND WITH CONTRAST MRI CERVICAL SPINE WITHOUT AND WITH CONTRAST MRI THORACIC WITHOUT AND WITH CONTRAST MRI LUMBAR SPINE WITHOUT AND WITH CONTRAST TECHNIQUE: Multiplanar, multiecho pulse sequences of the brain and surrounding structures, and cervical spine, to include the craniocervical junction and cervicothoracic junction, were obtained without and with intravenous contrast. Multiplanar and multiecho pulse sequences of the thoracic and lumbar spine were obtained  without and with intravenous  contrast. CONTRAST:  7 cc Gadavist COMPARISON:  None. FINDINGS: MRI HEAD FINDINGS Brain: Numerous T2 FLAIR hyperintense white matter lesions are present (greater than 20) involving juxta cortical white matter, periventricular white matter, corpus callosum (genu, body, splenium), left greater than right genu of internal capsule, left medial thalamus, left superior cerebellar peduncle and medial cerebellar white matter, right brachium pontis, and the left hemi pons. Periventricular lesions have an ovoid radial configuration relative to the ventricular system. After administration of intravenous contrast there is faint enhancement of 2 lesions in left frontal periventricular and left parietal subcortical white matter (series 70, image 7 and 9) as well as avid enhancement of a lesion within the right superior temporal gyrus (series 70, image 5). No structural abnormality of the brain identified. No findings of stroke, hemorrhage, mass effect, extra-axial collection, hydrocephalus, or herniation. Vascular: Right postcentral gyrus developmental venous anomaly. Skull and upper cervical spine: Normal marrow signal. Sinuses/Orbits: Mild left maxillary sinus mucosal thickening. No additional abnormal signal of visible paranasal sinuses or the mastoid air cells. Orbits are unremarkable. Other: Negative. MRI CERVICAL SPINE FINDINGS Alignment: Straightening of cervical lordosis without listhesis. Vertebrae: No fracture, evidence of discitis, or bone lesion. No abnormal enhancement. Cord: No abnormal enhancement. Short segment T2 hyperintense cord lesions as follows: 1. Right lateral cord, C2-3 2. Dorsal column in right posterior horn, C4 3. Left central cord extending to left posterior column, C6 Posterior Fossa, vertebral arteries, paraspinal tissues: Negative. Disc levels: No significant disc displacement, foraminal stenosis, or canal stenosis. MRI THORACIC SPINE FINDINGS Alignment:  Physiologic. Vertebrae: No fracture,  evidence of discitis, or bone lesion. No abnormal enhancement. Cord: Mild motion artifact of axial sequences. Normal signal and morphology. No abnormal enhancement Paraspinal and other soft tissues: Negative. Disc levels: Small right central disc protrusion with ventral thecal sac effacement. No additional level of disc displacement. No foraminal stenosis, canal stenosis, or neural impingement. MRI LUMBAR SPINE FINDINGS Segmentation:  Standard. Alignment:  Physiologic. Vertebrae: No fracture, evidence of discitis, or bone lesion. No abnormal enhancement. Conus medullaris: Extends to the L1-2 level and appears normal. No abnormal enhancement. Paraspinal and other soft tissues: Negative. Disc levels: No significant disc displacement, foraminal stenosis, or canal stenosis. IMPRESSION: MRI head: Numerous white matter lesions (>20) and a pattern typical of demyelination and multiple sclerosis. Enhancement of 3 lesions in the right brain most pronounced in right superior temporal lobe. Findings satisfy revised McDonald criteria for dissemination in time and space. Differential considerations such as ADEM, NMO, Lyme disease, or vasculopathy are considered unlikely. MRI cervical spine: Three short segment nonenhancing spinal cord lesions in a pattern typical of multiple sclerosis as above. MRI thoracic spine: No spinal cord lesion identified. Mild motion artifact. Unremarkable thoracic spine MRI. MRI lumbar spine: Normal lumbar spine MRI. Electronically Signed   By: Kristine Garbe M.D.   On: 12/18/2018 17:36   Mr Lumbar Spine W Wo Contrast  Result Date: 12/18/2018 CLINICAL DATA:  33 y/o F; generalized numbness that began on the right side Tuesday with progressive worsening, now involving the whole body. EXAM: MRI HEAD WITHOUT AND WITH CONTRAST MRI CERVICAL SPINE WITHOUT AND WITH CONTRAST MRI THORACIC WITHOUT AND WITH CONTRAST MRI LUMBAR SPINE WITHOUT AND WITH CONTRAST TECHNIQUE: Multiplanar, multiecho pulse  sequences of the brain and surrounding structures, and cervical spine, to include the craniocervical junction and cervicothoracic junction, were obtained without and with intravenous contrast. Multiplanar and multiecho pulse sequences of the thoracic and lumbar spine were obtained without  and with intravenous contrast. CONTRAST:  7 cc Gadavist COMPARISON:  None. FINDINGS: MRI HEAD FINDINGS Brain: Numerous T2 FLAIR hyperintense white matter lesions are present (greater than 20) involving juxta cortical white matter, periventricular white matter, corpus callosum (genu, body, splenium), left greater than right genu of internal capsule, left medial thalamus, left superior cerebellar peduncle and medial cerebellar white matter, right brachium pontis, and the left hemi pons. Periventricular lesions have an ovoid radial configuration relative to the ventricular system. After administration of intravenous contrast there is faint enhancement of 2 lesions in left frontal periventricular and left parietal subcortical white matter (series 70, image 7 and 9) as well as avid enhancement of a lesion within the right superior temporal gyrus (series 70, image 5). No structural abnormality of the brain identified. No findings of stroke, hemorrhage, mass effect, extra-axial collection, hydrocephalus, or herniation. Vascular: Right postcentral gyrus developmental venous anomaly. Skull and upper cervical spine: Normal marrow signal. Sinuses/Orbits: Mild left maxillary sinus mucosal thickening. No additional abnormal signal of visible paranasal sinuses or the mastoid air cells. Orbits are unremarkable. Other: Negative. MRI CERVICAL SPINE FINDINGS Alignment: Straightening of cervical lordosis without listhesis. Vertebrae: No fracture, evidence of discitis, or bone lesion. No abnormal enhancement. Cord: No abnormal enhancement. Short segment T2 hyperintense cord lesions as follows: 1. Right lateral cord, C2-3 2. Dorsal column in right  posterior horn, C4 3. Left central cord extending to left posterior column, C6 Posterior Fossa, vertebral arteries, paraspinal tissues: Negative. Disc levels: No significant disc displacement, foraminal stenosis, or canal stenosis. MRI THORACIC SPINE FINDINGS Alignment:  Physiologic. Vertebrae: No fracture, evidence of discitis, or bone lesion. No abnormal enhancement. Cord: Mild motion artifact of axial sequences. Normal signal and morphology. No abnormal enhancement Paraspinal and other soft tissues: Negative. Disc levels: Small right central disc protrusion with ventral thecal sac effacement. No additional level of disc displacement. No foraminal stenosis, canal stenosis, or neural impingement. MRI LUMBAR SPINE FINDINGS Segmentation:  Standard. Alignment:  Physiologic. Vertebrae: No fracture, evidence of discitis, or bone lesion. No abnormal enhancement. Conus medullaris: Extends to the L1-2 level and appears normal. No abnormal enhancement. Paraspinal and other soft tissues: Negative. Disc levels: No significant disc displacement, foraminal stenosis, or canal stenosis. IMPRESSION: MRI head: Numerous white matter lesions (>20) and a pattern typical of demyelination and multiple sclerosis. Enhancement of 3 lesions in the right brain most pronounced in right superior temporal lobe. Findings satisfy revised McDonald criteria for dissemination in time and space. Differential considerations such as ADEM, NMO, Lyme disease, or vasculopathy are considered unlikely. MRI cervical spine: Three short segment nonenhancing spinal cord lesions in a pattern typical of multiple sclerosis as above. MRI thoracic spine: No spinal cord lesion identified. Mild motion artifact. Unremarkable thoracic spine MRI. MRI lumbar spine: Normal lumbar spine MRI. Electronically Signed   By: Kristine Garbe M.D.   On: 12/18/2018 17:36   ASSESSMENT AND PLAN  33 y.o.femalewith medical history significant ofthe no past medical history  except former tobacco abuse history of varicella presents with numbness and paresthesias over the right arm and from waist down.  She has multiple enhancing and nonenhancing demyelinating lesions in the brain and spinal cord consistent with multiple sclerosis.   Multiple sclerosis -1 g Solu-Medrol for 5 days ( dose 2 due today) -Start PPI -We will defer decision whether to perform LP to outpatient neurologist -Vitamin D level: pending -Need outpatient follow-up with Dr. Felecia Shelling to start disease modifying therapy.  Laurey Morale, MSN, NP-C [facility] [phone]  Attending neurologist's note to follow    12/19/2018, 8:24 AM    NEUROHOSPITALIST ADDENDUM Performed a face to face diagnostic evaluation.   I have reviewed the contents of history and physical exam as documented by PA/ARNP/Resident and agree with above documentation.  I have discussed and formulated the above plan as documented. Edits to the note have been made as needed.  Patient still has some ataxia on examination as well as her sensory deficits.  I think she will benefit from 5 days of steroids instead of 3 and patient is willing to stay.  She is scheduled to see Dr. Felecia Shelling on February 20.  We will also order serum NMO antibodies, although from the imaging it is unlikely NMO.   Neurology will sign off.  Thanks for the consult.   Karena Addison  MD [facility] [phone]   If 7pm to 7am, please call on call as listed on AMION.

## 2018-12-19 NOTE — Evaluation (Signed)
Physical Therapy Evaluation Patient Details Name: Chloe Pittman MRN: [id number] DOB: 11/12/1985 Today's Date: 12/19/2018   History of Present Illness  Patient is a 33 year old female sent to ED by her PCP for reports of numbness and parathesias that began 3 days ago and were progressive. Patient testing revealed spinal cord lesions consistent with MS.   Clinical Impression  Patient received in bed, pleasant, agrees to PT evaluation. Patient reports continued paresthesias in B LEs from waist down and right UE. Reports tingling sensation. Independent with bed mobility and transfers. Patient ambulated 300 feet without AD, normal pace, increased foot clearance on right (like a kick) with ambulation. Mild balance deficits in standing noted. Patient will benefit from skilled PT follow up while here to ensure her safe return home. All other PT needs can be addressed in outpatient setting.           Follow Up Recommendations Outpatient PT    Equipment Recommendations  None recommended by PT    Recommendations for Other Services       Precautions / Restrictions Precautions Precautions: Fall Restrictions Weight Bearing Restrictions: No      Mobility  Bed Mobility Overal bed mobility: Independent                Transfers Overall transfer level: Independent                  Ambulation/Gait   Gait Distance (Feet): 300 Feet Assistive device: None Gait Pattern/deviations: WFL(Within Functional Limits) Gait velocity: normal   General Gait Details: demonstrated a foot kick on right when walking, patient reports she is trying to make sure she picks up the right foot  Stairs            Wheelchair Mobility    Modified Rankin (Stroke Patients Only)       Balance Overall balance assessment: Mild deficits observed, not formally tested(with perturbations standing with feet together patient able to maintain balance but feels less steady when pushed to her right side.   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, fully independent prior     Hand Dominance   Dominant Hand: Right    Extremity/Trunk Assessment   Upper Extremity Assessment Upper Extremity Assessment: Defer to OT evaluation    Lower Extremity Assessment Lower Extremity Assessment: RLE deficits/detail RLE Deficits / Details: mild weakness noted on Right UE & LE RLE Sensation: decreased proprioception RLE Coordination: decreased fine motor;decreased gross motor    Cervical / Trunk Assessment Cervical / Trunk Assessment: Normal  Communication   Communication: No difficulties  Cognition Arousal/Alertness: Awake/alert Behavior During Therapy: WFL for tasks assessed/performed Overall Cognitive Status: Within Functional Limits for tasks assessed  General Comments      Exercises Other Exercises Other Exercises: standing perturbations in all directions   Assessment/Plan    PT Assessment Patient needs continued PT services  PT Problem List Decreased strength;Decreased balance;Decreased coordination;Impaired sensation       PT Treatment Interventions Functional mobility training;Balance training;Patient/family education;Neuromuscular re-education;Therapeutic exercise    PT Goals (Current goals can be found in the Care Plan section)  Acute Rehab PT Goals Patient Stated Goal: to have symptoms go away, return home PT Goal Formulation: With patient Time For Goal Achievement:  01/02/19 Potential to Achieve Goals: Good    Frequency Min 3X/week   Barriers to discharge        Co-evaluation               AM-PAC PT "6 Clicks" Mobility  Outcome Measure Help needed turning from your back to your side while in a flat bed without using bedrails?: None Help needed moving from lying on your back to sitting on the side of a flat bed without using bedrails?: None Help needed moving to and from a bed to a chair (including a wheelchair)?: None Help needed standing up from a chair using your arms (e.g., wheelchair or bedside chair)?: None Help needed to walk in hospital room?: None Help needed climbing 3-5 steps with a railing? : None 6 Click Score: 24    End of Session   Activity Tolerance: Patient tolerated treatment well Patient left: in bed;with call bell/phone within reach Nurse Communication: Mobility status PT Visit Diagnosis: Other abnormalities of gait and mobility (R26.89);Muscle weakness (generalized) (M62.81);Other symptoms and signs involving the nervous system (R29.898)    Time: 5597-4163 PT Time Calculation (min) (ACUTE ONLY): 15 min   Charges:   PT Evaluation $PT Eval Low Complexity: 1 Low PT Treatments $Gait Training: 8-22 mins        Taos Tapp, PT, [facility] 12/19/18,10:40 AM

## 2018-12-19 NOTE — Telephone Encounter (Signed)
Called and spoke with pt. Dr. Felecia Shelling received request to fit pt in for new consult from ED. Scheduled new patient appt for 12/25/18 at 10am, check in 930am. Pt verbalized understanding and appreciation for call. She will bring updated insurance cards and med list with her to appt.

## 2018-12-19 NOTE — Progress Notes (Signed)
[name], female   DOB: November 28, 1985, 33 y.o.   MRN: [id number]  PROGRESS NOTE    Azarya Oconnell  JQZ:[id number] DOB: 11/06/1985 DOA: 12/18/2018 PCP: Dineen Kid, MD   Brief Narrative:  33 year old female with history of varicella, external hemorrhoids status post ovariectomy presented with numbness and weakness.  MRI imaging showed new onset multiple sclerosis.  Neurology was consulted.  She was started on IV Solu-Medrol.   Assessment & Plan:   Active Problems:   Multiple sclerosis ([place])   New onset of multiple sclerosis with numbness and paresthesias -Continue 1 g IV Solu-Medrol for 3 to 5 days.  Neurology following.  Continue oral PPI. -PT eval   DVT prophylaxis: Lovenox Code Status: Full Family Communication: None at bedside Disposition Plan: Home once cleared by neurology  Consultants: Neurology  Procedures: None Antimicrobials: None   Subjective: Patient seen and examined at bedside.  Denies any improvement of her numbness.  No visual problems or worsening weakness of extremities.  No overnight fever or vomiting.  Objective: Vitals:   12/18/18 1813 12/18/18 2043 12/19/18 0439 12/19/18 0500  BP: 123/69 101/64 (!) 104/54   Pulse: 84 78 64   Resp: 18 16 16    Temp:  98.4 F (36.9 C) 97.9 F (36.6 C)   TempSrc:  Oral Oral   SpO2: 97% 100% 99%   Weight:    74.6 kg    Intake/Output Summary (Last 24 hours) at 12/19/2018 6226 Last data filed at 12/19/2018 0500 Gross per 24 hour  Intake 50 ml  Output -  Net 50 ml   Filed Weights   12/19/18 0500  Weight: 74.6 kg    Examination:  General exam: Appears calm and comfortable  Respiratory system: Bilateral decreased breath sounds at bases Cardiovascular system: S1 & S2 heard, Rate controlled Gastrointestinal system: Abdomen is nondistended, soft and nontender. Normal bowel sounds heard. Extremities: No cyanosis, edema   Data Reviewed: I have personally reviewed following labs and imaging  studies  CBC: Recent Labs  Lab 12/18/18 1210 12/19/18 0359  WBC 8.0 3.8*  HGB 13.2 12.7  HCT 42.3 39.5  MCV 95.3 92.9  PLT 242 333   Basic Metabolic Panel: Recent Labs  Lab 12/18/18 1210 12/18/18 1924 12/19/18 0359  NA 138  --  139  K 4.0  --  4.1  CL 105  --  110  CO2 25  --  23  GLUCOSE 85  --  149*  BUN 14  --  17  CREATININE 0.89  --  0.93  CALCIUM 9.3  --  9.0  MG 2.2  --   --   PHOS  --  3.0  --    GFR: CrCl cannot be calculated (Unknown ideal weight.). Liver Function Tests: Recent Labs  Lab 12/19/18 0359  AST 16  ALT 15  ALKPHOS 35*  BILITOT 0.8  PROT 7.2  ALBUMIN 4.1   No results for input(s): LIPASE, AMYLASE in the last 168 hours. No results for input(s): AMMONIA in the last 168 hours. Coagulation Profile: No results for input(s): INR, PROTIME in the last 168 hours. Cardiac Enzymes: No results for input(s): CKTOTAL, CKMB, CKMBINDEX, TROPONINI in the last 168 hours. BNP (last 3 results) No results for input(s): PROBNP in the last 8760 hours. HbA1C: Recent Labs    12/19/18 0359  HGBA1C 5.4   CBG: Recent Labs  Lab 12/19/18 0744  GLUCAP 107*   Lipid Profile: No results for input(s): CHOL, HDL, LDLCALC, TRIG, CHOLHDL,  LDLDIRECT in the last 72 hours. Thyroid Function Tests: Recent Labs    12/18/18 1230  TSH 2.377   Anemia Panel: No results for input(s): VITAMINB12, FOLATE, FERRITIN, TIBC, IRON, RETICCTPCT in the last 72 hours. Sepsis Labs: No results for input(s): PROCALCITON, LATICACIDVEN in the last 168 hours.  No results found for this or any previous visit (from the past 240 hour(s)).       Radiology Studies: Mr Jeri Cos Wo Contrast  Result Date: 12/18/2018 CLINICAL DATA:  33 y/o F; generalized numbness that began on the right side Tuesday with progressive worsening, now involving the whole body. EXAM: MRI HEAD WITHOUT AND WITH CONTRAST MRI CERVICAL SPINE WITHOUT AND WITH CONTRAST MRI THORACIC WITHOUT AND WITH CONTRAST MRI  LUMBAR SPINE WITHOUT AND WITH CONTRAST TECHNIQUE: Multiplanar, multiecho pulse sequences of the brain and surrounding structures, and cervical spine, to include the craniocervical junction and cervicothoracic junction, were obtained without and with intravenous contrast. Multiplanar and multiecho pulse sequences of the thoracic and lumbar spine were obtained without and with intravenous contrast. CONTRAST:  7 cc Gadavist COMPARISON:  None. FINDINGS: MRI HEAD FINDINGS Brain: Numerous T2 FLAIR hyperintense white matter lesions are present (greater than 20) involving juxta cortical white matter, periventricular white matter, corpus callosum (genu, body, splenium), left greater than right genu of internal capsule, left medial thalamus, left superior cerebellar peduncle and medial cerebellar white matter, right brachium pontis, and the left hemi pons. Periventricular lesions have an ovoid radial configuration relative to the ventricular system. After administration of intravenous contrast there is faint enhancement of 2 lesions in left frontal periventricular and left parietal subcortical white matter (series 70, image 7 and 9) as well as avid enhancement of a lesion within the right superior temporal gyrus (series 70, image 5). No structural abnormality of the brain identified. No findings of stroke, hemorrhage, mass effect, extra-axial collection, hydrocephalus, or herniation. Vascular: Right postcentral gyrus developmental venous anomaly. Skull and upper cervical spine: Normal marrow signal. Sinuses/Orbits: Mild left maxillary sinus mucosal thickening. No additional abnormal signal of visible paranasal sinuses or the mastoid air cells. Orbits are unremarkable. Other: Negative. MRI CERVICAL SPINE FINDINGS Alignment: Straightening of cervical lordosis without listhesis. Vertebrae: No fracture, evidence of discitis, or bone lesion. No abnormal enhancement. Cord: No abnormal enhancement. Short segment T2 hyperintense cord  lesions as follows: 1. Right lateral cord, C2-3 2. Dorsal column in right posterior horn, C4 3. Left central cord extending to left posterior column, C6 Posterior Fossa, vertebral arteries, paraspinal tissues: Negative. Disc levels: No significant disc displacement, foraminal stenosis, or canal stenosis. MRI THORACIC SPINE FINDINGS Alignment:  Physiologic. Vertebrae: No fracture, evidence of discitis, or bone lesion. No abnormal enhancement. Cord: Mild motion artifact of axial sequences. Normal signal and morphology. No abnormal enhancement Paraspinal and other soft tissues: Negative. Disc levels: Small right central disc protrusion with ventral thecal sac effacement. No additional level of disc displacement. No foraminal stenosis, canal stenosis, or neural impingement. MRI LUMBAR SPINE FINDINGS Segmentation:  Standard. Alignment:  Physiologic. Vertebrae: No fracture, evidence of discitis, or bone lesion. No abnormal enhancement. Conus medullaris: Extends to the L1-2 level and appears normal. No abnormal enhancement. Paraspinal and other soft tissues: Negative. Disc levels: No significant disc displacement, foraminal stenosis, or canal stenosis. IMPRESSION: MRI head: Numerous white matter lesions (>20) and a pattern typical of demyelination and multiple sclerosis. Enhancement of 3 lesions in the right brain most pronounced in right superior temporal lobe. Findings satisfy revised McDonald criteria for dissemination in time and  space. Differential considerations such as ADEM, NMO, Lyme disease, or vasculopathy are considered unlikely. MRI cervical spine: Three short segment nonenhancing spinal cord lesions in a pattern typical of multiple sclerosis as above. MRI thoracic spine: No spinal cord lesion identified. Mild motion artifact. Unremarkable thoracic spine MRI. MRI lumbar spine: Normal lumbar spine MRI. Electronically Signed   By: Kristine Garbe M.D.   On: 12/18/2018 17:36   Mr Cervical Spine W Wo  Contrast  Result Date: 12/18/2018 CLINICAL DATA:  33 y/o F; generalized numbness that began on the right side Tuesday with progressive worsening, now involving the whole body. EXAM: MRI HEAD WITHOUT AND WITH CONTRAST MRI CERVICAL SPINE WITHOUT AND WITH CONTRAST MRI THORACIC WITHOUT AND WITH CONTRAST MRI LUMBAR SPINE WITHOUT AND WITH CONTRAST TECHNIQUE: Multiplanar, multiecho pulse sequences of the brain and surrounding structures, and cervical spine, to include the craniocervical junction and cervicothoracic junction, were obtained without and with intravenous contrast. Multiplanar and multiecho pulse sequences of the thoracic and lumbar spine were obtained without and with intravenous contrast. CONTRAST:  7 cc Gadavist COMPARISON:  None. FINDINGS: MRI HEAD FINDINGS Brain: Numerous T2 FLAIR hyperintense white matter lesions are present (greater than 20) involving juxta cortical white matter, periventricular white matter, corpus callosum (genu, body, splenium), left greater than right genu of internal capsule, left medial thalamus, left superior cerebellar peduncle and medial cerebellar white matter, right brachium pontis, and the left hemi pons. Periventricular lesions have an ovoid radial configuration relative to the ventricular system. After administration of intravenous contrast there is faint enhancement of 2 lesions in left frontal periventricular and left parietal subcortical white matter (series 70, image 7 and 9) as well as avid enhancement of a lesion within the right superior temporal gyrus (series 70, image 5). No structural abnormality of the brain identified. No findings of stroke, hemorrhage, mass effect, extra-axial collection, hydrocephalus, or herniation. Vascular: Right postcentral gyrus developmental venous anomaly. Skull and upper cervical spine: Normal marrow signal. Sinuses/Orbits: Mild left maxillary sinus mucosal thickening. No additional abnormal signal of visible paranasal sinuses or the  mastoid air cells. Orbits are unremarkable. Other: Negative. MRI CERVICAL SPINE FINDINGS Alignment: Straightening of cervical lordosis without listhesis. Vertebrae: No fracture, evidence of discitis, or bone lesion. No abnormal enhancement. Cord: No abnormal enhancement. Short segment T2 hyperintense cord lesions as follows: 1. Right lateral cord, C2-3 2. Dorsal column in right posterior horn, C4 3. Left central cord extending to left posterior column, C6 Posterior Fossa, vertebral arteries, paraspinal tissues: Negative. Disc levels: No significant disc displacement, foraminal stenosis, or canal stenosis. MRI THORACIC SPINE FINDINGS Alignment:  Physiologic. Vertebrae: No fracture, evidence of discitis, or bone lesion. No abnormal enhancement. Cord: Mild motion artifact of axial sequences. Normal signal and morphology. No abnormal enhancement Paraspinal and other soft tissues: Negative. Disc levels: Small right central disc protrusion with ventral thecal sac effacement. No additional level of disc displacement. No foraminal stenosis, canal stenosis, or neural impingement. MRI LUMBAR SPINE FINDINGS Segmentation:  Standard. Alignment:  Physiologic. Vertebrae: No fracture, evidence of discitis, or bone lesion. No abnormal enhancement. Conus medullaris: Extends to the L1-2 level and appears normal. No abnormal enhancement. Paraspinal and other soft tissues: Negative. Disc levels: No significant disc displacement, foraminal stenosis, or canal stenosis. IMPRESSION: MRI head: Numerous white matter lesions (>20) and a pattern typical of demyelination and multiple sclerosis. Enhancement of 3 lesions in the right brain most pronounced in right superior temporal lobe. Findings satisfy revised McDonald criteria for dissemination in time and space. Differential  considerations such as ADEM, NMO, Lyme disease, or vasculopathy are considered unlikely. MRI cervical spine: Three short segment nonenhancing spinal cord lesions in a  pattern typical of multiple sclerosis as above. MRI thoracic spine: No spinal cord lesion identified. Mild motion artifact. Unremarkable thoracic spine MRI. MRI lumbar spine: Normal lumbar spine MRI. Electronically Signed   By: Kristine Garbe M.D.   On: 12/18/2018 17:36   Mr Thoracic Spine W Wo Contrast  Result Date: 12/18/2018 CLINICAL DATA:  33 y/o F; generalized numbness that began on the right side Tuesday with progressive worsening, now involving the whole body. EXAM: MRI HEAD WITHOUT AND WITH CONTRAST MRI CERVICAL SPINE WITHOUT AND WITH CONTRAST MRI THORACIC WITHOUT AND WITH CONTRAST MRI LUMBAR SPINE WITHOUT AND WITH CONTRAST TECHNIQUE: Multiplanar, multiecho pulse sequences of the brain and surrounding structures, and cervical spine, to include the craniocervical junction and cervicothoracic junction, were obtained without and with intravenous contrast. Multiplanar and multiecho pulse sequences of the thoracic and lumbar spine were obtained without and with intravenous contrast. CONTRAST:  7 cc Gadavist COMPARISON:  None. FINDINGS: MRI HEAD FINDINGS Brain: Numerous T2 FLAIR hyperintense white matter lesions are present (greater than 20) involving juxta cortical white matter, periventricular white matter, corpus callosum (genu, body, splenium), left greater than right genu of internal capsule, left medial thalamus, left superior cerebellar peduncle and medial cerebellar white matter, right brachium pontis, and the left hemi pons. Periventricular lesions have an ovoid radial configuration relative to the ventricular system. After administration of intravenous contrast there is faint enhancement of 2 lesions in left frontal periventricular and left parietal subcortical white matter (series 70, image 7 and 9) as well as avid enhancement of a lesion within the right superior temporal gyrus (series 70, image 5). No structural abnormality of the brain identified. No findings of stroke, hemorrhage, mass  effect, extra-axial collection, hydrocephalus, or herniation. Vascular: Right postcentral gyrus developmental venous anomaly. Skull and upper cervical spine: Normal marrow signal. Sinuses/Orbits: Mild left maxillary sinus mucosal thickening. No additional abnormal signal of visible paranasal sinuses or the mastoid air cells. Orbits are unremarkable. Other: Negative. MRI CERVICAL SPINE FINDINGS Alignment: Straightening of cervical lordosis without listhesis. Vertebrae: No fracture, evidence of discitis, or bone lesion. No abnormal enhancement. Cord: No abnormal enhancement. Short segment T2 hyperintense cord lesions as follows: 1. Right lateral cord, C2-3 2. Dorsal column in right posterior horn, C4 3. Left central cord extending to left posterior column, C6 Posterior Fossa, vertebral arteries, paraspinal tissues: Negative. Disc levels: No significant disc displacement, foraminal stenosis, or canal stenosis. MRI THORACIC SPINE FINDINGS Alignment:  Physiologic. Vertebrae: No fracture, evidence of discitis, or bone lesion. No abnormal enhancement. Cord: Mild motion artifact of axial sequences. Normal signal and morphology. No abnormal enhancement Paraspinal and other soft tissues: Negative. Disc levels: Small right central disc protrusion with ventral thecal sac effacement. No additional level of disc displacement. No foraminal stenosis, canal stenosis, or neural impingement. MRI LUMBAR SPINE FINDINGS Segmentation:  Standard. Alignment:  Physiologic. Vertebrae: No fracture, evidence of discitis, or bone lesion. No abnormal enhancement. Conus medullaris: Extends to the L1-2 level and appears normal. No abnormal enhancement. Paraspinal and other soft tissues: Negative. Disc levels: No significant disc displacement, foraminal stenosis, or canal stenosis. IMPRESSION: MRI head: Numerous white matter lesions (>20) and a pattern typical of demyelination and multiple sclerosis. Enhancement of 3 lesions in the right brain most  pronounced in right superior temporal lobe. Findings satisfy revised McDonald criteria for dissemination in time and space. Differential considerations  such as ADEM, NMO, Lyme disease, or vasculopathy are considered unlikely. MRI cervical spine: Three short segment nonenhancing spinal cord lesions in a pattern typical of multiple sclerosis as above. MRI thoracic spine: No spinal cord lesion identified. Mild motion artifact. Unremarkable thoracic spine MRI. MRI lumbar spine: Normal lumbar spine MRI. Electronically Signed   By: Kristine Garbe M.D.   On: 12/18/2018 17:36   Mr Lumbar Spine W Wo Contrast  Result Date: 12/18/2018 CLINICAL DATA:  33 y/o F; generalized numbness that began on the right side Tuesday with progressive worsening, now involving the whole body. EXAM: MRI HEAD WITHOUT AND WITH CONTRAST MRI CERVICAL SPINE WITHOUT AND WITH CONTRAST MRI THORACIC WITHOUT AND WITH CONTRAST MRI LUMBAR SPINE WITHOUT AND WITH CONTRAST TECHNIQUE: Multiplanar, multiecho pulse sequences of the brain and surrounding structures, and cervical spine, to include the craniocervical junction and cervicothoracic junction, were obtained without and with intravenous contrast. Multiplanar and multiecho pulse sequences of the thoracic and lumbar spine were obtained without and with intravenous contrast. CONTRAST:  7 cc Gadavist COMPARISON:  None. FINDINGS: MRI HEAD FINDINGS Brain: Numerous T2 FLAIR hyperintense white matter lesions are present (greater than 20) involving juxta cortical white matter, periventricular white matter, corpus callosum (genu, body, splenium), left greater than right genu of internal capsule, left medial thalamus, left superior cerebellar peduncle and medial cerebellar white matter, right brachium pontis, and the left hemi pons. Periventricular lesions have an ovoid radial configuration relative to the ventricular system. After administration of intravenous contrast there is faint enhancement of 2  lesions in left frontal periventricular and left parietal subcortical white matter (series 70, image 7 and 9) as well as avid enhancement of a lesion within the right superior temporal gyrus (series 70, image 5). No structural abnormality of the brain identified. No findings of stroke, hemorrhage, mass effect, extra-axial collection, hydrocephalus, or herniation. Vascular: Right postcentral gyrus developmental venous anomaly. Skull and upper cervical spine: Normal marrow signal. Sinuses/Orbits: Mild left maxillary sinus mucosal thickening. No additional abnormal signal of visible paranasal sinuses or the mastoid air cells. Orbits are unremarkable. Other: Negative. MRI CERVICAL SPINE FINDINGS Alignment: Straightening of cervical lordosis without listhesis. Vertebrae: No fracture, evidence of discitis, or bone lesion. No abnormal enhancement. Cord: No abnormal enhancement. Short segment T2 hyperintense cord lesions as follows: 1. Right lateral cord, C2-3 2. Dorsal column in right posterior horn, C4 3. Left central cord extending to left posterior column, C6 Posterior Fossa, vertebral arteries, paraspinal tissues: Negative. Disc levels: No significant disc displacement, foraminal stenosis, or canal stenosis. MRI THORACIC SPINE FINDINGS Alignment:  Physiologic. Vertebrae: No fracture, evidence of discitis, or bone lesion. No abnormal enhancement. Cord: Mild motion artifact of axial sequences. Normal signal and morphology. No abnormal enhancement Paraspinal and other soft tissues: Negative. Disc levels: Small right central disc protrusion with ventral thecal sac effacement. No additional level of disc displacement. No foraminal stenosis, canal stenosis, or neural impingement. MRI LUMBAR SPINE FINDINGS Segmentation:  Standard. Alignment:  Physiologic. Vertebrae: No fracture, evidence of discitis, or bone lesion. No abnormal enhancement. Conus medullaris: Extends to the L1-2 level and appears normal. No abnormal  enhancement. Paraspinal and other soft tissues: Negative. Disc levels: No significant disc displacement, foraminal stenosis, or canal stenosis. IMPRESSION: MRI head: Numerous white matter lesions (>20) and a pattern typical of demyelination and multiple sclerosis. Enhancement of 3 lesions in the right brain most pronounced in right superior temporal lobe. Findings satisfy revised McDonald criteria for dissemination in time and space. Differential considerations such as  ADEM, NMO, Lyme disease, or vasculopathy are considered unlikely. MRI cervical spine: Three short segment nonenhancing spinal cord lesions in a pattern typical of multiple sclerosis as above. MRI thoracic spine: No spinal cord lesion identified. Mild motion artifact. Unremarkable thoracic spine MRI. MRI lumbar spine: Normal lumbar spine MRI. Electronically Signed   By: Kristine Garbe M.D.   On: 12/18/2018 17:36        Scheduled Meds: . enoxaparin (LOVENOX) injection  40 mg Subcutaneous Q24H  . pantoprazole  40 mg Oral Daily   Continuous Infusions: . methylPREDNISolone (SOLU-MEDROL) injection       LOS: 1 day        Aline August, MD [facility] Pager [phone]  If 7PM-7AM, please contact night-coverage [email or web address] Password [facility] 12/19/2018, 9:28 AM

## 2018-12-20 LAB — GLUCOSE, CAPILLARY: Glucose-Capillary: 150 mg/dL — ABNORMAL HIGH (ref 70–99)

## 2018-12-20 MED ORDER — MELATONIN 3 MG PO TABS
3.0000 mg | ORAL_TABLET | Freq: Once | ORAL | Status: AC
  Administered 2018-12-21: 3 mg via ORAL
  Filled 2018-12-20: qty 1

## 2018-12-20 MED ORDER — NON FORMULARY: 2.0000 mg | Freq: Once | Status: DC

## 2018-12-20 NOTE — Progress Notes (Signed)
[name], female   DOB: [birth year], 33 y.o.   MRN: [id number]  PROGRESS NOTE    Chloe Pittman  XEN:[id number] DOB: 06/12/1986 DOA: 12/18/2018 PCP: Dineen Kid, MD   Brief Narrative:  33 year old female with history of varicella, external hemorrhoids status post ovariectomy presented with numbness and weakness.  MRI imaging showed new onset multiple sclerosis.  Neurology was consulted.  She was started on IV Solu-Medrol.   Assessment & Plan:   Active Problems:   Multiple sclerosis ([place])   New onset of multiple sclerosis with numbness and paresthesias -Continue 1 g IV Solu-Medrol for 5 days, today is day #3.  Neurology following.  Continue oral PPI. -PT and OT recommend outpatient PT/OT  DVT prophylaxis: Lovenox Code Status: Full Family Communication: None at bedside Disposition Plan: Home once cleared by neurology  Consultants: Neurology  Procedures: None Antimicrobials: None   Subjective: Patient seen and examined at bedside.  Denies any improvement of her numbness.  No weakness, visual symptoms reported.  No overnight fever, nausea or vomiting. Objective: Vitals:   12/19/18 0500 12/19/18 1549 12/19/18 2124 12/20/18 0426  BP:  106/67 119/75 (!) 99/52  Pulse:  78 73 67  Resp:   16 18  Temp:  98.1 F (36.7 C) 98.2 F (36.8 C) 98 F (36.7 C)  TempSrc:  Oral Oral Oral  SpO2:  100% 98% 96%  Weight: 74.6 kg      No intake or output data in the 24 hours ending 12/20/18 1018 Filed Weights   12/19/18 0500  Weight: 74.6 kg    Examination:  General exam: No acute distress.   Respiratory system: Breathing comfortably.  No tachypnea  Data Reviewed: I have personally reviewed following labs and imaging studies  CBC: Recent Labs  Lab 12/18/18 1210 12/19/18 0359  WBC 8.0 3.8*  HGB 13.2 12.7  HCT 42.3 39.5  MCV 95.3 92.9  PLT 242 103   Basic Metabolic Panel: Recent Labs  Lab 12/18/18 1210 12/18/18 1924 12/19/18 0359  NA 138  --  139  K 4.0   --  4.1  CL 105  --  110  CO2 25  --  23  GLUCOSE 85  --  149*  BUN 14  --  17  CREATININE 0.89  --  0.93  CALCIUM 9.3  --  9.0  MG 2.2  --   --   PHOS  --  3.0  --    GFR: CrCl cannot be calculated (Unknown ideal weight.). Liver Function Tests: Recent Labs  Lab 12/19/18 0359  AST 16  ALT 15  ALKPHOS 35*  BILITOT 0.8  PROT 7.2  ALBUMIN 4.1   No results for input(s): LIPASE, AMYLASE in the last 168 hours. No results for input(s): AMMONIA in the last 168 hours. Coagulation Profile: No results for input(s): INR, PROTIME in the last 168 hours. Cardiac Enzymes: No results for input(s): CKTOTAL, CKMB, CKMBINDEX, TROPONINI in the last 168 hours. BNP (last 3 results) No results for input(s): PROBNP in the last 8760 hours. HbA1C: Recent Labs    12/19/18 0359  HGBA1C 5.4   CBG: Recent Labs  Lab 12/19/18 0744 12/20/18 0757  GLUCAP 107* 150*   Lipid Profile: No results for input(s): CHOL, HDL, LDLCALC, TRIG, CHOLHDL, LDLDIRECT in the last 72 hours. Thyroid Function Tests: Recent Labs    12/18/18 1230  TSH 2.377   Anemia Panel: No results for input(s): VITAMINB12, FOLATE, FERRITIN, TIBC, IRON, RETICCTPCT in the last 72 hours.  Sepsis Labs: No results for input(s): PROCALCITON, LATICACIDVEN in the last 168 hours.  No results found for this or any previous visit (from the past 240 hour(s)).       Radiology Studies: Mr Jeri Cos Wo Contrast  Result Date: 12/18/2018 CLINICAL DATA:  33 y/o F; generalized numbness that began on the right side Tuesday with progressive worsening, now involving the whole body. EXAM: MRI HEAD WITHOUT AND WITH CONTRAST MRI CERVICAL SPINE WITHOUT AND WITH CONTRAST MRI THORACIC WITHOUT AND WITH CONTRAST MRI LUMBAR SPINE WITHOUT AND WITH CONTRAST TECHNIQUE: Multiplanar, multiecho pulse sequences of the brain and surrounding structures, and cervical spine, to include the craniocervical junction and cervicothoracic junction, were obtained without  and with intravenous contrast. Multiplanar and multiecho pulse sequences of the thoracic and lumbar spine were obtained without and with intravenous contrast. CONTRAST:  7 cc Gadavist COMPARISON:  None. FINDINGS: MRI HEAD FINDINGS Brain: Numerous T2 FLAIR hyperintense white matter lesions are present (greater than 20) involving juxta cortical white matter, periventricular white matter, corpus callosum (genu, body, splenium), left greater than right genu of internal capsule, left medial thalamus, left superior cerebellar peduncle and medial cerebellar white matter, right brachium pontis, and the left hemi pons. Periventricular lesions have an ovoid radial configuration relative to the ventricular system. After administration of intravenous contrast there is faint enhancement of 2 lesions in left frontal periventricular and left parietal subcortical white matter (series 70, image 7 and 9) as well as avid enhancement of a lesion within the right superior temporal gyrus (series 70, image 5). No structural abnormality of the brain identified. No findings of stroke, hemorrhage, mass effect, extra-axial collection, hydrocephalus, or herniation. Vascular: Right postcentral gyrus developmental venous anomaly. Skull and upper cervical spine: Normal marrow signal. Sinuses/Orbits: Mild left maxillary sinus mucosal thickening. No additional abnormal signal of visible paranasal sinuses or the mastoid air cells. Orbits are unremarkable. Other: Negative. MRI CERVICAL SPINE FINDINGS Alignment: Straightening of cervical lordosis without listhesis. Vertebrae: No fracture, evidence of discitis, or bone lesion. No abnormal enhancement. Cord: No abnormal enhancement. Short segment T2 hyperintense cord lesions as follows: 1. Right lateral cord, C2-3 2. Dorsal column in right posterior horn, C4 3. Left central cord extending to left posterior column, C6 Posterior Fossa, vertebral arteries, paraspinal tissues: Negative. Disc levels: No  significant disc displacement, foraminal stenosis, or canal stenosis. MRI THORACIC SPINE FINDINGS Alignment:  Physiologic. Vertebrae: No fracture, evidence of discitis, or bone lesion. No abnormal enhancement. Cord: Mild motion artifact of axial sequences. Normal signal and morphology. No abnormal enhancement Paraspinal and other soft tissues: Negative. Disc levels: Small right central disc protrusion with ventral thecal sac effacement. No additional level of disc displacement. No foraminal stenosis, canal stenosis, or neural impingement. MRI LUMBAR SPINE FINDINGS Segmentation:  Standard. Alignment:  Physiologic. Vertebrae: No fracture, evidence of discitis, or bone lesion. No abnormal enhancement. Conus medullaris: Extends to the L1-2 level and appears normal. No abnormal enhancement. Paraspinal and other soft tissues: Negative. Disc levels: No significant disc displacement, foraminal stenosis, or canal stenosis. IMPRESSION: MRI head: Numerous white matter lesions (>20) and a pattern typical of demyelination and multiple sclerosis. Enhancement of 3 lesions in the right brain most pronounced in right superior temporal lobe. Findings satisfy revised McDonald criteria for dissemination in time and space. Differential considerations such as ADEM, NMO, Lyme disease, or vasculopathy are considered unlikely. MRI cervical spine: Three short segment nonenhancing spinal cord lesions in a pattern typical of multiple sclerosis as above. MRI thoracic spine: No spinal  cord lesion identified. Mild motion artifact. Unremarkable thoracic spine MRI. MRI lumbar spine: Normal lumbar spine MRI. Electronically Signed   By: Kristine Garbe M.D.   On: 12/18/2018 17:36   Mr Cervical Spine W Wo Contrast  Result Date: 12/18/2018 CLINICAL DATA:  33 y/o F; generalized numbness that began on the right side Tuesday with progressive worsening, now involving the whole body. EXAM: MRI HEAD WITHOUT AND WITH CONTRAST MRI CERVICAL SPINE  WITHOUT AND WITH CONTRAST MRI THORACIC WITHOUT AND WITH CONTRAST MRI LUMBAR SPINE WITHOUT AND WITH CONTRAST TECHNIQUE: Multiplanar, multiecho pulse sequences of the brain and surrounding structures, and cervical spine, to include the craniocervical junction and cervicothoracic junction, were obtained without and with intravenous contrast. Multiplanar and multiecho pulse sequences of the thoracic and lumbar spine were obtained without and with intravenous contrast. CONTRAST:  7 cc Gadavist COMPARISON:  None. FINDINGS: MRI HEAD FINDINGS Brain: Numerous T2 FLAIR hyperintense white matter lesions are present (greater than 20) involving juxta cortical white matter, periventricular white matter, corpus callosum (genu, body, splenium), left greater than right genu of internal capsule, left medial thalamus, left superior cerebellar peduncle and medial cerebellar white matter, right brachium pontis, and the left hemi pons. Periventricular lesions have an ovoid radial configuration relative to the ventricular system. After administration of intravenous contrast there is faint enhancement of 2 lesions in left frontal periventricular and left parietal subcortical white matter (series 70, image 7 and 9) as well as avid enhancement of a lesion within the right superior temporal gyrus (series 70, image 5). No structural abnormality of the brain identified. No findings of stroke, hemorrhage, mass effect, extra-axial collection, hydrocephalus, or herniation. Vascular: Right postcentral gyrus developmental venous anomaly. Skull and upper cervical spine: Normal marrow signal. Sinuses/Orbits: Mild left maxillary sinus mucosal thickening. No additional abnormal signal of visible paranasal sinuses or the mastoid air cells. Orbits are unremarkable. Other: Negative. MRI CERVICAL SPINE FINDINGS Alignment: Straightening of cervical lordosis without listhesis. Vertebrae: No fracture, evidence of discitis, or bone lesion. No abnormal  enhancement. Cord: No abnormal enhancement. Short segment T2 hyperintense cord lesions as follows: 1. Right lateral cord, C2-3 2. Dorsal column in right posterior horn, C4 3. Left central cord extending to left posterior column, C6 Posterior Fossa, vertebral arteries, paraspinal tissues: Negative. Disc levels: No significant disc displacement, foraminal stenosis, or canal stenosis. MRI THORACIC SPINE FINDINGS Alignment:  Physiologic. Vertebrae: No fracture, evidence of discitis, or bone lesion. No abnormal enhancement. Cord: Mild motion artifact of axial sequences. Normal signal and morphology. No abnormal enhancement Paraspinal and other soft tissues: Negative. Disc levels: Small right central disc protrusion with ventral thecal sac effacement. No additional level of disc displacement. No foraminal stenosis, canal stenosis, or neural impingement. MRI LUMBAR SPINE FINDINGS Segmentation:  Standard. Alignment:  Physiologic. Vertebrae: No fracture, evidence of discitis, or bone lesion. No abnormal enhancement. Conus medullaris: Extends to the L1-2 level and appears normal. No abnormal enhancement. Paraspinal and other soft tissues: Negative. Disc levels: No significant disc displacement, foraminal stenosis, or canal stenosis. IMPRESSION: MRI head: Numerous white matter lesions (>20) and a pattern typical of demyelination and multiple sclerosis. Enhancement of 3 lesions in the right brain most pronounced in right superior temporal lobe. Findings satisfy revised McDonald criteria for dissemination in time and space. Differential considerations such as ADEM, NMO, Lyme disease, or vasculopathy are considered unlikely. MRI cervical spine: Three short segment nonenhancing spinal cord lesions in a pattern typical of multiple sclerosis as above. MRI thoracic spine: No spinal cord lesion  identified. Mild motion artifact. Unremarkable thoracic spine MRI. MRI lumbar spine: Normal lumbar spine MRI. Electronically Signed   By:  Kristine Garbe M.D.   On: 12/18/2018 17:36   Mr Thoracic Spine W Wo Contrast  Result Date: 12/18/2018 CLINICAL DATA:  33 y/o F; generalized numbness that began on the right side Tuesday with progressive worsening, now involving the whole body. EXAM: MRI HEAD WITHOUT AND WITH CONTRAST MRI CERVICAL SPINE WITHOUT AND WITH CONTRAST MRI THORACIC WITHOUT AND WITH CONTRAST MRI LUMBAR SPINE WITHOUT AND WITH CONTRAST TECHNIQUE: Multiplanar, multiecho pulse sequences of the brain and surrounding structures, and cervical spine, to include the craniocervical junction and cervicothoracic junction, were obtained without and with intravenous contrast. Multiplanar and multiecho pulse sequences of the thoracic and lumbar spine were obtained without and with intravenous contrast. CONTRAST:  7 cc Gadavist COMPARISON:  None. FINDINGS: MRI HEAD FINDINGS Brain: Numerous T2 FLAIR hyperintense white matter lesions are present (greater than 20) involving juxta cortical white matter, periventricular white matter, corpus callosum (genu, body, splenium), left greater than right genu of internal capsule, left medial thalamus, left superior cerebellar peduncle and medial cerebellar white matter, right brachium pontis, and the left hemi pons. Periventricular lesions have an ovoid radial configuration relative to the ventricular system. After administration of intravenous contrast there is faint enhancement of 2 lesions in left frontal periventricular and left parietal subcortical white matter (series 70, image 7 and 9) as well as avid enhancement of a lesion within the right superior temporal gyrus (series 70, image 5). No structural abnormality of the brain identified. No findings of stroke, hemorrhage, mass effect, extra-axial collection, hydrocephalus, or herniation. Vascular: Right postcentral gyrus developmental venous anomaly. Skull and upper cervical spine: Normal marrow signal. Sinuses/Orbits: Mild left maxillary sinus  mucosal thickening. No additional abnormal signal of visible paranasal sinuses or the mastoid air cells. Orbits are unremarkable. Other: Negative. MRI CERVICAL SPINE FINDINGS Alignment: Straightening of cervical lordosis without listhesis. Vertebrae: No fracture, evidence of discitis, or bone lesion. No abnormal enhancement. Cord: No abnormal enhancement. Short segment T2 hyperintense cord lesions as follows: 1. Right lateral cord, C2-3 2. Dorsal column in right posterior horn, C4 3. Left central cord extending to left posterior column, C6 Posterior Fossa, vertebral arteries, paraspinal tissues: Negative. Disc levels: No significant disc displacement, foraminal stenosis, or canal stenosis. MRI THORACIC SPINE FINDINGS Alignment:  Physiologic. Vertebrae: No fracture, evidence of discitis, or bone lesion. No abnormal enhancement. Cord: Mild motion artifact of axial sequences. Normal signal and morphology. No abnormal enhancement Paraspinal and other soft tissues: Negative. Disc levels: Small right central disc protrusion with ventral thecal sac effacement. No additional level of disc displacement. No foraminal stenosis, canal stenosis, or neural impingement. MRI LUMBAR SPINE FINDINGS Segmentation:  Standard. Alignment:  Physiologic. Vertebrae: No fracture, evidence of discitis, or bone lesion. No abnormal enhancement. Conus medullaris: Extends to the L1-2 level and appears normal. No abnormal enhancement. Paraspinal and other soft tissues: Negative. Disc levels: No significant disc displacement, foraminal stenosis, or canal stenosis. IMPRESSION: MRI head: Numerous white matter lesions (>20) and a pattern typical of demyelination and multiple sclerosis. Enhancement of 3 lesions in the right brain most pronounced in right superior temporal lobe. Findings satisfy revised McDonald criteria for dissemination in time and space. Differential considerations such as ADEM, NMO, Lyme disease, or vasculopathy are considered  unlikely. MRI cervical spine: Three short segment nonenhancing spinal cord lesions in a pattern typical of multiple sclerosis as above. MRI thoracic spine: No spinal cord lesion identified.  Mild motion artifact. Unremarkable thoracic spine MRI. MRI lumbar spine: Normal lumbar spine MRI. Electronically Signed   By: Kristine Garbe M.D.   On: 12/18/2018 17:36   Mr Lumbar Spine W Wo Contrast  Result Date: 12/18/2018 CLINICAL DATA:  33 y/o F; generalized numbness that began on the right side Tuesday with progressive worsening, now involving the whole body. EXAM: MRI HEAD WITHOUT AND WITH CONTRAST MRI CERVICAL SPINE WITHOUT AND WITH CONTRAST MRI THORACIC WITHOUT AND WITH CONTRAST MRI LUMBAR SPINE WITHOUT AND WITH CONTRAST TECHNIQUE: Multiplanar, multiecho pulse sequences of the brain and surrounding structures, and cervical spine, to include the craniocervical junction and cervicothoracic junction, were obtained without and with intravenous contrast. Multiplanar and multiecho pulse sequences of the thoracic and lumbar spine were obtained without and with intravenous contrast. CONTRAST:  7 cc Gadavist COMPARISON:  None. FINDINGS: MRI HEAD FINDINGS Brain: Numerous T2 FLAIR hyperintense white matter lesions are present (greater than 20) involving juxta cortical white matter, periventricular white matter, corpus callosum (genu, body, splenium), left greater than right genu of internal capsule, left medial thalamus, left superior cerebellar peduncle and medial cerebellar white matter, right brachium pontis, and the left hemi pons. Periventricular lesions have an ovoid radial configuration relative to the ventricular system. After administration of intravenous contrast there is faint enhancement of 2 lesions in left frontal periventricular and left parietal subcortical white matter (series 70, image 7 and 9) as well as avid enhancement of a lesion within the right superior temporal gyrus (series 70, image 5). No  structural abnormality of the brain identified. No findings of stroke, hemorrhage, mass effect, extra-axial collection, hydrocephalus, or herniation. Vascular: Right postcentral gyrus developmental venous anomaly. Skull and upper cervical spine: Normal marrow signal. Sinuses/Orbits: Mild left maxillary sinus mucosal thickening. No additional abnormal signal of visible paranasal sinuses or the mastoid air cells. Orbits are unremarkable. Other: Negative. MRI CERVICAL SPINE FINDINGS Alignment: Straightening of cervical lordosis without listhesis. Vertebrae: No fracture, evidence of discitis, or bone lesion. No abnormal enhancement. Cord: No abnormal enhancement. Short segment T2 hyperintense cord lesions as follows: 1. Right lateral cord, C2-3 2. Dorsal column in right posterior horn, C4 3. Left central cord extending to left posterior column, C6 Posterior Fossa, vertebral arteries, paraspinal tissues: Negative. Disc levels: No significant disc displacement, foraminal stenosis, or canal stenosis. MRI THORACIC SPINE FINDINGS Alignment:  Physiologic. Vertebrae: No fracture, evidence of discitis, or bone lesion. No abnormal enhancement. Cord: Mild motion artifact of axial sequences. Normal signal and morphology. No abnormal enhancement Paraspinal and other soft tissues: Negative. Disc levels: Small right central disc protrusion with ventral thecal sac effacement. No additional level of disc displacement. No foraminal stenosis, canal stenosis, or neural impingement. MRI LUMBAR SPINE FINDINGS Segmentation:  Standard. Alignment:  Physiologic. Vertebrae: No fracture, evidence of discitis, or bone lesion. No abnormal enhancement. Conus medullaris: Extends to the L1-2 level and appears normal. No abnormal enhancement. Paraspinal and other soft tissues: Negative. Disc levels: No significant disc displacement, foraminal stenosis, or canal stenosis. IMPRESSION: MRI head: Numerous white matter lesions (>20) and a pattern typical of  demyelination and multiple sclerosis. Enhancement of 3 lesions in the right brain most pronounced in right superior temporal lobe. Findings satisfy revised McDonald criteria for dissemination in time and space. Differential considerations such as ADEM, NMO, Lyme disease, or vasculopathy are considered unlikely. MRI cervical spine: Three short segment nonenhancing spinal cord lesions in a pattern typical of multiple sclerosis as above. MRI thoracic spine: No spinal cord lesion identified. Mild motion  artifact. Unremarkable thoracic spine MRI. MRI lumbar spine: Normal lumbar spine MRI. Electronically Signed   By: Kristine Garbe M.D.   On: 12/18/2018 17:36        Scheduled Meds: . enoxaparin (LOVENOX) injection  40 mg Subcutaneous Q24H  . pantoprazole  40 mg Oral Daily   Continuous Infusions:    LOS: 2 days        Aline August, MD [facility] Pager [phone]  If 7PM-7AM, please contact night-coverage [email or web address] Password [facility] 12/20/2018, 10:18 AM

## 2018-12-20 NOTE — Progress Notes (Cosign Needed)
Patient had uneventful day. Patient is stable.Continue care as planned.

## 2018-12-21 ENCOUNTER — Other Ambulatory Visit: Payer: Self-pay

## 2018-12-21 MED ORDER — NON FORMULARY: 3.0000 mg | Freq: Once | Status: DC

## 2018-12-21 MED ORDER — SODIUM CHLORIDE 0.9 % IV SOLN
1000.0000 mg | Freq: Every day | INTRAVENOUS | Status: AC
  Administered 2018-12-21 – 2018-12-22 (×2): 1000 mg via INTRAVENOUS
  Filled 2018-12-21 (×2): qty 8

## 2018-12-21 MED ORDER — MELATONIN 3 MG PO TABS
3.0000 mg | ORAL_TABLET | Freq: Every day | ORAL | Status: DC
  Administered 2018-12-21: 3 mg via ORAL
  Filled 2018-12-21: qty 1

## 2018-12-21 NOTE — Progress Notes (Signed)
[name], female   DOB: [birth year], 33 y.o.   MRN: [id number]  PROGRESS NOTE    Chloe Pittman  XHB:[id number] DOB: [birth year] DOA: 12/18/2018 PCP: Dineen Kid, MD   Brief Narrative:  33 year old female with history of varicella, external hemorrhoids status post ovariectomy presented with numbness and weakness.  MRI imaging showed new onset multiple sclerosis.  Neurology was consulted.  She was started on IV Solu-Medrol.   Assessment & Plan:   Active Problems:   Multiple sclerosis ([place])   New onset of multiple sclerosis with numbness and paresthesias -Continue 1 g IV Solu-Medrol for 5 days, today is day #4.  Neurology following.  Continue oral PPI. -PT and OT recommend outpatient PT/OT  DVT prophylaxis: Lovenox Code Status: Full Family Communication: None at bedside Disposition Plan: Home once cleared by neurology  Consultants: Neurology  Procedures: None Antimicrobials: None   Subjective: Patient seen and examined at bedside.  No overnight fever, nausea or vomiting.  No worsening weakness, numbness or visual symptoms. Objective: Vitals:   12/20/18 0426 12/20/18 1349 12/20/18 2200 12/21/18 0520  BP: (!) 99/52 (!) 103/50 (!) 105/52 (!) 97/41  Pulse: 67 77 63 (!) 53  Resp: 18 19 18 16   Temp: 98 F (36.7 C) 99 F (37.2 C) 98.7 F (37.1 C) 98.3 F (36.8 C)  TempSrc: Oral Oral Oral Oral  SpO2: 96% 97% 97% 97%  Weight:       No intake or output data in the 24 hours ending 12/21/18 0949 Filed Weights   12/19/18 0500  Weight: 74.6 kg    Examination:  General exam: No acute distress.   Respiratory system: Bilateral decreased breath sounds at bases  Data Reviewed: I have personally reviewed following labs and imaging studies  CBC: Recent Labs  Lab 12/18/18 1210 12/19/18 0359  WBC 8.0 3.8*  HGB 13.2 12.7  HCT 42.3 39.5  MCV 95.3 92.9  PLT 242 810   Basic Metabolic Panel: Recent Labs  Lab 12/18/18 1210 12/18/18 1924 12/19/18 0359  NA  138  --  139  K 4.0  --  4.1  CL 105  --  110  CO2 25  --  23  GLUCOSE 85  --  149*  BUN 14  --  17  CREATININE 0.89  --  0.93  CALCIUM 9.3  --  9.0  MG 2.2  --   --   PHOS  --  3.0  --    GFR: CrCl cannot be calculated (Unknown ideal weight.). Liver Function Tests: Recent Labs  Lab 12/19/18 0359  AST 16  ALT 15  ALKPHOS 35*  BILITOT 0.8  PROT 7.2  ALBUMIN 4.1   No results for input(s): LIPASE, AMYLASE in the last 168 hours. No results for input(s): AMMONIA in the last 168 hours. Coagulation Profile: No results for input(s): INR, PROTIME in the last 168 hours. Cardiac Enzymes: No results for input(s): CKTOTAL, CKMB, CKMBINDEX, TROPONINI in the last 168 hours. BNP (last 3 results) No results for input(s): PROBNP in the last 8760 hours. HbA1C: Recent Labs    12/19/18 0359  HGBA1C 5.4   CBG: Recent Labs  Lab 12/19/18 0744 12/20/18 0757  GLUCAP 107* 150*   Lipid Profile: No results for input(s): CHOL, HDL, LDLCALC, TRIG, CHOLHDL, LDLDIRECT in the last 72 hours. Thyroid Function Tests: Recent Labs    12/18/18 1230  TSH 2.377   Anemia Panel: No results for input(s): VITAMINB12, FOLATE, FERRITIN, TIBC, IRON, RETICCTPCT in the last  72 hours. Sepsis Labs: No results for input(s): PROCALCITON, LATICACIDVEN in the last 168 hours.  No results found for this or any previous visit (from the past 240 hour(s)).       Radiology Studies: No results found.      Scheduled Meds: . enoxaparin (LOVENOX) injection  40 mg Subcutaneous Q24H  . pantoprazole  40 mg Oral Daily   Continuous Infusions: . methylPREDNISolone (SOLU-MEDROL) injection       LOS: 3 days        Aline August, MD [facility] Pager [phone]  If 7PM-7AM, please contact night-coverage [email or web address] Password [facility] 12/21/2018, 9:49 AM

## 2018-12-22 LAB — VITAMIN D 25 HYDROXY (VIT D DEFICIENCY, FRACTURES): Vit D, 25-Hydroxy: 28.9 ng/mL — ABNORMAL LOW (ref 30.0–100.0)

## 2018-12-22 NOTE — Progress Notes (Signed)
Patient left floor via wheelchair accompanied by staff and family.

## 2018-12-22 NOTE — Progress Notes (Signed)
Chloe Pittman discharged Home with home health per MD order.  Discharge instructions reviewed and discussed with the patient, all questions and concerns answered. Copy of instructions, care notes and scripts given to patient.  Allergies as of 12/22/2018      Reactions   Sulfa Antibiotics Nausea Only      Medication List    STOP taking these medications   AMBULATORY NON FORMULARY MEDICATION   naproxen 500 MG tablet Commonly known as:  NAPROSYN   oxyCODONE 5 MG immediate release tablet Commonly known as:  Oxy IR/ROXICODONE       IV site discontinued and catheter remains intact. Site without signs and symptoms of complications. Dressing and pressure applied.  Patient escorted to car by NT/volunteer in a wheelchair,  no distress noted upon discharge.  Wynetta Emery, Virgina Deakins C 12/22/2018 11:33 AM

## 2018-12-22 NOTE — Progress Notes (Signed)
Occupational Therapy Treatment Patient Details Name: Chloe Pittman MRN: [id number] DOB: 04-08-1986 Today's Date: 12/22/2018    History of present illness Patient is a 33 year old female sent to ED by her PCP for reports of numbness and parathesias that began 3 days ago and were progressive. Patient testing revealed spinal cord lesions consistent with MS.    OT comments  Pt progressing toward established OT goals. Pt currently reports of increased difficulty with RUE coordination stating she has "difficulty grasping items" and has been "knocking items over" when reaching for them.  Pt able to complete UB dressing at mod Independent demonstrating increased difficulty with grasping zipper and minor instability while standing to complete dressing. Provided pt tubing to assist with grasping, educated pt about fine motor coordination exercises on provided handout. Discussed appropriateness of driving with pt, who plans to follow-up with physician. Pt expressed preference of HHOT vs outpatient OT due to decreased caregiver support. Pt will continue to benefit from skilled OT services.  Pt's current level of functioning appropriate for d/c when she is medically stable.    Follow Up Recommendations  Home health OT    Equipment Recommendations  None recommended by OT    Recommendations for Other Services PT consult    Precautions / Restrictions Precautions Precautions: Fall Restrictions Weight Bearing Restrictions: No       Mobility Bed Mobility Overal bed mobility: Independent                Transfers Overall transfer level: Modified independent Equipment used: None             General transfer comment: increased time during functional mobility;minor instability noted    Balance                                           ADL either performed or assessed with clinical judgement   ADL Overall ADL's : Modified independent                                       General ADL Comments: pt able to perform ADL taking her time through movements and focusing on the task at hand;pt donned jacket and zipped/unzipped with modI with increased time and effort;demonstrated difficulty grasping zipper;      Vision       Perception     Praxis      Cognition Arousal/Alertness: Awake/alert Behavior During Therapy: WFL for tasks assessed/performed Overall Cognitive Status: Within Functional Limits for tasks assessed                                 General Comments: pt continues to do personal research on M.S.,        Exercises     Shoulder Instructions       General Comments provided pt with fine motor coordination handout and theraputty;educated pt to do exercises as tolerated noting to not over exert herself;discussed driving risk factors associated with decreased UE/LE strength, coordination, and sensation, pt plans to follow up with her physician. 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;pt's family present during session    Pertinent Vitals/ Pain       Pain Assessment: No/denies pain  Home Living                                          Prior Functioning/Environment              Frequency  Min 2X/week        Progress Toward Goals  OT Goals(current goals can now be found in the care plan section)  Progress towards OT goals: Progressing toward goals  Acute Rehab OT Goals Patient Stated Goal: to be able to care for her daughter safely OT Goal Formulation: With patient Time For Goal Achievement: 01/02/19 Potential to Achieve Goals: Good ADL Goals Pt Will Perform Grooming: Independently Pt/caregiver will Perform Home Exercise Program: Increased  strength;Both right and left upper extremity;With written HEP provided;Independently Additional ADL Goal #1: Pt will independently demonstrate understanding of strategies to compensate for sensory deficits in RUE.  Plan Discharge plan needs to be updated    Co-evaluation                 AM-PAC OT "6 Clicks" Daily Activity     Outcome Measure   Help from another person eating meals?: None Help from another person taking care of personal grooming?: None Help from another person toileting, which includes using toliet, bedpan, or urinal?: None Help from another person bathing (including washing, rinsing, drying)?: None Help from another person to put on and taking off regular upper body clothing?: None Help from another person to put on and taking off regular lower body clothing?: None 6 Click Score: 24    End of Session    OT Visit Diagnosis: Other abnormalities of gait and mobility (R26.89);Other symptoms and signs involving the nervous system (R29.898)   Activity Tolerance Patient tolerated treatment well   Patient Left in bed;with family/visitor present;with call bell/phone within reach   Nurse Communication Mobility status        Time: 0912-0950 OT Time Calculation (min): 38 min  Charges: OT General Charges $OT Visit: 1 Visit OT Treatments $Self Care/Home Management : 23-37 mins $Therapeutic Exercise: 8-22 mins  Dorinda Hill OTR/L Acute Rehabilitation Services Office: [place] 12/22/2018, 11:10 AM

## 2018-12-22 NOTE — Progress Notes (Signed)
PT Cancellation Note  Patient Details Name: Chloe Pittman MRN: [id number] DOB: 16-Dec-1985   Cancelled Treatment:    Reason Eval/Treat Not Completed: (P) Medical issues which prohibited therapy(Pt getting IV started will f/u per POC.  )   [name] [name] 12/22/2018, 9:56 AM  [name], PTA Acute Rehabilitation Services Pager [phone] Office [phone]

## 2018-12-22 NOTE — Discharge Summary (Signed)
Physician Discharge Summary  Brightyn Mozer CXK:[id number] DOB: 1986/07/20 DOA: 12/18/2018  PCP: Dineen Kid, MD  Admit date: 12/18/2018 Discharge date: 12/22/2018  Admitted From: Home Disposition: Home  Recommendations for Outpatient Follow-up:  1. Follow up with PCP in 1 week 2. Outpatient follow-up with neurology/Dr. Felecia Shelling at earliest convenience 3. Follow-up in the ED if symptoms worsen or new appear   Home Health: Home health PT/OT Equipment/Devices: None  Discharge Condition: Stable CODE STATUS: Full Diet recommendation: Regular  Brief/Interim Summary: 34 year old female with history of varicella, external hemorrhoids status post ovariectomy presented with numbness and weakness.  MRI imaging showed new onset multiple sclerosis.  Neurology was consulted.  She was started on IV Solu-Medrol.  She completed 5 days of IV Solu-Medrol 1 g daily.  Her symptoms have not worsened.  She will be discharged today with outpatient follow-up with Dr. Sater/neurology at earliest convenience, she has an appointment on December 25, 2018.  Discharge Diagnoses:  Active Problems:   Multiple sclerosis ([place])   New onset of multiple sclerosis with numbness and paresthesias -Today's day #5 of 1 g IV Solu-Medrol.    Neurology evaluation appreciated.  No worsening of her symptoms.  Her symptoms have not much improved as well.  She will be discharged home today after her last dose of Solu-Medrol  with outpatient follow-up with Dr. Sater/neurology at earliest convenience, she has an appointment on December 25, 2018. -PT/OT recommended home health PT/OT.  Discharge Instructions  Discharge Instructions    Diet general   Complete by:  As directed    Increase activity slowly   Complete by:  As directed      Allergies as of 12/22/2018      Reactions   Sulfa Antibiotics Nausea Only      Medication List    STOP taking these medications   AMBULATORY NON FORMULARY MEDICATION   naproxen 500 MG  tablet Commonly known as:  NAPROSYN   oxyCODONE 5 MG immediate release tablet Commonly known as:  Oxy IR/ROXICODONE      Follow-up Information    Via, Lennette Bihari, MD. Schedule an appointment as soon as possible for a visit in 1 week(s).   Specialty:  Family Medicine Contact information: [place] [address] [phone]        Britt Bottom, MD Follow up.   Specialty:  Neurology Why:  Keep scheduled appointment Contact information: [address] [place] [address] [phone]          Allergies  Allergen Reactions  . Sulfa Antibiotics Nausea Only    Consultations:  Neurology   Procedures/Studies: Mr Jeri Cos Wo Contrast  Result Date: 12/18/2018 CLINICAL DATA:  33 y/o F; generalized numbness that began on the right side Tuesday with progressive worsening, now involving the whole body. EXAM: MRI HEAD WITHOUT AND WITH CONTRAST MRI CERVICAL SPINE WITHOUT AND WITH CONTRAST MRI THORACIC WITHOUT AND WITH CONTRAST MRI LUMBAR SPINE WITHOUT AND WITH CONTRAST TECHNIQUE: Multiplanar, multiecho pulse sequences of the brain and surrounding structures, and cervical spine, to include the craniocervical junction and cervicothoracic junction, were obtained without and with intravenous contrast. Multiplanar and multiecho pulse sequences of the thoracic and lumbar spine were obtained without and with intravenous contrast. CONTRAST:  7 cc Gadavist COMPARISON:  None. FINDINGS: MRI HEAD FINDINGS Brain: Numerous T2 FLAIR hyperintense white matter lesions are present (greater than 20) involving juxta cortical white matter, periventricular white matter, corpus callosum (genu, body, splenium), left greater than right genu of internal capsule,  left medial thalamus, left superior cerebellar peduncle and medial cerebellar white matter, right brachium pontis, and the left hemi pons. Periventricular lesions have an ovoid radial configuration relative to the ventricular system. After  administration of intravenous contrast there is faint enhancement of 2 lesions in left frontal periventricular and left parietal subcortical white matter (series 70, image 7 and 9) as well as avid enhancement of a lesion within the right superior temporal gyrus (series 70, image 5). No structural abnormality of the brain identified. No findings of stroke, hemorrhage, mass effect, extra-axial collection, hydrocephalus, or herniation. Vascular: Right postcentral gyrus developmental venous anomaly. Skull and upper cervical spine: Normal marrow signal. Sinuses/Orbits: Mild left maxillary sinus mucosal thickening. No additional abnormal signal of visible paranasal sinuses or the mastoid air cells. Orbits are unremarkable. Other: Negative. MRI CERVICAL SPINE FINDINGS Alignment: Straightening of cervical lordosis without listhesis. Vertebrae: No fracture, evidence of discitis, or bone lesion. No abnormal enhancement. Cord: No abnormal enhancement. Short segment T2 hyperintense cord lesions as follows: 1. Right lateral cord, C2-3 2. Dorsal column in right posterior horn, C4 3. Left central cord extending to left posterior column, C6 Posterior Fossa, vertebral arteries, paraspinal tissues: Negative. Disc levels: No significant disc displacement, foraminal stenosis, or canal stenosis. MRI THORACIC SPINE FINDINGS Alignment:  Physiologic. Vertebrae: No fracture, evidence of discitis, or bone lesion. No abnormal enhancement. Cord: Mild motion artifact of axial sequences. Normal signal and morphology. No abnormal enhancement Paraspinal and other soft tissues: Negative. Disc levels: Small right central disc protrusion with ventral thecal sac effacement. No additional level of disc displacement. No foraminal stenosis, canal stenosis, or neural impingement. MRI LUMBAR SPINE FINDINGS Segmentation:  Standard. Alignment:  Physiologic. Vertebrae: No fracture, evidence of discitis, or bone lesion. No abnormal enhancement. Conus  medullaris: Extends to the L1-2 level and appears normal. No abnormal enhancement. Paraspinal and other soft tissues: Negative. Disc levels: No significant disc displacement, foraminal stenosis, or canal stenosis. IMPRESSION: MRI head: Numerous white matter lesions (>20) and a pattern typical of demyelination and multiple sclerosis. Enhancement of 3 lesions in the right brain most pronounced in right superior temporal lobe. Findings satisfy revised McDonald criteria for dissemination in time and space. Differential considerations such as ADEM, NMO, Lyme disease, or vasculopathy are considered unlikely. MRI cervical spine: Three short segment nonenhancing spinal cord lesions in a pattern typical of multiple sclerosis as above. MRI thoracic spine: No spinal cord lesion identified. Mild motion artifact. Unremarkable thoracic spine MRI. MRI lumbar spine: Normal lumbar spine MRI. Electronically Signed   By: Kristine Garbe M.D.   On: 12/18/2018 17:36   Mr Cervical Spine W Wo Contrast  Result Date: 12/18/2018 CLINICAL DATA:  33 y/o F; generalized numbness that began on the right side Tuesday with progressive worsening, now involving the whole body. EXAM: MRI HEAD WITHOUT AND WITH CONTRAST MRI CERVICAL SPINE WITHOUT AND WITH CONTRAST MRI THORACIC WITHOUT AND WITH CONTRAST MRI LUMBAR SPINE WITHOUT AND WITH CONTRAST TECHNIQUE: Multiplanar, multiecho pulse sequences of the brain and surrounding structures, and cervical spine, to include the craniocervical junction and cervicothoracic junction, were obtained without and with intravenous contrast. Multiplanar and multiecho pulse sequences of the thoracic and lumbar spine were obtained without and with intravenous contrast. CONTRAST:  7 cc Gadavist COMPARISON:  None. FINDINGS: MRI HEAD FINDINGS Brain: Numerous T2 FLAIR hyperintense white matter lesions are present (greater than 20) involving juxta cortical white matter, periventricular white matter, corpus callosum  (genu, body, splenium), left greater than right genu of internal capsule, left  medial thalamus, left superior cerebellar peduncle and medial cerebellar white matter, right brachium pontis, and the left hemi pons. Periventricular lesions have an ovoid radial configuration relative to the ventricular system. After administration of intravenous contrast there is faint enhancement of 2 lesions in left frontal periventricular and left parietal subcortical white matter (series 70, image 7 and 9) as well as avid enhancement of a lesion within the right superior temporal gyrus (series 70, image 5). No structural abnormality of the brain identified. No findings of stroke, hemorrhage, mass effect, extra-axial collection, hydrocephalus, or herniation. Vascular: Right postcentral gyrus developmental venous anomaly. Skull and upper cervical spine: Normal marrow signal. Sinuses/Orbits: Mild left maxillary sinus mucosal thickening. No additional abnormal signal of visible paranasal sinuses or the mastoid air cells. Orbits are unremarkable. Other: Negative. MRI CERVICAL SPINE FINDINGS Alignment: Straightening of cervical lordosis without listhesis. Vertebrae: No fracture, evidence of discitis, or bone lesion. No abnormal enhancement. Cord: No abnormal enhancement. Short segment T2 hyperintense cord lesions as follows: 1. Right lateral cord, C2-3 2. Dorsal column in right posterior horn, C4 3. Left central cord extending to left posterior column, C6 Posterior Fossa, vertebral arteries, paraspinal tissues: Negative. Disc levels: No significant disc displacement, foraminal stenosis, or canal stenosis. MRI THORACIC SPINE FINDINGS Alignment:  Physiologic. Vertebrae: No fracture, evidence of discitis, or bone lesion. No abnormal enhancement. Cord: Mild motion artifact of axial sequences. Normal signal and morphology. No abnormal enhancement Paraspinal and other soft tissues: Negative. Disc levels: Small right central disc protrusion with  ventral thecal sac effacement. No additional level of disc displacement. No foraminal stenosis, canal stenosis, or neural impingement. MRI LUMBAR SPINE FINDINGS Segmentation:  Standard. Alignment:  Physiologic. Vertebrae: No fracture, evidence of discitis, or bone lesion. No abnormal enhancement. Conus medullaris: Extends to the L1-2 level and appears normal. No abnormal enhancement. Paraspinal and other soft tissues: Negative. Disc levels: No significant disc displacement, foraminal stenosis, or canal stenosis. IMPRESSION: MRI head: Numerous white matter lesions (>20) and a pattern typical of demyelination and multiple sclerosis. Enhancement of 3 lesions in the right brain most pronounced in right superior temporal lobe. Findings satisfy revised McDonald criteria for dissemination in time and space. Differential considerations such as ADEM, NMO, Lyme disease, or vasculopathy are considered unlikely. MRI cervical spine: Three short segment nonenhancing spinal cord lesions in a pattern typical of multiple sclerosis as above. MRI thoracic spine: No spinal cord lesion identified. Mild motion artifact. Unremarkable thoracic spine MRI. MRI lumbar spine: Normal lumbar spine MRI. Electronically Signed   By: Kristine Garbe M.D.   On: 12/18/2018 17:36   Mr Thoracic Spine W Wo Contrast  Result Date: 12/18/2018 CLINICAL DATA:  33 y/o F; generalized numbness that began on the right side Tuesday with progressive worsening, now involving the whole body. EXAM: MRI HEAD WITHOUT AND WITH CONTRAST MRI CERVICAL SPINE WITHOUT AND WITH CONTRAST MRI THORACIC WITHOUT AND WITH CONTRAST MRI LUMBAR SPINE WITHOUT AND WITH CONTRAST TECHNIQUE: Multiplanar, multiecho pulse sequences of the brain and surrounding structures, and cervical spine, to include the craniocervical junction and cervicothoracic junction, were obtained without and with intravenous contrast. Multiplanar and multiecho pulse sequences of the thoracic and lumbar  spine were obtained without and with intravenous contrast. CONTRAST:  7 cc Gadavist COMPARISON:  None. FINDINGS: MRI HEAD FINDINGS Brain: Numerous T2 FLAIR hyperintense white matter lesions are present (greater than 20) involving juxta cortical white matter, periventricular white matter, corpus callosum (genu, body, splenium), left greater than right genu of internal capsule, left medial thalamus,  left superior cerebellar peduncle and medial cerebellar white matter, right brachium pontis, and the left hemi pons. Periventricular lesions have an ovoid radial configuration relative to the ventricular system. After administration of intravenous contrast there is faint enhancement of 2 lesions in left frontal periventricular and left parietal subcortical white matter (series 70, image 7 and 9) as well as avid enhancement of a lesion within the right superior temporal gyrus (series 70, image 5). No structural abnormality of the brain identified. No findings of stroke, hemorrhage, mass effect, extra-axial collection, hydrocephalus, or herniation. Vascular: Right postcentral gyrus developmental venous anomaly. Skull and upper cervical spine: Normal marrow signal. Sinuses/Orbits: Mild left maxillary sinus mucosal thickening. No additional abnormal signal of visible paranasal sinuses or the mastoid air cells. Orbits are unremarkable. Other: Negative. MRI CERVICAL SPINE FINDINGS Alignment: Straightening of cervical lordosis without listhesis. Vertebrae: No fracture, evidence of discitis, or bone lesion. No abnormal enhancement. Cord: No abnormal enhancement. Short segment T2 hyperintense cord lesions as follows: 1. Right lateral cord, C2-3 2. Dorsal column in right posterior horn, C4 3. Left central cord extending to left posterior column, C6 Posterior Fossa, vertebral arteries, paraspinal tissues: Negative. Disc levels: No significant disc displacement, foraminal stenosis, or canal stenosis. MRI THORACIC SPINE FINDINGS  Alignment:  Physiologic. Vertebrae: No fracture, evidence of discitis, or bone lesion. No abnormal enhancement. Cord: Mild motion artifact of axial sequences. Normal signal and morphology. No abnormal enhancement Paraspinal and other soft tissues: Negative. Disc levels: Small right central disc protrusion with ventral thecal sac effacement. No additional level of disc displacement. No foraminal stenosis, canal stenosis, or neural impingement. MRI LUMBAR SPINE FINDINGS Segmentation:  Standard. Alignment:  Physiologic. Vertebrae: No fracture, evidence of discitis, or bone lesion. No abnormal enhancement. Conus medullaris: Extends to the L1-2 level and appears normal. No abnormal enhancement. Paraspinal and other soft tissues: Negative. Disc levels: No significant disc displacement, foraminal stenosis, or canal stenosis. IMPRESSION: MRI head: Numerous white matter lesions (>20) and a pattern typical of demyelination and multiple sclerosis. Enhancement of 3 lesions in the right brain most pronounced in right superior temporal lobe. Findings satisfy revised McDonald criteria for dissemination in time and space. Differential considerations such as ADEM, NMO, Lyme disease, or vasculopathy are considered unlikely. MRI cervical spine: Three short segment nonenhancing spinal cord lesions in a pattern typical of multiple sclerosis as above. MRI thoracic spine: No spinal cord lesion identified. Mild motion artifact. Unremarkable thoracic spine MRI. MRI lumbar spine: Normal lumbar spine MRI. Electronically Signed   By: Kristine Garbe M.D.   On: 12/18/2018 17:36   Mr Lumbar Spine W Wo Contrast  Result Date: 12/18/2018 CLINICAL DATA:  33 y/o F; generalized numbness that began on the right side Tuesday with progressive worsening, now involving the whole body. EXAM: MRI HEAD WITHOUT AND WITH CONTRAST MRI CERVICAL SPINE WITHOUT AND WITH CONTRAST MRI THORACIC WITHOUT AND WITH CONTRAST MRI LUMBAR SPINE WITHOUT AND WITH  CONTRAST TECHNIQUE: Multiplanar, multiecho pulse sequences of the brain and surrounding structures, and cervical spine, to include the craniocervical junction and cervicothoracic junction, were obtained without and with intravenous contrast. Multiplanar and multiecho pulse sequences of the thoracic and lumbar spine were obtained without and with intravenous contrast. CONTRAST:  7 cc Gadavist COMPARISON:  None. FINDINGS: MRI HEAD FINDINGS Brain: Numerous T2 FLAIR hyperintense white matter lesions are present (greater than 20) involving juxta cortical white matter, periventricular white matter, corpus callosum (genu, body, splenium), left greater than right genu of internal capsule, left medial thalamus, left superior  cerebellar peduncle and medial cerebellar white matter, right brachium pontis, and the left hemi pons. Periventricular lesions have an ovoid radial configuration relative to the ventricular system. After administration of intravenous contrast there is faint enhancement of 2 lesions in left frontal periventricular and left parietal subcortical white matter (series 70, image 7 and 9) as well as avid enhancement of a lesion within the right superior temporal gyrus (series 70, image 5). No structural abnormality of the brain identified. No findings of stroke, hemorrhage, mass effect, extra-axial collection, hydrocephalus, or herniation. Vascular: Right postcentral gyrus developmental venous anomaly. Skull and upper cervical spine: Normal marrow signal. Sinuses/Orbits: Mild left maxillary sinus mucosal thickening. No additional abnormal signal of visible paranasal sinuses or the mastoid air cells. Orbits are unremarkable. Other: Negative. MRI CERVICAL SPINE FINDINGS Alignment: Straightening of cervical lordosis without listhesis. Vertebrae: No fracture, evidence of discitis, or bone lesion. No abnormal enhancement. Cord: No abnormal enhancement. Short segment T2 hyperintense cord lesions as follows: 1. Right  lateral cord, C2-3 2. Dorsal column in right posterior horn, C4 3. Left central cord extending to left posterior column, C6 Posterior Fossa, vertebral arteries, paraspinal tissues: Negative. Disc levels: No significant disc displacement, foraminal stenosis, or canal stenosis. MRI THORACIC SPINE FINDINGS Alignment:  Physiologic. Vertebrae: No fracture, evidence of discitis, or bone lesion. No abnormal enhancement. Cord: Mild motion artifact of axial sequences. Normal signal and morphology. No abnormal enhancement Paraspinal and other soft tissues: Negative. Disc levels: Small right central disc protrusion with ventral thecal sac effacement. No additional level of disc displacement. No foraminal stenosis, canal stenosis, or neural impingement. MRI LUMBAR SPINE FINDINGS Segmentation:  Standard. Alignment:  Physiologic. Vertebrae: No fracture, evidence of discitis, or bone lesion. No abnormal enhancement. Conus medullaris: Extends to the L1-2 level and appears normal. No abnormal enhancement. Paraspinal and other soft tissues: Negative. Disc levels: No significant disc displacement, foraminal stenosis, or canal stenosis. IMPRESSION: MRI head: Numerous white matter lesions (>20) and a pattern typical of demyelination and multiple sclerosis. Enhancement of 3 lesions in the right brain most pronounced in right superior temporal lobe. Findings satisfy revised McDonald criteria for dissemination in time and space. Differential considerations such as ADEM, NMO, Lyme disease, or vasculopathy are considered unlikely. MRI cervical spine: Three short segment nonenhancing spinal cord lesions in a pattern typical of multiple sclerosis as above. MRI thoracic spine: No spinal cord lesion identified. Mild motion artifact. Unremarkable thoracic spine MRI. MRI lumbar spine: Normal lumbar spine MRI. Electronically Signed   By: Kristine Garbe M.D.   On: 12/18/2018 17:36     Subjective: Patient seen and examined at bedside.   Denies overnight fever, nausea or vomiting.  No worsening weakness, or visual symptoms.  Discharge Exam: Vitals:   12/21/18 2036 12/22/18 0520  BP: (!) 107/56 98/64  Pulse: 74 (!) 56  Resp: 16 16  Temp: 98.5 F (36.9 C) 98.5 F (36.9 C)  SpO2: 98% 97%   Vitals:   12/21/18 1052 12/21/18 1620 12/21/18 2036 12/22/18 0520  BP:  110/71 (!) 107/56 98/64  Pulse:  74 74 (!) 56  Resp:  16 16 16   Temp:  98.7 F (37.1 C) 98.5 F (36.9 C) 98.5 F (36.9 C)  TempSrc:  Oral Oral Oral  SpO2:  97% 98% 97%  Weight: 70.3 kg     Height: 5\' 6"  (1.676 m)       General: Pt is alert, awake, not in acute distress Respiratory: No signs of respiratory distress   The results of  significant diagnostics from this hospitalization (including imaging, microbiology, ancillary and laboratory) are listed below for reference.     Microbiology: No results found for this or any previous visit (from the past 240 hour(s)).   Labs: BNP (last 3 results) No results for input(s): BNP in the last 8760 hours. Basic Metabolic Panel: Recent Labs  Lab 12/18/18 1210 12/18/18 1924 12/19/18 0359  NA 138  --  139  K 4.0  --  4.1  CL 105  --  110  CO2 25  --  23  GLUCOSE 85  --  149*  BUN 14  --  17  CREATININE 0.89  --  0.93  CALCIUM 9.3  --  9.0  MG 2.2  --   --   PHOS  --  3.0  --    Liver Function Tests: Recent Labs  Lab 12/19/18 0359  AST 16  ALT 15  ALKPHOS 35*  BILITOT 0.8  PROT 7.2  ALBUMIN 4.1   No results for input(s): LIPASE, AMYLASE in the last 168 hours. No results for input(s): AMMONIA in the last 168 hours. CBC: Recent Labs  Lab 12/18/18 1210 12/19/18 0359  WBC 8.0 3.8*  HGB 13.2 12.7  HCT 42.3 39.5  MCV 95.3 92.9  PLT 242 239   Cardiac Enzymes: No results for input(s): CKTOTAL, CKMB, CKMBINDEX, TROPONINI in the last 168 hours. BNP: Invalid input(s): POCBNP CBG: Recent Labs  Lab 12/19/18 0744 12/20/18 0757  GLUCAP 107* 150*   D-Dimer No results for input(s):  DDIMER in the last 72 hours. Hgb A1c No results for input(s): HGBA1C in the last 72 hours. Lipid Profile No results for input(s): CHOL, HDL, LDLCALC, TRIG, CHOLHDL, LDLDIRECT in the last 72 hours. Thyroid function studies No results for input(s): TSH, T4TOTAL, T3FREE, THYROIDAB in the last 72 hours.  Invalid input(s): FREET3 Anemia work up No results for input(s): VITAMINB12, FOLATE, FERRITIN, TIBC, IRON, RETICCTPCT in the last 72 hours. Urinalysis No results found for: COLORURINE, APPEARANCEUR, LABSPEC, [place], GLUCOSEU, HGBUR, BILIRUBINUR, KETONESUR, PROTEINUR, UROBILINOGEN, NITRITE, LEUKOCYTESUR Sepsis Labs Invalid input(s): PROCALCITONIN,  WBC,  LACTICIDVEN Microbiology No results found for this or any previous visit (from the past 240 hour(s)).   Time coordinating discharge: 35 minutes  SIGNED:   Aline August, MD  [facility] 12/22/2018, 8:14 AM Pager: [phone]  If 7PM-7AM, please contact night-coverage [email or web address] Password [email or web address]

## 2018-12-22 NOTE — Progress Notes (Signed)
CSW received consult regarding disability. CSW provided disability application information.   CSW signing off.   Percell Locus Jabar Krysiak LCSW [phone]

## 2018-12-22 NOTE — Progress Notes (Signed)
Physical Therapy Treatment Patient Details Name: Chloe Pittman MRN: [id number] DOB: [birth year] Today's Date: 12/22/2018    History of Present Illness Patient is a 33 year old female sent to ED by her PCP for reports of numbness and parathesias that began 3 days ago and were progressive. Patient testing revealed spinal cord lesions consistent with MS.     PT Comments    Pt performed gait training with minor instability but able to correct LOB without assistance.  Pt performed stair training to simulate entry into home environment.  Plan for OP PT remains appropriate.  Pt eager to return home to her daughter.    Follow Up Recommendations  Outpatient PT     Equipment Recommendations  None recommended by PT    Recommendations for Other Services       Precautions / Restrictions Precautions Precautions: Fall Restrictions Weight Bearing Restrictions: No    Mobility  Bed Mobility Overal bed mobility: Independent                Transfers Overall transfer level: Independent Equipment used: None             General transfer comment: increased time during functional mobility;minor instability noted  Ambulation/Gait Ambulation/Gait assistance: Supervision Gait Distance (Feet): 450 Feet Assistive device: None;Pushed wheelchair(none initially pushed IV pole on the way back) Gait Pattern/deviations: WFL(Within Functional Limits);Ataxic Gait velocity: normal   General Gait Details: Pt with minor balance checks but able to right and regain control.     Stairs Stairs: Yes Stairs assistance: Supervision Stair Management: One rail Right;Alternating pattern Number of Stairs: 15 General stair comments: Cues for sequencing.  Pt with minor LOB and able to right with support from railing.     Wheelchair Mobility    Modified Rankin (Stroke Patients Only)       Balance Overall balance assessment: Mild deficits observed, not formally tested                                           Cognition Arousal/Alertness: Awake/alert Behavior During Therapy: WFL for tasks assessed/performed Overall Cognitive Status: Within Functional Limits for tasks assessed                                 General Comments: pt continues to do personal research on M.S.,      Exercises      General Comments General comments (skin integrity, edema, etc.): provided pt with fine motor coordination handout and theraputty;educated pt to do exercises as tolerated noting to not over exert herself;discussed driving risk factors associated with decreased UE/LE strength, coordination, and sensation, pt plans to follow up with her physician. Discussed research methods to find support groups and resources concerning M.S.;pt expressed she would prefer HHOT over outpatient OT.;pt's family present during session      Pertinent Vitals/Pain Pain Assessment: No/denies pain    Home Living                      Prior Function            PT Goals (current goals can now be found in the care plan section) Acute Rehab PT Goals Patient Stated Goal: to be able to care for her daughter safely Potential to Achieve Goals: Good Progress towards PT goals: Progressing toward  goals    Frequency    Min 3X/week      PT Plan Current plan remains appropriate    Co-evaluation              AM-PAC PT "6 Clicks" Mobility   Outcome Measure  Help needed turning from your back to your side while in a flat bed without using bedrails?: None Help needed moving from lying on your back to sitting on the side of a flat bed without using bedrails?: None Help needed moving to and from a bed to a chair (including a wheelchair)?: None Help needed standing up from a chair using your arms (e.g., wheelchair or bedside chair)?: None Help needed to walk in hospital room?: None Help needed climbing 3-5 steps with a railing? : None 6 Click Score: 24    End of  Session Equipment Utilized During Treatment: Gait belt Activity Tolerance: Patient tolerated treatment well Patient left: in bed;with call bell/phone within reach Nurse Communication: Mobility status PT Visit Diagnosis: Other abnormalities of gait and mobility (R26.89);Muscle weakness (generalized) (M62.81);Other symptoms and signs involving the nervous system (R29.898)     Time: 1050-1105 PT Time Calculation (min) (ACUTE ONLY): 15 min  Charges:  $Gait Training: 8-22 mins                     [name], PTA Acute Rehabilitation Services Pager [phone] Office [phone]     Chloe Pittman 12/22/2018, 11:16 AM

## 2018-12-22 NOTE — Care Management Note (Addendum)
Case Management Note  Patient Details  Name: Chloe Pittman MRN: [id number] Date of Birth: October 28, 1986  Subjective/Objective:  Presents with numbness/ weakness / new onset multiple sclerosis. Resides with husband. PTA independent with ADL's, DME usage.              Sahvanna Mcmanigal (Spouse)     [phone]       PCP: Lennette Bihari VIA    Action/Plan: Transition to home with home health services to follow. Pt unable to drive and has no one to drive her to outpatient therapy per PT/OT recommendations.  Has transportation to home .  Expected Discharge Date:  12/22/18               Expected Discharge Plan:  [place]  In-House Referral:  CSW ( disability )  Discharge planning Services  CM Consult  Post Acute Care Choice:  NA Choice offered to:  Patient  DME Arranged:  N/A DME Agency:  NA  HH Arranged:  PT, OT HH Agency:  [place]  Status of Service:  Completed, signed off  If discussed at [place] of Stay Meetings, dates discussed:    Additional Comments:  Sharin Mons, RN 12/22/2018, 9:26 AM

## 2018-12-24 DIAGNOSIS — Z87891 Personal history of nicotine dependence: Secondary | ICD-10-CM | POA: Diagnosis not present

## 2018-12-24 DIAGNOSIS — G35 Multiple sclerosis: Secondary | ICD-10-CM | POA: Diagnosis not present

## 2018-12-24 LAB — MISC LABCORP TEST (SEND OUT)

## 2018-12-25 ENCOUNTER — Ambulatory Visit: Payer: BLUE CROSS/BLUE SHIELD | Admitting: Neurology

## 2018-12-25 ENCOUNTER — Telehealth: Payer: Self-pay | Admitting: *Deleted

## 2018-12-25 ENCOUNTER — Encounter: Payer: Self-pay | Admitting: Neurology

## 2018-12-25 VITALS — BP 98/60 | HR 78 | Ht 66.0 in | Wt 164.5 lb

## 2018-12-25 DIAGNOSIS — Z79899 Other long term (current) drug therapy: Secondary | ICD-10-CM | POA: Insufficient documentation

## 2018-12-25 DIAGNOSIS — R260 Ataxic gait: Secondary | ICD-10-CM | POA: Insufficient documentation

## 2018-12-25 DIAGNOSIS — R200 Anesthesia of skin: Secondary | ICD-10-CM

## 2018-12-25 DIAGNOSIS — G35 Multiple sclerosis: Secondary | ICD-10-CM

## 2018-12-25 DIAGNOSIS — E559 Vitamin D deficiency, unspecified: Secondary | ICD-10-CM

## 2018-12-25 MED ORDER — VITAMIN D (ERGOCALCIFEROL) 1.25 MG (50000 UNIT) PO CAPS: 50000.0000 [IU] | ORAL_CAPSULE | ORAL | 0 refills | Status: DC

## 2018-12-25 MED ORDER — PREDNISONE 50 MG PO TABS: ORAL_TABLET | ORAL | 0 refills | Status: DC

## 2018-12-25 NOTE — Progress Notes (Signed)
[facility]  PATIENT: Chloe Pittman DOB: Oct 03, 1986  REFERRING DOCTOR OR PCP: Dineen Kid, MD SOURCE: Patient, emergency room/hospital notes, imaging and lab reports, multiple MRI images personally reviewed on PACS.  _________________________________   HISTORICAL  CHIEF COMPLAINT:  Chief Complaint  Patient presents with  . New Patient (Initial Visit)    RM 74 with husband. ED referral for new MS. No family hx. Was at [facility] 12/18/18-12/22/18. Received IV solumedrolx5 days. Did not notice any improvement after this. Having numbness from under breasts down to her feet and whole right arm/hand and backside.  Right hand very stiff. Denies any vision changes, no blurry or double vision.  . Gait Problem    Has trouble ambulating.  . Muscle Pain    Feels like her muscles are very sore. Feels like she had someone punched her. Located on her back, right side, shoulders.    HISTORY OF PRESENT ILLNESS:  I had the pleasure seeing your patient, Chloe Pittman, at the [place] [facility] at [facility] for neurologic consultation regarding her right-sided numbness and abnormal MRI consistent with multiple sclerosis.   She is a 32 year old woman with numbness that developed in the right side eight days ago while at work.    She did not note much clumsiness initially.  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   Symptoms were worse the next day and even worse on Thursday when she had intense numbness and also right arm clumsiness.    The numbness started to involve the left leg on Wednesday, more so Thursday afternoon.   She noted more numbness while in the hospital, especially from the breast down.    MRI's were c/w MS with several enhancing brain lesions and several non-enhancing cervical spine lesions but no definite thoracic spine lesions.  She received 5 days of IV Solumedrol.        Currently, she continues to note abnormal sensation in the  right arm now feeling like the arm is bruised or punched.  She has an intense numbness below the breasts, more on the right.  She also notes numbness in both legs.  There is clumsiness on the right side, arm worse than leg.  She notes mild weakness in the right arm and minimal weakness of the right leg.  None of her symptoms have significantly improved compared to the first day of her steroids a week ago.  She denied any neurologic symptoms such as visual change, numbness, weakness or clumsiness.  However, she has had more fatigue over the past year.    She had had a couple tick bites and had a negative Lyme test last year  She has no FH of MS.  Her sister has psoriasis.  I personally reviewed the MRIs of the brain, cervical, thoracic and lumbar spine performed 12/18/2018.  The MRI of the brain shows multiple T2/flair hyperintense foci, many in the periventricular and juxtacortical white matter.  There are also a couple foci in the left cerebellar hemisphere and left middle cerebellar peduncle.  About 3 foci enhanced after contrast.  The MRI of the cervical spine shows multiple lesions, to the right adjacent to C2-C3, dorsally towards the right adjacent to C4, centrally, little more to the left adjacent to C6.  The MRI of the thoracic spine did not show any significant findings.   The lumbar spine was essentially normal.  I reviewed the blood work she  had in the hospital.  Her white blood cell counts were slightly low.  Vitamin D was 28.9, HIV was negative.  REVIEW OF SYSTEMS: Constitutional: No fevers, chills, sweats, or change in appetite.  She has fatigue Eyes: No visual changes, double vision, eye pain Ear, nose and throat: No Pittman loss, ear pain, nasal congestion, sore throat Cardiovascular: No chest pain, palpitations Respiratory: No shortness of breath at rest or with exertion.   No wheezes GastrointestinaI: No nausea, vomiting, diarrhea, abdominal pain, fecal incontinence Genitourinary: No  dysuria, urinary retention or frequency.  No nocturia. Musculoskeletal: No neck pain, back pain Integumentary: No rash, pruritus, skin lesions Neurological: as above Psychiatric: No depression at this time.  No anxiety Endocrine: No palpitations, diaphoresis, change in appetite, change in weigh or increased thirst Hematologic/Lymphatic: No anemia, purpura, petechiae. Allergic/Immunologic: No itchy/runny eyes, nasal congestion, recent allergic reactions, rashes  ALLERGIES: Allergies  Allergen Reactions  . Sulfa Antibiotics Nausea Only    HOME MEDICATIONS:  Current Outpatient Medications:  .  predniSONE (DELTASONE) 50 MG tablet, 10 po x 3 day (500 g) then 8 po x 1d then 6 po x 1d, then 4 po x 1d, then 2 po x1d, then 1 po x 1d, then 1/2 po x 1d.   HIGH DOSE TAPER FOR MS EXACERBATION., Disp: 52 tablet, Rfl: 0 .  Vitamin D, Ergocalciferol, (DRISDOL) 1.25 MG (50000 UT) CAPS capsule, Take 1 capsule (50,000 Units total) by mouth every 7 (seven) days., Disp: 13 capsule, Rfl: 0  PAST MEDICAL HISTORY: Past Medical History:  Diagnosis Date  . Hx of varicella   . Medical history non-contributory     PAST SURGICAL HISTORY: Past Surgical History:  Procedure Laterality Date  . FACIAL COSMETIC SURGERY     dog bite  . HEMORRHOID SURGERY N/A 05/14/2018   Procedure: HEMORRHOIDECTOMY;  Surgeon: Michael Boston, MD;  Location: [facility];  Service: General;  Laterality: N/A;  . INNER EAR SURGERY    . PEXY N/A 05/14/2018   Procedure: HEMORRHOIDAL LIGATION/PEXY;  Surgeon: Michael Boston, MD;  Location: [facility];  Service: General;  Laterality: N/A;  . RECTAL EXAM UNDER ANESTHESIA N/A 05/14/2018   Procedure: ANORECTAL EXAM UNDER ANESTHESIA;  Surgeon: Michael Boston, MD;  Location: [facility];  Service: General;  Laterality: N/A;  . TUBES IN EARS      FAMILY HISTORY: Family History  Problem Relation Age of Onset  . Cancer Father        TONSIL   . Hypertension Father   . Diabetes Father   . Cancer Paternal  Aunt        SKIN  . Heart disease Maternal Grandmother   . Cancer Paternal Grandmother        LUNG AND LIVER  . Stroke Paternal Grandmother   . Diabetes Mother     SOCIAL HISTORY:  Social History   Socioeconomic History  . Marital status: Married    Spouse name: Not on file  . Number of children: 1  . Years of education: Associates degree  . Highest education level: Not on file  Occupational History  . Occupation: [employer]  Social Needs  . Financial resource strain: Not on file  . Food insecurity:    Worry: Not on file    Inability: Not on file  . Transportation needs:    Medical: Not on file    Non-medical: Not on file  Tobacco Use  . Smoking status: Former Research scientist (life sciences)  . Smokeless tobacco: Never Used  . Tobacco  comment: Last smoked 5-19, just one cigarette  Substance and Sexual Activity  . Alcohol use: Yes    Comment: SOCIAL - WEEK ENDS   . Drug use: No  . Sexual activity: Yes    Birth control/protection: None  Lifestyle  . Physical activity:    Days per week: Not on file    Minutes per session: Not on file  . Stress: Not on file  Relationships  . Social connections:    Talks on phone: Not on file    Gets together: Not on file    Attends religious service: Not on file    Active member of club or organization: Not on file    Attends meetings of clubs or organizations: Not on file    Relationship status: Not on file  . Intimate partner violence:    Fear of current or ex partner: Not on file    Emotionally abused: Not on file    Physically abused: Not on file    Forced sexual activity: Not on file  Other Topics Concern  . Not on file  Social History Narrative   Lives with husband    Caffeine use: 2 per day   Right handed      PHYSICAL EXAM  Vitals:   12/25/18 0941  BP: 98/60  Pulse: 78  Weight: 164 lb 8 oz (74.6 kg)  Height: 5\' 6"  (1.676 m)    Body mass index is 26.55 kg/m.   General: The patient is well-developed and  well-nourished and in no acute distress  Eyes:  Funduscopic exam shows normal optic discs and retinal vessels.  Neck: The neck is supple, no carotid bruits are noted.  The neck is nontender.  Cardiovascular: The heart has a regular rate and rhythm with a normal S1 and S2. There were no murmurs, gallops or rubs. Lungs are clear to auscultation.  Skin: Extremities are without significant edema.  Musculoskeletal:  Back is nontender  Neurologic Exam  Mental status: The patient is alert and oriented x 3 at the time of the examination. The patient has apparent normal recent and remote memory, with an apparently normal attention span and concentration ability.   Speech is normal.  Cranial nerves: Extraocular movements are full. Pupils are equal, round, and reactive to light and accomodation.  Color vision is normal.  Facial symmetry is present. There is good facial sensation to soft touch bilaterally.Facial strength is normal.  Trapezius and sternocleidomastoid strength is normal. No dysarthria is noted.  The tongue is midline, and the patient has symmetric elevation of the soft palate. No obvious Pittman deficits are noted.  Motor:  Muscle bulk is normal.   Tone is normal. Strength is 4+/5 in the right arm (except 5/5 deltoid) and 5 / 5 in in the left arm and both legs  Sensory: She reports slightly altered sensation to touch in the right arm relative to the left.  Sensation in the trunk appear to be symmetric.  She has reduced vibration sensation on the right, leg more asymmetric than the arm.  Coordination: Cerebellar testing reveals reduced right finger-nose-finger and bilateral heel-to-shin  Gait and station: Station is normal.   The gait is wide with an unstable turn.  She cannot do a tandem walk well. Romberg is borderline positive.   Reflexes: Deep tendon reflexes are normal in the arms but increased in the legs, right greater than left.  There is no ankle clonus.  Plantar responses are  normal.    DIAGNOSTIC DATA (  LABS, IMAGING, TESTING) - I reviewed patient records, labs, notes, testing and imaging myself where available.  Lab Results  Component Value Date   WBC 3.8 (L) 12/19/2018   HGB 12.7 12/19/2018   HCT 39.5 12/19/2018   MCV 92.9 12/19/2018   PLT 239 12/19/2018      Component Value Date/Time   NA 139 12/19/2018 0359   K 4.1 12/19/2018 0359   CL 110 12/19/2018 0359   CO2 23 12/19/2018 0359   GLUCOSE 149 (H) 12/19/2018 0359   BUN 17 12/19/2018 0359   CREATININE 0.93 12/19/2018 0359   CREATININE 0.84 10/06/2014 1245   CALCIUM 9.0 12/19/2018 0359   PROT 7.2 12/19/2018 0359   ALBUMIN 4.1 12/19/2018 0359   AST 16 12/19/2018 0359   ALT 15 12/19/2018 0359   ALKPHOS 35 (L) 12/19/2018 0359   BILITOT 0.8 12/19/2018 0359   GFRNONAA >60 12/19/2018 0359   GFRAA >60 12/19/2018 0359   Lab Results  Component Value Date   CHOL 140 10/06/2014   HDL 56 11/14/2012   LDLCALC 75 11/14/2012   TRIG 31 11/14/2012   CHOLHDL 2.4 11/14/2012   Lab Results  Component Value Date   HGBA1C 5.4 12/19/2018   No results found for: VITAMINB12 Lab Results  Component Value Date   TSH 2.377 12/18/2018       ASSESSMENT AND PLAN  Multiple sclerosis ([place]) - Plan: Hepatitis B core antibody, total, Hepatitis B surface antigen, QuantiFERON-TB Gold Plus, Hepatitis B surface antibody,qualitative, Stratify JCV Antibody Test ([facility])  High risk medication use - Plan: Hepatitis B core antibody, total, Hepatitis B surface antigen, QuantiFERON-TB Gold Plus, Hepatitis B surface antibody,qualitative, Stratify JCV Antibody Test ([facility])  Numbness  Ataxic gait  Vitamin D deficiency   In summary, Chloe Pittman is a 33 year old woman with the onset of neurologic symptoms with numbness and clumsiness and mild weakness 9 days ago.  MRI of the cervical spine and brain are very consistent with multiple sclerosis.  She had a couple enhancing lesions in the brain but none in the spine.  The  absence of enhancing lesions in the spine is unusual as her acute symptoms map out to the lesions noted in the cervical spine on MRI.  She completed 5 days of IV Solu-Medrol in the hospital but did not get any significant benefit.  I will have her take several more days of a high-dose prednisone taper.  We discussed treatment options for her MS.  Given the aggressiveness of her presentation with 3 spinal plaques and likely two different exacerbations withn one week, I recommend that she be placed on a highly effective therapy as her initial disease modifying treatment.  We discussed Tysabri and Ocrevus as her 2 best options.  We also discussed the risks and benefits (including PML, breast cancer, infection).  Blood work will be performed to rule out chronic infections..  If she is JCV antibody positive, she will be placed on Tysabri.  If she is JCV antibody negative, she will be placed on Ocrevus.   We went over other aspects of MS.  Her vitamin D was mildly low and I will have her supplement with 50,000 units weekly x13 weeks and then switch over to 5000 units daily OTC.  She will discuss vitamin D supplementation, if felt to be safe, for her 4-year-old child with her pediatrician.  I also wrote her out of work for the next 5 weeks to enable her to get started on therapy and have recover further.  I will see her back in a couple months but she will call sooner if there are new or worsening neurologic symptoms.  Thank you for asking me to see Chloe Pittman.  Please let me know if I can be of further assistance with her or other patients in the future.   Richard A. Felecia Shelling, MD, [facility] 0/01/4960, 1:64 PM Certified in Neurology, Clinical Neurophysiology, Sleep Medicine, Pain Medicine and Neuroimaging  [facility] [address] [place], [place] [address] [phone]

## 2018-12-25 NOTE — Telephone Encounter (Signed)
Placed JCV lab in [facility] lock box for routine lab pick up.  

## 2018-12-28 LAB — HEPATITIS B SURFACE ANTIBODY,QUALITATIVE: Hep B Surface Ab, Qual: NONREACTIVE

## 2018-12-28 LAB — QUANTIFERON-TB GOLD PLUS
QUANTIFERON NIL VALUE: 0.02 [IU]/mL
QuantiFERON Mitogen Value: >10 IU/mL
QuantiFERON TB1 Ag Value: 0.02 IU/mL
QuantiFERON TB2 Ag Value: 0.02 IU/mL
QuantiFERON-TB Gold Plus: NEGATIVE

## 2018-12-28 LAB — HEPATITIS B SURFACE ANTIGEN: Hepatitis B Surface Ag: NEGATIVE

## 2018-12-28 LAB — HEPATITIS B CORE ANTIBODY, TOTAL: Hep B Core Total Ab: NEGATIVE

## 2018-12-31 ENCOUNTER — Telehealth: Payer: Self-pay | Admitting: Neurology

## 2018-12-31 NOTE — Telephone Encounter (Signed)
Pt states she has been waiting now over a week for the lab results.  Pt is asking for a call to discuss

## 2018-12-31 NOTE — Telephone Encounter (Signed)
JCV ab negative, titer: 0.15. Drawn on 12/25/18

## 2018-12-31 NOTE — Telephone Encounter (Signed)
I spoke to Beda about the blood results.  She is JCV antibody negative.  Therefore, she can go on either medications.  We will send in the Tysabri service request form and hopefully we will get her preauthorized quickly and infused

## 2018-12-31 NOTE — Telephone Encounter (Signed)
Dr. Felecia Shelling- do you have her lab results? I am happy to call her back

## 2019-01-01 ENCOUNTER — Encounter: Payer: Self-pay | Admitting: *Deleted

## 2019-01-05 ENCOUNTER — Telehealth: Payer: Self-pay | Admitting: Neurology

## 2019-01-05 NOTE — Telephone Encounter (Signed)
pt has called for the intrafusion suite, call transferred °

## 2019-01-08 ENCOUNTER — Telehealth: Payer: Self-pay | Admitting: Neurology

## 2019-01-08 NOTE — Telephone Encounter (Signed)
Joyce Copa RN Case Manager from [place] , wanting to speak with someone regarding Pysabri  CB# [phone]

## 2019-01-08 NOTE — Telephone Encounter (Signed)
I printed message and gave to [facility] for them to return their call about Tysabri infusion.

## 2019-01-08 NOTE — Telephone Encounter (Signed)
Joyce Copa RN Case Manager from [place] , wanting to speak with someone regarding Pysabri  CB# [phone]

## 2019-01-09 NOTE — Telephone Encounter (Signed)
Cheryl/[employer] [phone] advised [employer] has not rec'd start form for tysabri. Pt has been waiting 3 weeks now and is very anxious. Please call to advise asap.

## 2019-01-12 NOTE — Telephone Encounter (Signed)
Her Tysabri start form was faxed and confirmed on 01/01/2019 and a copy was provided to [facility].  The start form has been re-faxed and confirmed again today with a request for them to process quickly.  I also returned the call to [place] at [facility].  She was out of the office and I had to speak with her colleague, Azzie Roup (his direct [phone]).  Left message that the original start form was faxed on 01/01/2019 and that it has been re-faxed again today.  Provided our number to call back with any further questions.

## 2019-01-12 NOTE — Telephone Encounter (Signed)
I spoke with [employer] today to see if a PA for Tysabri has been started or needs to be done. I was told that no PA is needed, a test claim came back approved. Tysabri must come from a SP, not B&B. I spoke with Barnett Applebaum in the infusion suite and gave her this information, asked where [facility] is in the process of getting pt. sched. for Tysabri, and was told [facility] was working on a predetermination on 01/09/19; still waiting on this to be completed. I have explained that pt. is frustrated with the process and I need to know exactly what is left to be done before she can be scheduled, so I can give the patient an accurate timeframe. Pt. sts. [employer] case manager called to check on her and told her that [facility] submitted request for determination on 01/09/19. Sts. [employer] RN also told her Fritzi Mandes is approved; has to come from [place]. SRF was submitted to [facility] and faxed to [facility] on 01/01/19. I let Gwenda know that I hope to have an answer for her today, but since [place] Charity fundraiser) is not in the office it may be tomorrow, but I will try to have an answer for her by lunch time  tomorrow. She is agreeable with this/fim

## 2019-01-12 NOTE — Telephone Encounter (Addendum)
I have spoken with the patient.  She is concerned about starting Tysabri.  We have requested Barnett Applebaum in [facility] to confirm that she is cleared to start Tysabri on 01/13/2019 as planned.

## 2019-01-12 NOTE — Telephone Encounter (Signed)
I have spoken to Barnett Applebaum in [place] who has also spoken to [place] (infusion site Freight forwarder).  Sharmon Leyden said she scheduled the appointment in anticipation that her Tysabri would be approved by that date.  Unfortunately, they are still working with [facility] on the approval and will need to reschedule her.

## 2019-01-12 NOTE — Telephone Encounter (Signed)
Pt said she has spoken with [employer], she is all clear. She has spoken with Sharmon Leyden, she is on the schedule for tomorrow. She is very teary-eye. She said all parties are saying it is someone else. Please call to speak with her at [phone].

## 2019-01-13 ENCOUNTER — Telehealth: Payer: Self-pay | Admitting: *Deleted

## 2019-01-13 NOTE — Telephone Encounter (Signed)
Fax received from [facility]. Pt. has been enrolled in their $0 copay program for Tysabri. Acct. # [id number].  CVV# 255 Exp. date: 05/05/2023 For questions, call [phone] M-F 8a-8p./fim

## 2019-01-13 NOTE — Telephone Encounter (Signed)
Spoke with Sharmon Leyden ([facility] this am). Pt's insurance is requiring Tysabri come from a SP, [facility] is trying to get B&[facility] approved. I asked Sharmon Leyden to call pt. to explain this and she says she will.  I spoke with Phiona and told her to expect Londra's call/fim

## 2019-01-14 ENCOUNTER — Encounter: Payer: Self-pay | Admitting: *Deleted

## 2019-01-14 DIAGNOSIS — G35 Multiple sclerosis: Secondary | ICD-10-CM | POA: Diagnosis not present

## 2019-01-16 DIAGNOSIS — E559 Vitamin D deficiency, unspecified: Secondary | ICD-10-CM | POA: Diagnosis not present

## 2019-01-16 DIAGNOSIS — Z0000 Encounter for general adult medical examination without abnormal findings: Secondary | ICD-10-CM | POA: Diagnosis not present

## 2019-01-16 DIAGNOSIS — F5101 Primary insomnia: Secondary | ICD-10-CM | POA: Diagnosis not present

## 2019-01-20 DIAGNOSIS — Z13220 Encounter for screening for lipoid disorders: Secondary | ICD-10-CM | POA: Diagnosis not present

## 2019-01-20 DIAGNOSIS — Z0000 Encounter for general adult medical examination without abnormal findings: Secondary | ICD-10-CM | POA: Diagnosis not present

## 2019-01-26 ENCOUNTER — Telehealth: Payer: Self-pay | Admitting: Neurology

## 2019-01-26 NOTE — Telephone Encounter (Signed)
Patient needs forms filled out buy her doctor . The form is a medical release to work Form.  Please call her [phone] .

## 2019-01-28 ENCOUNTER — Telehealth: Payer: Self-pay | Admitting: *Deleted

## 2019-01-28 NOTE — Telephone Encounter (Signed)
Spoke to pt and she had first tysabri infusion on 01-14-19.Marland Kitchen  She is doing well and wanting to return to work. From last ofv note 12-25-18 (out 5 wks).  02-02-19 Monday is ok for pt to return to work.

## 2019-01-28 NOTE — Telephone Encounter (Signed)
Pt returned call

## 2019-01-28 NOTE — Telephone Encounter (Signed)
Done email to pt today.

## 2019-01-28 NOTE — Telephone Encounter (Signed)
LMVM for pt to return call , tried to fax x 2 failed.  Called Dr. Marian Sorrow office and they are closed until 02-03-19.

## 2019-01-28 NOTE — Telephone Encounter (Signed)
I called pt and will scan to email and then to her [email or web address]. (sent to [facility] in [place]).

## 2019-01-30 NOTE — Telephone Encounter (Signed)
This was done.  See other note.

## 2019-02-12 ENCOUNTER — Telehealth: Payer: Self-pay | Admitting: Neurology

## 2019-02-12 DIAGNOSIS — G35 Multiple sclerosis: Secondary | ICD-10-CM | POA: Diagnosis not present

## 2019-02-12 NOTE — Telephone Encounter (Signed)
Since a couple days ago, she has noted a Lhermitte sign several times a day when she flexes her neck.  Of note, she does have at least 4 foci within the cervical spine though none of them enhance on her February 13 MRI.  We discussed that even old lesions may be associated with this symptom.  She has noted that since her Tysabri this morning she has not had any more episodes.  She will let us know if she notes new symptoms.

## 2019-03-02 ENCOUNTER — Telehealth: Payer: Self-pay | Admitting: Neurology

## 2019-03-02 NOTE — Telephone Encounter (Signed)
:   Pt has called for the infusion suite, call connected.

## 2019-03-16 DIAGNOSIS — G35 Multiple sclerosis: Secondary | ICD-10-CM | POA: Diagnosis not present

## 2019-04-07 ENCOUNTER — Telehealth: Payer: Self-pay | Admitting: Neurology

## 2019-04-07 NOTE — Telephone Encounter (Signed)
04-07-19 Pt called and gave verbal consent to file insurance for [email or web address] vv E-mail confirmed as:[email or web address]  Pt understands that although there may be some limitations with this type of visit, we will take all precautions to reduce any security or privacy concerns.  Pt understands that this will be treated like an in office visit and we will file with pt's insurance, and there may be a patient responsible charge related to this service. *email being sent*

## 2019-04-08 NOTE — Telephone Encounter (Signed)
Called, LVM for pt to call again

## 2019-04-08 NOTE — Telephone Encounter (Signed)
Called, LVM for pt to call back today so I can update med list, pharmacy, allergies on file before VV tomorrow

## 2019-04-09 ENCOUNTER — Other Ambulatory Visit: Payer: Self-pay

## 2019-04-09 ENCOUNTER — Encounter: Payer: Self-pay | Admitting: Neurology

## 2019-04-09 ENCOUNTER — Ambulatory Visit (INDEPENDENT_AMBULATORY_CARE_PROVIDER_SITE_OTHER): Payer: BC Managed Care – PPO | Admitting: Neurology

## 2019-04-09 DIAGNOSIS — E559 Vitamin D deficiency, unspecified: Secondary | ICD-10-CM

## 2019-04-09 DIAGNOSIS — G35 Multiple sclerosis: Secondary | ICD-10-CM | POA: Diagnosis not present

## 2019-04-09 DIAGNOSIS — Z79899 Other long term (current) drug therapy: Secondary | ICD-10-CM

## 2019-04-09 DIAGNOSIS — G054 Myelitis in diseases classified elsewhere: Secondary | ICD-10-CM

## 2019-04-09 DIAGNOSIS — R200 Anesthesia of skin: Secondary | ICD-10-CM | POA: Diagnosis not present

## 2019-04-09 NOTE — Progress Notes (Signed)
[facility]  PATIENT: Chloe Pittman DOB: 15-May-1986  REFERRING DOCTOR OR PCP: Dineen Kid, MD SOURCE: Patient, emergency room/hospital notes, imaging and lab reports, multiple MRI images personally reviewed on PACS.  _________________________________   HISTORICAL  CHIEF COMPLAINT:  Chief Complaint  Patient presents with  . Multiple Sclerosis    HISTORY OF PRESENT ILLNESS:  Chloe Pittman is a 33 y.o. woman with multiple sclerosis.   Update 04/09/19: Virtual Visit via Video Note I connected with Chloe Pittman  on 04/09/19 at  8:30 AM EDT by a video enabled telemedicine application and verified that I am speaking with the correct person.  I discussed the limitations of evaluation and management by telemedicine and the availability of in person appointments. The patient expressed understanding and agreed to proceed.  History of Present Illness: She started Tysabri in March (has had 3) and next infusion is June 16.   She is tolerating it well.  She had right arm and bilateral left leg numbness (right started a few days before the left) at the onset of her MS likely representing to spinal exacerbations occurring a few days apart.    She still has numbness in the palms of her hands.  She also has a numb sensation in the lower back/sacral area.  This numbness seems to increase if she walks more.     Her gait is doing well.  She has hit the doorframe a few times but does not feel off balanced.    She has returned to work.  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   Vision is fine.    She notes a little fatigue that is better if she sits down a little bit.   She is better in a few minutes.   She sleeps well.   She has no difficulty with mood or cognition.    Observations/Objective: She is a well-developed well-nourished woman in no acute distress.  The head is normocephalic and atraumatic.  Sclera are  anicteric.  Visible skin appears normal.  The neck has a good range of motion.  Pharynx and tongue have normal appearance.  She is alert and fully oriented with fluent speech and good attention, knowledge and memory.  Extraocular muscles are intact.  Facial strength is normal.  Palatal elevation and tongue protrusion are midline.  She appears to have normal strength in the arms.  Rapid alternating movements and finger-nose-finger are performed well.   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  The changes in sensation are more likely to represent evolution of her spinal plaques rather than a new exacerbation.  Continue Tysabri.  We need to check an MRI of the brain and cervical spine later in the summer to determine if there is any subclinical progression.  If this is occurring we will need to consider a switch to a different medication, probably Ocrevus. 2.   Stay active and exercise as tolerated. 3.   Vitamin D supplementation 4.   Return to see me in 4 months but sooner if there are new or worsening neurologic symptoms.  Follow Up Instructions: I discussed the assessment and treatment plan with the patient. The patient was provided an opportunity to ask questions and all were answered.  The patient agreed with the plan and demonstrated an understanding of the instructions.    The patient was advised to call back or seek an in-person evaluation if the symptoms worsen or if the condition fails to improve as anticipated.  I provided 30 minutes of non-face-to-face time during this encounter. ____________________________________  FROM INITIAL CONSULT 12/20/2018 She is a 33 year old woman with numbness that developed in the right side eight days ago while at work.    She did not note much clumsiness initially.  She  works as a Copywriter, advertising.  She worked the rest of the day .   Symptoms were worse the next day and even worse on Thursday when she had intense numbness and also right arm clumsiness.    The numbness started to involve the left leg on Wednesday, more so Thursday afternoon.   She noted more numbness while in the hospital, especially from the breast down.    MRI's were c/w MS with several enhancing brain lesions and several non-enhancing cervical spine lesions but no definite thoracic spine lesions.  She received 5 days of IV Solumedrol.        Currently, she continues to note abnormal sensation in the right arm now feeling like the arm is bruised or punched.  She has an intense numbness below the breasts, more on the right.  She also notes numbness in both legs.  There is clumsiness on the right side, arm worse than leg.  She notes mild weakness in the right arm and minimal weakness of the right leg.  None of her symptoms have significantly improved compared to the first day of her steroids a week ago.  She denied any neurologic symptoms such as visual change, numbness, weakness or clumsiness.  However, she has had more fatigue over the past year.    She had had a couple tick bites and had a negative Lyme test last year  She has no FH of MS.  Her sister has psoriasis.  I personally reviewed the MRIs of the brain, cervical, thoracic and lumbar spine performed 12/18/2018.  The MRI of the brain shows multiple T2/flair hyperintense foci, many in the periventricular and juxtacortical white matter.  There are also a couple foci in the left cerebellar hemisphere and left middle cerebellar peduncle.  About 3 foci enhanced after contrast.  The MRI of the cervical spine shows multiple lesions, to the right adjacent to C2-C3, dorsally towards the right adjacent to C4, centrally, little more to the left adjacent to C6.  The MRI of the thoracic spine did not show any significant findings.   The lumbar spine was  essentially normal.  I reviewed the blood work she had in the hospital.  Her white blood cell counts were slightly low.  Vitamin D was 28.9, HIV was negative.  REVIEW OF SYSTEMS: Constitutional: No fevers, chills, sweats, or change in appetite.  She has fatigue Eyes: No visual changes, double vision, eye pain Ear, nose and throat: No hearing loss, ear pain, nasal congestion, sore throat Cardiovascular: No chest pain, palpitations Respiratory: No shortness of breath at rest or with exertion.   No wheezes GastrointestinaI: No nausea, vomiting, diarrhea, abdominal pain, fecal incontinence Genitourinary: No dysuria, urinary retention or frequency.  No nocturia. Musculoskeletal: No neck pain, back pain Integumentary: No rash, pruritus, skin lesions Neurological: as above Psychiatric: No depression at this time.  No anxiety Endocrine: No palpitations, diaphoresis, change in appetite, change in weigh or increased thirst Hematologic/Lymphatic: No anemia,  purpura, petechiae. Allergic/Immunologic: No itchy/runny eyes, nasal congestion, recent allergic reactions, rashes  ALLERGIES: Allergies  Allergen Reactions  . Sulfa Antibiotics Nausea Only    HOME MEDICATIONS:  Current Outpatient Medications:  .  natalizumab (TYSABRI) 300 MG/15ML injection, Inject 300 mg into the vein every 28 (twenty-eight) days., Disp: , Rfl:  .  predniSONE (DELTASONE) 50 MG tablet, 10 po x 3 day (500 g) then 8 po x 1d then 6 po x 1d, then 4 po x 1d, then 2 po x1d, then 1 po x 1d, then 1/2 po x 1d.   HIGH DOSE TAPER FOR MS EXACERBATION., Disp: 52 tablet, Rfl: 0 .  Vitamin D, Ergocalciferol, (DRISDOL) 1.25 MG (50000 UT) CAPS capsule, Take 1 capsule (50,000 Units total) by mouth every 7 (seven) days., Disp: 13 capsule, Rfl: 0  PAST MEDICAL HISTORY: Past Medical History:  Diagnosis Date  . Hx of varicella   . Medical history non-contributory     PAST SURGICAL HISTORY: Past Surgical History:  Procedure Laterality  Date  . FACIAL COSMETIC SURGERY     dog bite  . HEMORRHOID SURGERY N/A 05/14/2018   Procedure: HEMORRHOIDECTOMY;  Surgeon: Michael Boston, MD;  Location: [facility];  Service: General;  Laterality: N/A;  . INNER EAR SURGERY    . PEXY N/A 05/14/2018   Procedure: HEMORRHOIDAL LIGATION/PEXY;  Surgeon: Michael Boston, MD;  Location: [facility];  Service: General;  Laterality: N/A;  . RECTAL EXAM UNDER ANESTHESIA N/A 05/14/2018   Procedure: ANORECTAL EXAM UNDER ANESTHESIA;  Surgeon: Michael Boston, MD;  Location: [facility];  Service: General;  Laterality: N/A;  . TUBES IN EARS      FAMILY HISTORY: Family History  Problem Relation Age of Onset  . Cancer Father        TONSIL   . Hypertension Father   . Diabetes Father   . Cancer Paternal Aunt        SKIN  . Heart disease Maternal Grandmother   . Cancer Paternal Grandmother        LUNG AND LIVER  . Stroke Paternal Grandmother   . Diabetes Mother     SOCIAL HISTORY:  Social History   Socioeconomic History  . Marital status: Married    Spouse name: Not on file  . Number of children: 1  . Years of education: Associates degree  . Highest education level: Not on file  Occupational History  . Occupation: [employer]  Social Needs  . Financial resource strain: Not on file  . Food insecurity:    Worry: Not on file    Inability: Not on file  . Transportation needs:    Medical: Not on file    Non-medical: Not on file  Tobacco Use  . Smoking status: Former Research scientist (life sciences)  . Smokeless tobacco: Never Used  . Tobacco comment: Last smoked 5-19, just one cigarette  Substance and Sexual Activity  . Alcohol use: Yes    Comment: SOCIAL - WEEK ENDS   . Drug use: No  . Sexual activity: Yes    Birth control/protection: None  Lifestyle  . Physical activity:    Days per week: Not on file    Minutes per session: Not on file  . Stress: Not on file  Relationships  . Social connections:    Talks on phone: Not on file    Gets together: Not on file     Attends religious service: Not on file    Active member of club or organization: Not on file  Attends meetings of clubs or organizations: Not on file    Relationship status: Not on file  . Intimate partner violence:    Fear of current or ex partner: Not on file    Emotionally abused: Not on file    Physically abused: Not on file    Forced sexual activity: Not on file  Other Topics Concern  . Not on file  Social History Narrative   Lives with husband    Caffeine use: 2 per day   Right handed      PHYSICAL EXAM  There were no vitals filed for this visit.  There is no height or weight on file to calculate BMI.   General: The patient is well-developed and well-nourished and in no acute distress  Eyes:  Funduscopic exam shows normal optic discs and retinal vessels.  Neck: The neck is supple, no carotid bruits are noted.  The neck is nontender.  Cardiovascular: The heart has a regular rate and rhythm with a normal S1 and S2. There were no murmurs, gallops or rubs. Lungs are clear to auscultation.  Skin: Extremities are without significant edema.  Musculoskeletal:  Back is nontender  Neurologic Exam  Mental status: The patient is alert and oriented x 3 at the time of the examination. The patient has apparent normal recent and remote memory, with an apparently normal attention span and concentration ability.   Speech is normal.  Cranial nerves: Extraocular movements are full. Pupils are equal, round, and reactive to light and accomodation.  Color vision is normal.  Facial symmetry is present. There is good facial sensation to soft touch bilaterally.Facial strength is normal.  Trapezius and sternocleidomastoid strength is normal. No dysarthria is noted.  The tongue is midline, and the patient has symmetric elevation of the soft palate. No obvious hearing deficits are noted.  Motor:  Muscle bulk is normal.   Tone is normal. Strength is 4+/5 in the right arm (except 5/5 deltoid) and  5 / 5 in in the left arm and both legs  Sensory: She reports slightly altered sensation to touch in the right arm relative to the left.  Sensation in the trunk appear to be symmetric.  She has reduced vibration sensation on the right, leg more asymmetric than the arm.  Coordination: Cerebellar testing reveals reduced right finger-nose-finger and bilateral heel-to-shin  Gait and station: Station is normal.   The gait is wide with an unstable turn.  She cannot do a tandem walk well. Romberg is borderline positive.   Reflexes: Deep tendon reflexes are normal in the arms but increased in the legs, right greater than left.  There is no ankle clonus.  Plantar responses are normal.      Kimberly Nieland A. Felecia Shelling, MD, [facility] 0/07/3234, 5:73 AM Certified in Neurology, Clinical Neurophysiology, Sleep Medicine, Pain Medicine and Neuroimaging  [facility] [address] [place], [place] [address] [phone]

## 2019-04-14 ENCOUNTER — Telehealth: Payer: Self-pay | Admitting: Neurology

## 2019-04-14 NOTE — Telephone Encounter (Signed)
Scheduled in august and do auth then lvm for pt to call me back about scheduling mri

## 2019-04-15 NOTE — Telephone Encounter (Signed)
Spoke to the patient she is scheduled for 06/16/19 at [facility].

## 2019-04-19 DIAGNOSIS — L989 Disorder of the skin and subcutaneous tissue, unspecified: Secondary | ICD-10-CM | POA: Diagnosis not present

## 2019-04-19 DIAGNOSIS — S01511A Laceration without foreign body of lip, initial encounter: Secondary | ICD-10-CM | POA: Diagnosis not present

## 2019-04-19 DIAGNOSIS — S0993XA Unspecified injury of face, initial encounter: Secondary | ICD-10-CM | POA: Diagnosis not present

## 2019-04-21 DIAGNOSIS — G35 Multiple sclerosis: Secondary | ICD-10-CM | POA: Diagnosis not present

## 2019-04-22 DIAGNOSIS — Z01419 Encounter for gynecological examination (general) (routine) without abnormal findings: Secondary | ICD-10-CM | POA: Diagnosis not present

## 2019-04-22 DIAGNOSIS — Z6823 Body mass index (BMI) 23.0-23.9, adult: Secondary | ICD-10-CM | POA: Diagnosis not present

## 2019-04-22 DIAGNOSIS — Z803 Family history of malignant neoplasm of breast: Secondary | ICD-10-CM | POA: Diagnosis not present

## 2019-04-23 ENCOUNTER — Other Ambulatory Visit: Payer: Self-pay | Admitting: Obstetrics and Gynecology

## 2019-04-23 DIAGNOSIS — R928 Other abnormal and inconclusive findings on diagnostic imaging of breast: Secondary | ICD-10-CM

## 2019-05-01 ENCOUNTER — Ambulatory Visit: Payer: BLUE CROSS/BLUE SHIELD

## 2019-05-01 ENCOUNTER — Other Ambulatory Visit: Payer: Self-pay

## 2019-05-01 ENCOUNTER — Ambulatory Visit
Admission: RE | Admit: 2019-05-01 | Discharge: 2019-05-01 | Disposition: A | Discharge status: Home / Self Care | Payer: BC Managed Care – PPO | Source: Ambulatory Visit | Attending: Obstetrics and Gynecology | Admitting: Obstetrics and Gynecology

## 2019-05-01 DIAGNOSIS — R928 Other abnormal and inconclusive findings on diagnostic imaging of breast: Secondary | ICD-10-CM

## 2019-05-01 DIAGNOSIS — R922 Inconclusive mammogram: Secondary | ICD-10-CM | POA: Diagnosis not present

## 2019-05-15 ENCOUNTER — Ambulatory Visit (INDEPENDENT_AMBULATORY_CARE_PROVIDER_SITE_OTHER): Payer: BC Managed Care – PPO | Admitting: Plastic Surgery

## 2019-05-15 ENCOUNTER — Other Ambulatory Visit: Payer: Self-pay

## 2019-05-15 ENCOUNTER — Encounter: Payer: Self-pay | Admitting: Plastic Surgery

## 2019-05-15 VITALS — BP 110/73 | HR 84 | Temp 98.2°F | Ht 70.0 in | Wt 157.6 lb

## 2019-05-15 DIAGNOSIS — S01511A Laceration without foreign body of lip, initial encounter: Secondary | ICD-10-CM | POA: Diagnosis not present

## 2019-05-15 DIAGNOSIS — K644 Residual hemorrhoidal skin tags: Secondary | ICD-10-CM | POA: Diagnosis not present

## 2019-05-17 ENCOUNTER — Encounter: Payer: Self-pay | Admitting: Plastic Surgery

## 2019-05-17 DIAGNOSIS — S01511A Laceration without foreign body of lip, initial encounter: Secondary | ICD-10-CM | POA: Insufficient documentation

## 2019-05-17 DIAGNOSIS — K644 Residual hemorrhoidal skin tags: Secondary | ICD-10-CM | POA: Insufficient documentation

## 2019-05-17 NOTE — Progress Notes (Addendum)
[name], female    DOB: 31-Dec-1985, 33 y.o.   MRN: [id number]   Chief Complaint  Patient presents with  . Advice Only    for upper lip laceration,skin on the bottom  . Skin Problem    The patient is a 33 year old female here for evaluation of her upper lip laceration and skin tag of her anus.  A few days ago the patient had a accident when something hit her in the upper right lip.  The area was Dermabond it back together.  It seems to be healing well.  There is no sign of infection.  We will keep an eye on this.  The red roll seems to be lined up very nicely. The patient gave an extensive history of hemorrhoids that were treated by Dr. Johney Maine.  She states that he has finished his work.  She expresses some frustration of a remaining tag of skin at the anterior margin of the anus.  It does not appear infected the patient states that this is very uncomfortable that it is difficult to keep clean and that it aggravates her at a regular basis.  Her chart also indicates a diagnosis of multiple sclerosis.   Review of Systems  Constitutional: Negative for activity change and appetite change.  Eyes: Negative.   Respiratory: Negative for chest tightness.   Cardiovascular: Negative for leg swelling.  Gastrointestinal: Negative for abdominal pain.  Endocrine: Negative.   Genitourinary: Negative.   Musculoskeletal: Negative.   Hematological: Negative.   Psychiatric/Behavioral: Negative.     Past Medical History:  Diagnosis Date  . Hx of varicella   . Medical history non-contributory     Past Surgical History:  Procedure Laterality Date  . FACIAL COSMETIC SURGERY     dog bite  . HEMORRHOID SURGERY N/A 05/14/2018   Procedure: HEMORRHOIDECTOMY;  Surgeon: Michael Boston, MD;  Location: [facility];  Service: General;  Laterality: N/A;  . INNER EAR SURGERY    . PEXY N/A 05/14/2018   Procedure: HEMORRHOIDAL LIGATION/PEXY;  Surgeon: Michael Boston, MD;  Location: [facility];  Service:  General;  Laterality: N/A;  . RECTAL EXAM UNDER ANESTHESIA N/A 05/14/2018   Procedure: ANORECTAL EXAM UNDER ANESTHESIA;  Surgeon: Michael Boston, MD;  Location: [facility];  Service: General;  Laterality: N/A;  . TUBES IN EARS        Current Outpatient Medications:  .  etonogestrel-ethinyl estradiol (NUVARING) 0.12-0.015 MG/24HR vaginal ring, etonogestrel 0.12 mg-ethinyl estradiol 0.015 mg/24 hr vaginal ring, Disp: , Rfl:  .  ibuprofen (ADVIL) 200 MG tablet, Take 200 mg by mouth every 6 (six) hours as needed., Disp: , Rfl:  .  natalizumab (TYSABRI) 300 MG/15ML injection, Inject 300 mg into the vein every 28 (twenty-eight) days., Disp: , Rfl:    Objective:   Vitals:   05/15/19 0837  BP: 110/73  Pulse: 84  Temp: 98.2 F (36.8 C)  SpO2: 99%    Physical Exam Vitals signs and nursing note reviewed.  Constitutional:      Appearance: Normal appearance.  HENT:     Head: Normocephalic and atraumatic.  Cardiovascular:     Rate and Rhythm: Normal rate.  Pulmonary:     Effort: Pulmonary effort is normal.  Abdominal:     General: Abdomen is flat. There is no distension.  Skin:    General: Skin is warm.     Capillary Refill: Capillary refill takes less than 2 seconds.  Neurological:     General: No  focal deficit present.     Mental Status: She is alert. Mental status is at baseline.  Psychiatric:        Mood and Affect: Mood normal.        Behavior: Behavior normal.     Assessment & Plan:     ICD-10-CM   1. External hemorrhoids with complication  M63.8   2. Lip laceration, initial encounter  S01.511A   3. Anal skin tag  K64.4     I will discussed with Dr. Johney Maine and likely with Dr. Marcello Moores.  I have concerns about operating on this area with her history.  I expressed this to her.  I stated that I would at least discuss it with her previous surgeon. Pictures were obtained of the patient (lip) and placed in the chart with the patient's or guardian's permission.  05/19/19:  I spoke with  Leighton Ruff and the current recommendation is to leave the area alone.  I have called and left a message with the patient. [place], DO

## 2019-05-25 DIAGNOSIS — G35 Multiple sclerosis: Secondary | ICD-10-CM | POA: Diagnosis not present

## 2019-05-28 ENCOUNTER — Other Ambulatory Visit: Payer: Self-pay

## 2019-05-28 DIAGNOSIS — Z20822 Contact with and (suspected) exposure to covid-19: Secondary | ICD-10-CM

## 2019-05-28 DIAGNOSIS — R6889 Other general symptoms and signs: Secondary | ICD-10-CM | POA: Diagnosis not present

## 2019-05-31 LAB — NOVEL CORONAVIRUS, NAA: SARS-CoV-2, NAA: NOT DETECTED

## 2019-06-01 NOTE — Telephone Encounter (Signed)
[employer] Auth: [id number] (exp. 06/01/19 to 06/30/19) patient is scheduled at [facility] for 06/16/19.

## 2019-06-08 DIAGNOSIS — K644 Residual hemorrhoidal skin tags: Secondary | ICD-10-CM | POA: Diagnosis not present

## 2019-06-11 ENCOUNTER — Telehealth: Payer: Self-pay

## 2019-06-11 NOTE — Telephone Encounter (Signed)
error 

## 2019-06-16 ENCOUNTER — Other Ambulatory Visit: Payer: BC Managed Care – PPO

## 2019-06-16 ENCOUNTER — Telehealth: Payer: Self-pay | Admitting: *Deleted

## 2019-06-16 ENCOUNTER — Ambulatory Visit (INDEPENDENT_AMBULATORY_CARE_PROVIDER_SITE_OTHER): Payer: BC Managed Care – PPO

## 2019-06-16 ENCOUNTER — Other Ambulatory Visit: Payer: Self-pay

## 2019-06-16 ENCOUNTER — Other Ambulatory Visit: Payer: Self-pay | Admitting: *Deleted

## 2019-06-16 DIAGNOSIS — G35 Multiple sclerosis: Secondary | ICD-10-CM

## 2019-06-16 DIAGNOSIS — Z79899 Other long term (current) drug therapy: Secondary | ICD-10-CM

## 2019-06-16 MED ORDER — GADOBENATE DIMEGLUMINE 529 MG/ML IV SOLN
15.0000 mL | Freq: Once | INTRAVENOUS | Status: AC | PRN
  Administered 2019-06-16: 15 mL via INTRAVENOUS

## 2019-06-16 NOTE — Telephone Encounter (Signed)
Faxed completed/signed Tysabri pt status report and reauth questionnaire to [facility] at [phone]. Received confirmation.   Spoke with Graylon Gunning, RN (intrafusion). Pt coming for Tysabri 06/22/19 and will complete [facility] ab lab while she is here for that. Future order placed. Pt last  04/09/19. Does not have 4 month f/u scheduled yet. I called and LVM for pt to call back and make f/u. Also advised she is due for labs when she comes for infusion on 06/22/19.  Can offer 08/10/19 at 1:30pm with Dr. Felecia Shelling if she calls back.

## 2019-06-17 ENCOUNTER — Telehealth: Payer: Self-pay | Admitting: Neurology

## 2019-06-17 NOTE — Telephone Encounter (Signed)
Pt called office back, took call from phone staff. Relayed MRI results per Dr. Felecia Shelling note. She verbalized understanding. She did not know she had infusion on 06/22/19 at 8am. She asked to r/s to 06/29/19 at 8am instead. Spoke with Gina (intrafusion) and she rescheduled pt. She will get labs done that day when she comes. She could not come today for labs and leaving to go out of town tomorrow. She will not be back until 06/22/19. She preferred to do labs when she comes for infusion. Scheduled 4 month f/u for 08/10/19 at 1:30pm with Dr. Felecia Shelling.

## 2019-06-17 NOTE — Telephone Encounter (Signed)
Called, LVM for pt to call office again. Wanting to let her know she needs labs when she comes for infusion 06/22/19, schedule office visit f/u (see below) and go over MRI results

## 2019-06-17 NOTE — Telephone Encounter (Signed)
Sharyn Lull from [employer] called stating she had several questions about some forms for the pt's natalizumab (TYSABRI) 300 MG/15ML injection. Please advise.

## 2019-06-17 NOTE — Telephone Encounter (Signed)
Received fax from touch prescribing program that pt re-authorized 06/17/19-01/14/20. Pt enrollment number: [id number]. Account: [employer]. Site auth number: [id number].

## 2019-06-17 NOTE — Telephone Encounter (Signed)
Called and spoke w/ [place]. They needed clarification on "F" for tysabri re-auth form. Advised pt scheduled for infusion 06/22/19 and will have [facility] lab then. I updated form and faxed back to them at [phone]. Received fax confirmation.

## 2019-06-29 ENCOUNTER — Telehealth: Payer: Self-pay | Admitting: *Deleted

## 2019-06-29 DIAGNOSIS — G35 Multiple sclerosis: Secondary | ICD-10-CM | POA: Diagnosis not present

## 2019-06-29 NOTE — Telephone Encounter (Signed)
Placed [facility] lab in [facility] lock box for routine lab pick up. Results pending. 

## 2019-07-03 ENCOUNTER — Ambulatory Visit: Payer: BC Managed Care – PPO | Admitting: Plastic Surgery

## 2019-07-06 ENCOUNTER — Telehealth: Payer: Self-pay

## 2019-07-06 NOTE — Telephone Encounter (Signed)

## 2019-07-07 ENCOUNTER — Ambulatory Visit (INDEPENDENT_AMBULATORY_CARE_PROVIDER_SITE_OTHER): Payer: BC Managed Care – PPO | Admitting: Plastic Surgery

## 2019-07-07 ENCOUNTER — Other Ambulatory Visit: Payer: Self-pay

## 2019-07-07 ENCOUNTER — Encounter: Payer: Self-pay | Admitting: Plastic Surgery

## 2019-07-07 VITALS — BP 113/73 | HR 87 | Temp 97.8°F | Ht 70.0 in | Wt 158.0 lb

## 2019-07-07 DIAGNOSIS — K644 Residual hemorrhoidal skin tags: Secondary | ICD-10-CM | POA: Diagnosis not present

## 2019-07-07 DIAGNOSIS — S01511A Laceration without foreign body of lip, initial encounter: Secondary | ICD-10-CM | POA: Diagnosis not present

## 2019-07-07 NOTE — Progress Notes (Signed)
   Subjective:    [name], female    DOB: [birth year], 33 y.o.   MRN: [id number]  The patient is a 33 yrs old wf here for follow up on her lip laceration and perineal skin tag.  The patient sustained an upper lip laceration several months ago.  She was seen at that time and instructed to avoid sun exposure and massage the area.  The pictures show improvement.  She is pleased with the progress.  She was also seen for a perineal skin tag.  She was referred to colorectal surgeon who suggests leaving it alone from a functional stand point.  There are no additional hemorrhoids from when she had the other one repaired.      Review of Systems  Constitutional: Negative for activity change and appetite change.  Respiratory: Negative.  Negative for chest tightness and shortness of breath.   Cardiovascular: Negative for leg swelling.  Gastrointestinal: Negative for abdominal pain.  Genitourinary: Negative.   Musculoskeletal: Negative.   Psychiatric/Behavioral: Negative.        Objective:   Physical Exam Vitals signs and nursing note reviewed.  Constitutional:      Appearance: Normal appearance.  Cardiovascular:     Rate and Rhythm: Normal rate.     Pulses: Normal pulses.  Neurological:     General: No focal deficit present.     Mental Status: She is alert. Mental status is at baseline.  Psychiatric:        Mood and Affect: Mood normal.        Behavior: Behavior normal.        Assessment & Plan:     ICD-10-CM   1. Lip laceration, initial encounter  S01.511A   2. Anal skin tag  K64.4     Patient requesting referral for perineal skin tag.  I explained that I don't have experience in that area. Mederma to lip and sunblock.  Possible laser in the future if needed. Pictures were obtained of the patient and placed in the chart with the patient's or guardian's permission.

## 2019-07-23 ENCOUNTER — Telehealth: Payer: Self-pay | Admitting: *Deleted

## 2019-07-23 NOTE — Telephone Encounter (Signed)
Called, LVM for pt letting her know we received message from infusion suite that she was experiencing more cramps in her calves and feet. Wanted to get a little more info from her. Gave [employer] phone number

## 2019-07-23 NOTE — Telephone Encounter (Signed)
Received results via fax. JCV ab drawn on 06/29/19 negative, index: 0.15

## 2019-07-23 NOTE — Telephone Encounter (Signed)
I called [facility] on 07/21/19 and requested results but had not received fax yet. I called again today and spoke with Caryl Pina. She confirmed they had results. She is going to fax report to [phone]. She confirmed it went through. Advised I will call back if we do not receive.

## 2019-07-27 DIAGNOSIS — G35 Multiple sclerosis: Secondary | ICD-10-CM | POA: Diagnosis not present

## 2019-08-01 ENCOUNTER — Telehealth: Payer: Self-pay | Admitting: Neurology

## 2019-08-01 MED ORDER — GABAPENTIN 300 MG PO CAPS: ORAL_CAPSULE | ORAL | 11 refills | Status: DC

## 2019-08-01 NOTE — Telephone Encounter (Signed)
Her legs are cramping for the last 7-10 days.  Waking her up. She paged the on call at 1130pm. She has mychart, explained you can send emails which may be easier in the future if she has any issues. She called on the 17th of this month and emma called her back but patient never returned the call.   Discussed common side effects can include: dizziness, drowsiness, weakness, tired feeling, nausea, diarrhea, constipation, blurred vision, headache, dry mouth, loss of balance or coordination. This is not a complete list and I encouraged her to get the complete side effect profile from pharmacy and discuss with pharmacist if she has any questions as difficult to go through everything on the phone.

## 2019-08-10 ENCOUNTER — Encounter: Payer: Self-pay | Admitting: Neurology

## 2019-08-10 ENCOUNTER — Other Ambulatory Visit: Payer: Self-pay

## 2019-08-10 ENCOUNTER — Ambulatory Visit (INDEPENDENT_AMBULATORY_CARE_PROVIDER_SITE_OTHER): Payer: BC Managed Care – PPO | Admitting: Neurology

## 2019-08-10 VITALS — BP 90/60 | HR 69 | Temp 96.9°F | Ht 70.0 in | Wt 164.4 lb

## 2019-08-10 DIAGNOSIS — L821 Other seborrheic keratosis: Secondary | ICD-10-CM | POA: Diagnosis not present

## 2019-08-10 DIAGNOSIS — L905 Scar conditions and fibrosis of skin: Secondary | ICD-10-CM | POA: Diagnosis not present

## 2019-08-10 DIAGNOSIS — G35 Multiple sclerosis: Secondary | ICD-10-CM | POA: Diagnosis not present

## 2019-08-10 DIAGNOSIS — R260 Ataxic gait: Secondary | ICD-10-CM

## 2019-08-10 DIAGNOSIS — Z79899 Other long term (current) drug therapy: Secondary | ICD-10-CM

## 2019-08-10 DIAGNOSIS — R252 Cramp and spasm: Secondary | ICD-10-CM | POA: Diagnosis not present

## 2019-08-10 DIAGNOSIS — L308 Other specified dermatitis: Secondary | ICD-10-CM | POA: Diagnosis not present

## 2019-08-10 NOTE — Progress Notes (Signed)
[facility]  PATIENT: Chloe Pittman DOB: 05-01-1986  REFERRING DOCTOR OR PCP: Dineen Kid, MD SOURCE: Patient, emergency room/hospital notes, imaging and lab reports, multiple MRI images personally reviewed on PACS.  _________________________________   HISTORICAL  CHIEF COMPLAINT:  Chief Complaint  Patient presents with   Follow-up    RM 13, alone. Last seen 04/09/2019.    Multiple Sclerosis    On Tysabri. Last JCV drawn 06/29/19 negative, index: 0.15.    Leg/foot cramps    Taking gabapentin 300-600mg  po qhs. She took 600mg  po qhs. Has helped some. Started on this 08/08/2019.      HISTORY OF PRESENT ILLNESS:  Chloe Pittman is a 33 y.o. woman with multiple sclerosis.    UPDATE 08/10/2019: She is on Tysabri and tolerates it well.  She started Feb or March 2020.      Her last JCV Ab ws 0.15 negative 06/29/2019.  She feels stable for the most part.  She had brain and cervical spine MRI in 06/2019 and there were no new lesions.  She was having pain and spasms in her legs,L=R but often starts in right, worse at night.   She increased the gabapentin to 600 qHS.   Leg pain is better though it still wakes her up at night now and then.    She has mild fatigue.  She notes mild cognitive issues, especially word finding issues   Update 04/09/19: (video) She started Tysabri in March (has had 3) and next infusion is June 16.   She is tolerating it well.  She had right arm and bilateral left leg numbness (right started a few days before the left) at the onset of her MS likely representing to spinal exacerbations occurring a few days apart.    She still has numbness in the palms of her hands.  She also has a numb sensation in the lower back/sacral area.  This numbness seems to increase if she walks more.     Her gait is doing well.  She has hit the doorframe a few times but does not feel off balanced.    She has returned to work.  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   Vision is fine.    She notes a little fatigue that is better if she sits down a little bit.   She is better in a few minutes.   She sleeps well.   She has no difficulty with mood or cognition.   FROM INITIAL CONSULT 12/20/2018 She is a 33 year old woman with numbness that developed in the right side eight days ago while at work.    She did not note much clumsiness initially.  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   Symptoms were worse the next day and even worse on Thursday when she had intense numbness and also right arm clumsiness.    The numbness started to involve the left leg on Wednesday, more so Thursday afternoon.   She noted more numbness while in the hospital, especially from the breast down.    MRI's were c/w MS with several enhancing brain lesions and several non-enhancing cervical spine lesions but no definite thoracic spine lesions.  She received 5 days of IV Solumedrol.        Currently, she continues to note abnormal sensation in the right arm now feeling like the arm  is bruised or punched.  She has an intense numbness below the breasts, more on the right.  She also notes numbness in both legs.  There is clumsiness on the right side, arm worse than leg.  She notes mild weakness in the right arm and minimal weakness of the right leg.  None of her symptoms have significantly improved compared to the first day of her steroids a week ago.  She denied any neurologic symptoms such as visual change, numbness, weakness or clumsiness.  However, she has had more fatigue over the past year.    She had had a couple tick bites and had a negative Lyme test last year  She has no FH of MS.  Her sister has psoriasis.  I personally reviewed the MRIs of the brain, cervical, thoracic and lumbar spine performed 12/18/2018.  The MRI of the brain shows multiple T2/flair hyperintense foci, many in the periventricular and  juxtacortical white matter.  There are also a couple foci in the left cerebellar hemisphere and left middle cerebellar peduncle.  About 3 foci enhanced after contrast.  The MRI of the cervical spine shows multiple lesions, to the right adjacent to C2-C3, dorsally towards the right adjacent to C4, centrally, little more to the left adjacent to C6.  The MRI of the thoracic spine did not show any significant findings.   The lumbar spine was essentially normal.  I reviewed the blood work she had in the hospital.  Her white blood cell counts were slightly low.  Vitamin D was 28.9, HIV was negative.  REVIEW OF SYSTEMS: Constitutional: No fevers, chills, sweats, or change in appetite.  She has fatigue Eyes: No visual changes, double vision, eye pain Ear, nose and throat: No hearing loss, ear pain, nasal congestion, sore throat Cardiovascular: No chest pain, palpitations Respiratory: No shortness of breath at rest or with exertion.   No wheezes GastrointestinaI: No nausea, vomiting, diarrhea, abdominal pain, fecal incontinence Genitourinary: No dysuria, urinary retention or frequency.  No nocturia. Musculoskeletal: No neck pain, back pain Integumentary: No rash, pruritus, skin lesions Neurological: as above Psychiatric: No depression at this time.  No anxiety Endocrine: No palpitations, diaphoresis, change in appetite, change in weigh or increased thirst Hematologic/Lymphatic: No anemia, purpura, petechiae. Allergic/Immunologic: No itchy/runny eyes, nasal congestion, recent allergic reactions, rashes  ALLERGIES: Allergies  Allergen Reactions   Sulfa Antibiotics Nausea Only    HOME MEDICATIONS:  Current Outpatient Medications:    etonogestrel-ethinyl estradiol (NUVARING) 0.12-0.015 MG/24HR vaginal ring, etonogestrel 0.12 mg-ethinyl estradiol 0.015 mg/24 hr vaginal ring, Disp: , Rfl:    gabapentin (NEURONTIN) 300 MG capsule, Start with 300mg (1 cap) at bedside. If needed can take 600mg (2caps)  at bedtime., Disp: 60 capsule, Rfl: 11   ibuprofen (ADVIL) 200 MG tablet, Take 200 mg by mouth every 6 (six) hours as needed., Disp: , Rfl:    natalizumab (TYSABRI) 300 MG/15ML injection, Inject 300 mg into the vein every 28 (twenty-eight) days., Disp: , Rfl:   PAST MEDICAL HISTORY: Past Medical History:  Diagnosis Date   Hx of varicella    Medical history non-contributory     PAST SURGICAL HISTORY: Past Surgical History:  Procedure Laterality Date   FACIAL COSMETIC SURGERY     dog bite   HEMORRHOID SURGERY N/A 05/14/2018   Procedure: HEMORRHOIDECTOMY;  Surgeon: Michael Boston, MD;  Location: [facility];  Service: General;  Laterality: N/A;   INNER EAR SURGERY     PEXY N/A 05/14/2018   Procedure: HEMORRHOIDAL LIGATION/PEXY;  Surgeon: Johney Maine,  Remo Lipps, MD;  Location: [facility];  Service: General;  Laterality: N/A;   RECTAL EXAM UNDER ANESTHESIA N/A 05/14/2018   Procedure: ANORECTAL EXAM UNDER ANESTHESIA;  Surgeon: Michael Boston, MD;  Location: [facility];  Service: General;  Laterality: N/A;   TUBES IN EARS      FAMILY HISTORY: Family History  Problem Relation Age of Onset   Cancer Father        TONSIL    Hypertension Father    Diabetes Father    Cancer Paternal Aunt        SKIN   Heart disease Maternal Grandmother    Cancer Paternal Grandmother        LUNG AND LIVER   Stroke Paternal Grandmother    Diabetes Mother    Breast cancer Maternal Aunt 33    SOCIAL HISTORY:  Social History   Socioeconomic History   Marital status: Married    Spouse name: Not on file   Number of children: 1   Years of education: Associates degree   Highest education level: Not on file  Occupational History   Occupation: [employer]. [employer]  Social Needs   Financial resource strain: Not on file   Food insecurity    Worry: Not on file    Inability: Not on file   Transportation needs    Medical: Not on file    Non-medical: Not on file  Tobacco Use   Smoking status:  Former Smoker   Smokeless tobacco: Never Used   Tobacco comment: Last smoked 5-19, just one cigarette  Substance and Sexual Activity   Alcohol use: Yes    Comment: SOCIAL - WEEK ENDS    Drug use: No   Sexual activity: Yes    Birth control/protection: None  Lifestyle   Physical activity    Days per week: Not on file    Minutes per session: Not on file   Stress: Not on file  Relationships   Social connections    Talks on phone: Not on file    Gets together: Not on file    Attends religious service: Not on file    Active member of club or organization: Not on file    Attends meetings of clubs or organizations: Not on file    Relationship status: Not on file   Intimate partner violence    Fear of current or ex partner: Not on file    Emotionally abused: Not on file    Physically abused: Not on file    Forced sexual activity: Not on file  Other Topics Concern   Not on file  Social History Narrative   Lives with husband    Caffeine use: 2 per day   Right handed      PHYSICAL EXAM  Vitals:   08/10/19 1341  BP: 90/60  Pulse: 69  Temp: (!) 96.9 F (36.1 C)  SpO2: 96%  Weight: 164 lb 6.4 oz (74.6 kg)  Height: 5\' 10"  (1.778 m)    Body mass index is 23.59 kg/m.   General: The patient is well-developed and well-nourished and in no acute distress  Eyes:  Funduscopic exam shows normal optic discs and retinal vessels.  Neck: The neck is supple, no carotid bruits are noted.  The neck is nontender.  Cardiovascular: The heart has a regular rate and rhythm with a normal S1 and S2. There were no murmurs, gallops or rubs. Lungs are clear to auscultation.  Skin: Extremities are without significant edema.  Musculoskeletal:  Back  is nontender  Neurologic Exam  Mental status: The patient is alert and oriented x 3 at the time of the examination. The patient has apparent normal recent and remote memory, with an apparently normal attention span and concentration  ability.   Speech is normal.  Cranial nerves: Extraocular movements are full. Pupils are equal, round, and reactive to light and accomodation.  Color vision is normal.  Facial symmetry is present. There is good facial sensation to soft touch bilaterally.Facial strength is normal.  Trapezius and sternocleidomastoid strength is normal. No dysarthria is noted.  The tongue is midline, and the patient has symmetric elevation of the soft palate. No obvious hearing deficits are noted.  Motor:  Muscle bulk is normal.   Tone is normal. Strength is now 5/5  Sensory: She reports slightly altered sensation to touch in the right arm relative to the left.  Sensation in the trunk appear to be symmetric.  She has reduced vibration sensation on the right, leg more asymmetric than the arm.  Coordination: Cerebellar testing reveals reduced right finger-nose-finger and bilateral heel-to-shin  Gait and station: Station is normal.  Her gait is mildly wide and tandem is wide. Romberg is borderline positive.   Reflexes: Deep tendon reflexes are normal in the arms but increased in the legs, right greater than left.  There is no ankle clonus.      Multiple sclerosis (Needles) - Plan: Comprehensive metabolic panel, Magnesium  Leg cramps - Plan: Comprehensive metabolic panel, Magnesium  Ataxic gait  High risk medication use   1.  Continue Tysabri.   She is JCV Ab negative 2.   Check electrolytes due to cramps. 3.   Stay active and exercise 4.   rtc 6 months or sooner if new or worsening neurologic symptoms    Fiona Coto A. Felecia Shelling, MD, [facility] [id number], [id number] PM Certified in Neurology, Clinical Neurophysiology, Sleep Medicine, Pain Medicine and Neuroimaging  [facility] [address], [place], [place] [address] [phone]

## 2019-08-12 DIAGNOSIS — L987 Excessive and redundant skin and subcutaneous tissue: Secondary | ICD-10-CM | POA: Diagnosis not present

## 2019-08-24 DIAGNOSIS — G35 Multiple sclerosis: Secondary | ICD-10-CM | POA: Diagnosis not present

## 2019-09-07 DIAGNOSIS — L858 Other specified epidermal thickening: Secondary | ICD-10-CM | POA: Diagnosis not present

## 2019-09-07 DIAGNOSIS — D2271 Melanocytic nevi of right lower limb, including hip: Secondary | ICD-10-CM | POA: Diagnosis not present

## 2019-09-07 DIAGNOSIS — D2272 Melanocytic nevi of left lower limb, including hip: Secondary | ICD-10-CM | POA: Diagnosis not present

## 2019-09-07 DIAGNOSIS — L218 Other seborrheic dermatitis: Secondary | ICD-10-CM | POA: Diagnosis not present

## 2019-09-21 DIAGNOSIS — G35 Multiple sclerosis: Secondary | ICD-10-CM | POA: Diagnosis not present

## 2019-09-22 DIAGNOSIS — K644 Residual hemorrhoidal skin tags: Secondary | ICD-10-CM | POA: Diagnosis not present

## 2019-09-22 DIAGNOSIS — L987 Excessive and redundant skin and subcutaneous tissue: Secondary | ICD-10-CM | POA: Diagnosis not present

## 2019-10-19 DIAGNOSIS — G35 Multiple sclerosis: Secondary | ICD-10-CM | POA: Diagnosis not present

## 2019-10-21 DIAGNOSIS — W461XXA Contact with contaminated hypodermic needle, initial encounter: Secondary | ICD-10-CM | POA: Diagnosis not present

## 2019-10-21 DIAGNOSIS — Z7721 Contact with and (suspected) exposure to potentially hazardous body fluids: Secondary | ICD-10-CM | POA: Diagnosis not present

## 2019-10-21 DIAGNOSIS — Z23 Encounter for immunization: Secondary | ICD-10-CM | POA: Diagnosis not present

## 2019-10-25 DIAGNOSIS — Z20828 Contact with and (suspected) exposure to other viral communicable diseases: Secondary | ICD-10-CM | POA: Diagnosis not present

## 2019-11-11 ENCOUNTER — Telehealth: Payer: Self-pay | Admitting: Neurology

## 2019-11-11 NOTE — Telephone Encounter (Signed)
Checked and pt called and spoke with phone staff and scheduled appt for tomorrow at 9am with Dr. Felecia Shelling

## 2019-11-11 NOTE — Telephone Encounter (Signed)
Called pt. She has been having vision issues that are intermittent since 11/05/2019. She woke up with a slight headache that day. Blurry vision in both eyes, worse in left eye. Looks like something is in her eye. Cannot focus. Denies exposure to anyone with covid-19. Denies any signs of infection. She is worried about PML since she is on Tysabri. Last infusion: 10/19/19.  Next one: 11/23/19.

## 2019-11-11 NOTE — Telephone Encounter (Signed)
Pt called wanting to discuss with RN a change she has had with her vision. Pt states this started the 31st of Dec. Please advise.

## 2019-11-11 NOTE — Telephone Encounter (Signed)
Per Dr. Felecia Shelling, PML unlikely as sx are intermittent and she has not been on Tysabri for long. If sx are persisting, she should be seen. I called pt and offered appt tomorrow at 9am with Dr. Felecia Shelling. She will call back to let me know. She has to move her schedule around first.

## 2019-11-12 ENCOUNTER — Ambulatory Visit (INDEPENDENT_AMBULATORY_CARE_PROVIDER_SITE_OTHER): Payer: BC Managed Care – PPO | Admitting: Neurology

## 2019-11-12 ENCOUNTER — Encounter: Payer: Self-pay | Admitting: Neurology

## 2019-11-12 ENCOUNTER — Telehealth: Payer: Self-pay | Admitting: *Deleted

## 2019-11-12 ENCOUNTER — Other Ambulatory Visit: Payer: Self-pay

## 2019-11-12 VITALS — BP 105/71 | HR 70 | Temp 97.7°F | Ht 70.0 in | Wt 167.6 lb

## 2019-11-12 DIAGNOSIS — Z79899 Other long term (current) drug therapy: Secondary | ICD-10-CM | POA: Diagnosis not present

## 2019-11-12 DIAGNOSIS — G2581 Restless legs syndrome: Secondary | ICD-10-CM

## 2019-11-12 DIAGNOSIS — G4700 Insomnia, unspecified: Secondary | ICD-10-CM

## 2019-11-12 DIAGNOSIS — G43109 Migraine with aura, not intractable, without status migrainosus: Secondary | ICD-10-CM | POA: Diagnosis not present

## 2019-11-12 DIAGNOSIS — G35 Multiple sclerosis: Secondary | ICD-10-CM | POA: Diagnosis not present

## 2019-11-12 MED ORDER — ELETRIPTAN HYDROBROMIDE 40 MG PO TABS: 40.0000 mg | ORAL_TABLET | ORAL | 11 refills | Status: DC | PRN

## 2019-11-12 NOTE — Progress Notes (Signed)
[facility]  PATIENT: Chloe Pittman DOB: Mar 25, 1986  REFERRING DOCTOR OR PCP: Dineen Kid, MD SOURCE: Patient, emergency room/hospital notes, imaging and lab reports, multiple MRI images personally reviewed on PACS.  _________________________________   HISTORICAL  CHIEF COMPLAINT:  Chief Complaint  Patient presents with  . Follow-up    RM 12, alone. Here d/t vision problems.She has been having vision issues that are intermittent since 11/05/2019. She woke up with a slight headache that day. Blurry vision in both eyes, worse in left eye. Looks like something is in her eye. Cannot focus. She had another episode this morning where she had a floater in her eye and headache but it went away. She has no hx of migrianes or any in her family.   . Multiple Sclerosis    On Tysabri. Last infusion: 10/19/19.  Next one: 11/23/19. Last JCV 06/29/19--0.15, negative.     HISTORY OF PRESENT ILLNESS:  Chloe Pittman is a 34 y.o. woman with relapsing remitting multiple sclerosis.   Update 11/12/2019: She has had visual symptoms since 11/05/2019.   She had a headache and had a squiggly line like a hair in the left visual field.  Both eyes were involved.   Visual symptoms lasted 10 minutes but the headache lasted hours.   Ibuprofen only helped a little bit.   She had nausea and mild photophobia and phonophobia.   Dipping her head and rising up increased the pain.     She had a second similar but milder episode a couple days later that also was followed by headache  Today, she had a scintillating (glittery) zig zag line in the right visual field lasting 5 minutes.    She has a mild headache now.     She is on Tysabri for her relapsing remitting MS.  She tolerates it well and has had no exacerbation.  She has sleep maintenance insomnia happening every night this week.   She takes gabapentin for RLS with benefit.   UPDATE 08/10/2019: She is on Tysabri and tolerates it well.  She started  Feb or March 2020.      Her last JCV Ab ws 0.15 negative 06/29/2019.  She feels stable for the most part.  She had brain and cervical spine MRI in 06/2019 and there were no new lesions.  She was having pain and spasms in her legs,L=R but often starts in right, worse at night.   She increased the gabapentin to 600 qHS.   Leg pain is better though it still wakes her up at night now and then.    She has mild fatigue.  She notes mild cognitive issues, especially word finding issues   Update 04/09/19: (video) She started Tysabri in March (has had 3) and next infusion is June 16.   She is tolerating it well.  She had right arm and bilateral left leg numbness (right started a few days before the left) at the onset of her MS likely representing to spinal exacerbations occurring a few days apart.    She still has numbness in the palms of her hands.  She also has a numb sensation in the lower back/sacral area.  This numbness seems to increase if she walks more.     Her gait is doing well.  She has hit the doorframe a few times but does not feel off balanced.    She has returned to work.  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   Vision is fine.    She notes a little fatigue that is better if she sits down a little bit.   She is better in a few minutes.   She sleeps well.   She has no difficulty with mood or cognition.   FROM INITIAL CONSULT 12/20/2018 She is a 34 year old woman with numbness that developed in the right side eight days ago while at work.    She did not note much clumsiness initially.  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   Symptoms were worse the next day and even worse on Thursday when she had intense numbness and also right arm clumsiness.    The numbness started to involve the left leg on Wednesday, more so Thursday afternoon.   She noted more numbness while in the hospital, especially from the breast  down.    MRI's were c/w MS with several enhancing brain lesions and several non-enhancing cervical spine lesions but no definite thoracic spine lesions.  She received 5 days of IV Solumedrol.        Currently, she continues to note abnormal sensation in the right arm now feeling like the arm is bruised or punched.  She has an intense numbness below the breasts, more on the right.  She also notes numbness in both legs.  There is clumsiness on the right side, arm worse than leg.  She notes mild weakness in the right arm and minimal weakness of the right leg.  None of her symptoms have significantly improved compared to the first day of her steroids a week ago.  She denied any neurologic symptoms such as visual change, numbness, weakness or clumsiness.  However, she has had more fatigue over the past year.    She had had a couple tick bites and had a negative Lyme test last year  She has no FH of MS.  Her sister has psoriasis.  I personally reviewed the MRIs of the brain, cervical, thoracic and lumbar spine performed 12/18/2018.  The MRI of the brain shows multiple T2/flair hyperintense foci, many in the periventricular and juxtacortical white matter.  There are also a couple foci in the left cerebellar hemisphere and left middle cerebellar peduncle.  About 3 foci enhanced after contrast.  The MRI of the cervical spine shows multiple lesions, to the right adjacent to C2-C3, dorsally towards the right adjacent to C4, centrally, little more to the left adjacent to C6.  The MRI of the thoracic spine did not show any significant findings.   The lumbar spine was essentially normal.  I reviewed the blood work she had in the hospital.  Her white blood cell counts were slightly low.  Vitamin D was 28.9, HIV was negative.  REVIEW OF SYSTEMS: Constitutional: No fevers, chills, sweats, or change in appetite.  She has fatigue Eyes: No visual changes, double vision, eye pain Ear, nose and throat: No hearing loss, ear  pain, nasal congestion, sore throat Cardiovascular: No chest pain, palpitations Respiratory: No shortness of breath at rest or with exertion.   No wheezes GastrointestinaI: No nausea, vomiting, diarrhea, abdominal pain, fecal incontinence Genitourinary: No dysuria, urinary retention or frequency.  No nocturia. Musculoskeletal: No neck pain, back pain Integumentary: No rash, pruritus, skin lesions Neurological: as above Psychiatric: No depression at this time.  No anxiety Endocrine: No palpitations, diaphoresis, change in appetite, change in weigh or increased thirst Hematologic/Lymphatic: No  anemia, purpura, petechiae. Allergic/Immunologic: No itchy/runny eyes, nasal congestion, recent allergic reactions, rashes  ALLERGIES: Allergies  Allergen Reactions  . Sulfa Antibiotics Nausea Only    HOME MEDICATIONS:  Current Outpatient Medications:  .  eletriptan (RELPAX) 40 MG tablet, Take 1 tablet (40 mg total) by mouth as needed for migraine or headache. May repeat in 2 hours if headache persists or recurs., Disp: 10 tablet, Rfl: 11 .  etonogestrel-ethinyl estradiol (NUVARING) 0.12-0.015 MG/24HR vaginal ring, etonogestrel 0.12 mg-ethinyl estradiol 0.015 mg/24 hr vaginal ring, Disp: , Rfl:  .  gabapentin (NEURONTIN) 300 MG capsule, Start with 300mg (1 cap) at bedside. If needed can take 600mg (2caps) at bedtime., Disp: 60 capsule, Rfl: 11 .  ibuprofen (ADVIL) 200 MG tablet, Take 200 mg by mouth every 6 (six) hours as needed., Disp: , Rfl:  .  natalizumab (TYSABRI) 300 MG/15ML injection, Inject 300 mg into the vein every 28 (twenty-eight) days., Disp: , Rfl:   PAST MEDICAL HISTORY: Past Medical History:  Diagnosis Date  . Hx of varicella   . Medical history non-contributory     PAST SURGICAL HISTORY: Past Surgical History:  Procedure Laterality Date  . FACIAL COSMETIC SURGERY     dog bite  . HEMORRHOID SURGERY N/A 05/14/2018   Procedure: HEMORRHOIDECTOMY;  Surgeon: Michael Boston, MD;   Location: [facility];  Service: General;  Laterality: N/A;  . INNER EAR SURGERY    . PEXY N/A 05/14/2018   Procedure: HEMORRHOIDAL LIGATION/PEXY;  Surgeon: Michael Boston, MD;  Location: [facility];  Service: General;  Laterality: N/A;  . RECTAL EXAM UNDER ANESTHESIA N/A 05/14/2018   Procedure: ANORECTAL EXAM UNDER ANESTHESIA;  Surgeon: Michael Boston, MD;  Location: [facility];  Service: General;  Laterality: N/A;  . TUBES IN EARS      FAMILY HISTORY: Family History  Problem Relation Age of Onset  . Cancer Father        TONSIL   . Hypertension Father   . Diabetes Father   . Cancer Paternal Aunt        SKIN  . Heart disease Maternal Grandmother   . Cancer Paternal Grandmother        LUNG AND LIVER  . Stroke Paternal Grandmother   . Diabetes Mother   . Breast cancer Maternal Aunt 80    SOCIAL HISTORY:  Social History   Socioeconomic History  . Marital status: Married    Spouse name: Not on file  . Number of children: 1  . Years of education: Associates degree  . Highest education level: Not on file  Occupational History  . Occupation: [employer]  Tobacco Use  . Smoking status: Former Research scientist (life sciences)  . Smokeless tobacco: Never Used  . Tobacco comment: Last smoked 5-19, just one cigarette  Substance and Sexual Activity  . Alcohol use: Yes    Comment: SOCIAL - WEEK ENDS   . Drug use: No  . Sexual activity: Yes    Birth control/protection: None  Other Topics Concern  . Not on file  Social History Narrative   Lives with husband    Caffeine use: 2 per day   Right handed    Social Determinants of Health   Financial Resource Strain:   . Difficulty of Paying Living Expenses: Not on file  Food Insecurity:   . Worried About Charity fundraiser in the Last Year: Not on file  . Ran Out of Food in the Last Year: Not on file  Transportation Needs:   . Lack of Transportation (  Medical): Not on file  . Lack of Transportation (Non-Medical): Not on file  Physical Activity:   . Days of  Exercise per Week: Not on file  . Minutes of Exercise per Session: Not on file  Stress:   . Feeling of Stress : Not on file  Social Connections:   . Frequency of Communication with Friends and Family: Not on file  . Frequency of Social Gatherings with Friends and Family: Not on file  . Attends Religious Services: Not on file  . Active Member of Clubs or Organizations: Not on file  . Attends Archivist Meetings: Not on file  . Marital Status: Not on file  Intimate Partner Violence:   . Fear of Current or Ex-Partner: Not on file  . Emotionally Abused: Not on file  . Physically Abused: Not on file  . Sexually Abused: Not on file     PHYSICAL EXAM  Vitals:   11/12/19 0856  BP: 105/71  Pulse: 70  Temp: 97.7 F (36.5 C)  Weight: 167 lb 9.6 oz (76 kg)  Height: 5\' 10"  (1.778 m)    Hearing Screening   125Hz  250Hz  500Hz  1000Hz  2000Hz  3000Hz  4000Hz  6000Hz  8000Hz   Right ear:           Left ear:             Visual Acuity Screening   Right eye Left eye Both eyes  Without correction: 20/30 20/30 20/30   With correction:        Body mass index is 24.05 kg/m.   General: The patient is well-developed and well-nourished and in no acute distress  Eyes:  Funduscopic exam shows normal optic discs and retinal vessels.  Skin: Extremities are without significant edema.  Neurologic Exam  Mental status: The patient is alert and oriented x 3 at the time of the examination. The patient has apparent normal recent and remote memory, with an apparently normal attention span and concentration ability.   Speech is normal.  Cranial nerves: Extraocular movements are full. Pupils are equal, round, and reactive to light and accomodation.  Color vision is normal.   Facial strength is normal.  Trapezius and sternocleidomastoid strength is normal. No dysarthria is noted.   . No obvious hearing deficits are noted.  Motor:  Muscle bulk is normal.   Tone is normal. Strength is now  5/5  Sensory: Sensation to touch and vibration is symmetric now  Coordination: Cerebellar testing reveals good finger-nose-finger and bilateral heel-to-shin  Gait and station: Station is normal.  Her gait is normal and tandem gait is mildly wide. Romberg is borderline positive.   Reflexes: Deep tendon reflexes are normal in the arms but increased in the legs, right greater than left.  There is no ankle clonus.      Multiple sclerosis ([place]) - Plan: Stratify JCV Antibody Test ([facility]), CBC with Differential/Platelets  High risk medication use - Plan: Stratify JCV Antibody Test ([facility]), CBC with Differential/Platelets  Migraine with aura and without status migrainosus, not intractable  Insomnia, unspecified type  Restless leg syndrome  1.  Continue Tysabri.   She is JCV Ab negative.  Recheck today 2.   Relpax for classic migraine --- if frequency does not decrease will add topamax 3.   Take gabapentin nightly to help sleep maintenance insomnia 4.   rtc 6 months or sooner if new or worsening neurologic symptoms    Jencarlo Bonadonna A. Felecia Shelling, MD, [facility] [id number], [id number] AM Certified in Neurology, Clinical Neurophysiology, Sleep Medicine, Pain  Medicine and Neuroimaging  [facility] [address] [place], [place] [address] [phone]

## 2019-11-12 NOTE — Telephone Encounter (Signed)
Placed [facility] lab in [facility] lock box for routine lab pick up. Results pending. 

## 2019-11-13 LAB — CBC WITH DIFFERENTIAL/PLATELET
Basophils Absolute: 0.1 10*3/uL (ref 0.0–0.2)
Basos: 1 %
EOS (ABSOLUTE): 0.6 10*3/uL — ABNORMAL HIGH (ref 0.0–0.4)
Eos: 7 %
Hematocrit: 38.1 % (ref 34.0–46.6)
Hemoglobin: 12.3 g/dL (ref 11.1–15.9)
Immature Grans (Abs): 0 10*3/uL (ref 0.0–0.1)
Immature Granulocytes: 0 %
Lymphocytes Absolute: 4.2 10*3/uL — ABNORMAL HIGH (ref 0.7–3.1)
Lymphs: 56 %
MCH: 30.1 pg (ref 26.6–33.0)
MCHC: 32.3 g/dL (ref 31.5–35.7)
MCV: 93 fL (ref 79–97)
Monocytes Absolute: 0.7 10*3/uL (ref 0.1–0.9)
Monocytes: 9 %
Neutrophils Absolute: 2 10*3/uL (ref 1.4–7.0)
Neutrophils: 27 %
Platelets: 261 10*3/uL (ref 150–450)
RBC: 4.09 x10E6/uL (ref 3.77–5.28)
RDW: 12 % (ref 11.7–15.4)
WBC: 7.5 10*3/uL (ref 3.4–10.8)

## 2019-11-16 ENCOUNTER — Telehealth: Payer: Self-pay | Admitting: Neurology

## 2019-11-16 NOTE — Telephone Encounter (Signed)
Pt has called for a refill on her gabapentin (NEURONTIN) 300 MG capsule [facility] [address] [place]

## 2019-11-16 NOTE — Telephone Encounter (Signed)
I called pt back. Advised a years supply was sent to [facility] in [facility] 07/2019. She can call to get rx transferred to [facility] location. She verbalized understanding.

## 2019-11-23 DIAGNOSIS — G35 Multiple sclerosis: Secondary | ICD-10-CM | POA: Diagnosis not present

## 2019-11-23 NOTE — Telephone Encounter (Signed)
JCV ab drawn on 11/12/19 indeterminate, index: 0.20. Inhibition assay: negative.

## 2019-12-14 ENCOUNTER — Telehealth: Payer: Self-pay | Admitting: *Deleted

## 2019-12-14 NOTE — Telephone Encounter (Signed)
Faxed completed/signed Tysabri pt status report and reauth questionnaire to [facility] at [phone]. Received confirmation.  

## 2019-12-21 DIAGNOSIS — G35 Multiple sclerosis: Secondary | ICD-10-CM | POA: Diagnosis not present

## 2020-01-18 ENCOUNTER — Telehealth: Payer: Self-pay | Admitting: *Deleted

## 2020-01-18 DIAGNOSIS — G35 Multiple sclerosis: Secondary | ICD-10-CM | POA: Diagnosis not present

## 2020-01-18 MED ORDER — CYCLOBENZAPRINE HCL 5 MG PO TABS: ORAL_TABLET | ORAL | 5 refills | Status: DC

## 2020-01-18 NOTE — Telephone Encounter (Signed)
Pt here for Tysabri infusion today. Reports she has difficulty staying asleep at night. Tried/failed: trazodone. Currently taking melatonin 20mg  po qhs but she is on week two of taking this and ineffective. I spoke with Dr. Felecia Shelling. He recommends trying her on flexeril 5 mg po qhs (1-2 tabs) #60, 5 refills. Pt agreeable to this. Escribed rx to [facility], [place].

## 2020-02-15 DIAGNOSIS — G35 Multiple sclerosis: Secondary | ICD-10-CM | POA: Diagnosis not present

## 2020-02-26 DIAGNOSIS — Z0000 Encounter for general adult medical examination without abnormal findings: Secondary | ICD-10-CM | POA: Diagnosis not present

## 2020-02-26 DIAGNOSIS — Z13220 Encounter for screening for lipoid disorders: Secondary | ICD-10-CM | POA: Diagnosis not present

## 2020-02-26 DIAGNOSIS — Z79899 Other long term (current) drug therapy: Secondary | ICD-10-CM | POA: Diagnosis not present

## 2020-02-26 DIAGNOSIS — E559 Vitamin D deficiency, unspecified: Secondary | ICD-10-CM | POA: Diagnosis not present

## 2020-03-02 DIAGNOSIS — F419 Anxiety disorder, unspecified: Secondary | ICD-10-CM | POA: Diagnosis not present

## 2020-03-02 DIAGNOSIS — Z0000 Encounter for general adult medical examination without abnormal findings: Secondary | ICD-10-CM | POA: Diagnosis not present

## 2020-03-14 DIAGNOSIS — G35 Multiple sclerosis: Secondary | ICD-10-CM | POA: Diagnosis not present

## 2020-03-24 DIAGNOSIS — J029 Acute pharyngitis, unspecified: Secondary | ICD-10-CM | POA: Diagnosis not present

## 2020-03-26 ENCOUNTER — Other Ambulatory Visit: Payer: Self-pay

## 2020-03-26 ENCOUNTER — Encounter (HOSPITAL_BASED_OUTPATIENT_CLINIC_OR_DEPARTMENT_OTHER): Payer: Self-pay

## 2020-03-26 ENCOUNTER — Emergency Department (HOSPITAL_BASED_OUTPATIENT_CLINIC_OR_DEPARTMENT_OTHER)
Admission: EM | Admit: 2020-03-26 | Discharge: 2020-03-26 | Disposition: A | Discharge status: Home / Self Care | Payer: BC Managed Care – PPO | Attending: Emergency Medicine | Admitting: Emergency Medicine

## 2020-03-26 DIAGNOSIS — Z87891 Personal history of nicotine dependence: Secondary | ICD-10-CM | POA: Diagnosis not present

## 2020-03-26 DIAGNOSIS — Z79899 Other long term (current) drug therapy: Secondary | ICD-10-CM | POA: Diagnosis not present

## 2020-03-26 DIAGNOSIS — R070 Pain in throat: Secondary | ICD-10-CM | POA: Diagnosis not present

## 2020-03-26 DIAGNOSIS — J039 Acute tonsillitis, unspecified: Secondary | ICD-10-CM | POA: Insufficient documentation

## 2020-03-26 DIAGNOSIS — G35 Multiple sclerosis: Secondary | ICD-10-CM | POA: Diagnosis not present

## 2020-03-26 HISTORY — DX: Multiple sclerosis, unspecified: G35.D

## 2020-03-26 HISTORY — DX: Multiple sclerosis: G35

## 2020-03-26 LAB — GROUP A STREP BY PCR: Group A Strep by PCR: NOT DETECTED

## 2020-03-26 MED ORDER — DEXAMETHASONE SODIUM PHOSPHATE 10 MG/ML IJ SOLN
10.0000 mg | Freq: Once | INTRAMUSCULAR | Status: AC
  Administered 2020-03-26: 10 mg via INTRAMUSCULAR
  Filled 2020-03-26: qty 1

## 2020-03-26 MED ORDER — KETOROLAC TROMETHAMINE 30 MG/ML IJ SOLN
30.0000 mg | Freq: Once | INTRAMUSCULAR | Status: AC
  Administered 2020-03-26: 30 mg via INTRAMUSCULAR
  Filled 2020-03-26: qty 1

## 2020-03-26 MED ORDER — DEXAMETHASONE SODIUM PHOSPHATE 10 MG/ML IJ SOLN: 10.0000 mg | Freq: Once | INTRAMUSCULAR | Status: DC

## 2020-03-26 NOTE — ED Provider Notes (Signed)
[facility] EMERGENCY DEPARTMENT Provider Note   CSN: [id number] Arrival date & time: 03/26/20  2057     History Chief Complaint  Patient presents with  . Sore Throat    Chloe Pittman is a 34 y.o. female with history of multiple sclerosis, presenting to the emergency department with complaint of sore throat that began on Wednesday. She states gradual onset occurred with intermittent low-grade fever with T-max of 100.2 F. She reports pain with swallowing though does not have any difficulty swallowing her food or liquids. She has been treating her symptoms with a daily dose of 800 mg ibuprofen as well as amoxicillin which she began Thursday evening. She has had 4 total doses of amoxicillin which was prescribed by her PCP after a virtual visit. She states symptoms persisted and she feels like she has more white patches on her tonsils today, therefore presents to the emergency department for further evaluation.  The history is provided by the patient.       Past Medical History:  Diagnosis Date  . Hx of varicella   . Medical history non-contributory   . MS (multiple sclerosis) [facility])     Patient Active Problem List   Diagnosis Date Noted  . Migraine with aura and without status migrainosus, not intractable 11/12/2019  . Insomnia 11/12/2019  . Restless leg syndrome 11/12/2019  . Lip laceration 05/17/2019  . Anal skin tag 05/17/2019  . Myelitis in diseases classified elsewhere ([place]) 04/09/2019  . High risk medication use 12/25/2018  . Numbness 12/25/2018  . Ataxic gait 12/25/2018  . Vitamin D deficiency 12/25/2018  . Multiple sclerosis ([place]) 12/18/2018  . Prolapsed internal hemorrhoids, grade 3 05/14/2018  . External hemorrhoids with complication [id number]  . Normal labor and delivery 02/14/2016  . Pregnancy 02/13/2016  . Family history of diabetes mellitus type II 11/14/2012    Past Surgical History:  Procedure Laterality Date  . FACIAL COSMETIC SURGERY     dog bite  . HEMORRHOID SURGERY N/A 05/14/2018   Procedure: HEMORRHOIDECTOMY;  Surgeon: Michael Boston, MD;  Location: [facility];  Service: General;  Laterality: N/A;  . INNER EAR SURGERY    . PEXY N/A 05/14/2018   Procedure: HEMORRHOIDAL LIGATION/PEXY;  Surgeon: Michael Boston, MD;  Location: [facility];  Service: General;  Laterality: N/A;  . RECTAL EXAM UNDER ANESTHESIA N/A 05/14/2018   Procedure: ANORECTAL EXAM UNDER ANESTHESIA;  Surgeon: Michael Boston, MD;  Location: [facility];  Service: General;  Laterality: N/A;  . TUBES IN EARS       OB History    Gravida  1   Para  1   Term  1   Preterm      AB      Living  1     SAB      TAB      Ectopic      Multiple  0   Live Births  1           Family History  Problem Relation Age of Onset  . Cancer Father        TONSIL   . Hypertension Father   . Diabetes Father   . Cancer Paternal Aunt        SKIN  . Heart disease Maternal Grandmother   . Cancer Paternal Grandmother        LUNG AND LIVER  . Stroke Paternal Grandmother   . Diabetes Mother   . Breast cancer Maternal Aunt 40    Social  History   Tobacco Use  . Smoking status: Former Research scientist (life sciences)  . Smokeless tobacco: Never Used  . Tobacco comment: Last smoked 5-19, just one cigarette  Substance Use Topics  . Alcohol use: Yes    Comment: SOCIAL - WEEK ENDS   . Drug use: No    Home Medications Prior to Admission medications   Medication Sig Start Date End Date Taking? Authorizing Provider  cyclobenzaprine (FLEXERIL) 5 MG tablet Take 1-2 tablets by mouth at bedtime 01/18/20   Sater, Nanine Means, MD  eletriptan (RELPAX) 40 MG tablet Take 1 tablet (40 mg total) by mouth as needed for migraine or headache. May repeat in 2 hours if headache persists or recurs. 11/12/19   Sater, Nanine Means, MD  etonogestrel-ethinyl estradiol (NUVARING) 0.12-0.015 MG/24HR vaginal ring etonogestrel 0.12 mg-ethinyl estradiol 0.015 mg/24 hr vaginal ring    [provider]  gabapentin (NEURONTIN)  300 MG capsule Start with 300mg (1 cap) at bedside. If needed can take 600mg (2caps) at bedtime. 08/01/19   Melvenia Beam, MD  ibuprofen (ADVIL) 200 MG tablet Take 200 mg by mouth every 6 (six) hours as needed.    [provider]  natalizumab (TYSABRI) 300 MG/15ML injection Inject 300 mg into the vein every 28 (twenty-eight) days.    [provider]    Allergies    Sulfa antibiotics  Review of Systems   Review of Systems  All other systems reviewed and are negative.   Physical Exam Updated Vital Signs BP 128/83 (BP Location: Right Arm)   Pulse (!) 105   Temp 99.5 F (37.5 C) (Oral)   Resp 19   Ht 5' 9.5" (1.765 m)   Wt 75.3 kg   LMP 03/21/2020   SpO2 97%   BMI 24.16 kg/m   Physical Exam Vitals and nursing note reviewed.  Constitutional:      General: She is not in acute distress.    Appearance: She is well-developed. She is not ill-appearing.  HENT:     Head: Normocephalic and atraumatic.     Right Ear: Tympanic membrane and ear canal normal.     Left Ear: Ear canal normal.  No middle ear effusion. Tympanic membrane is erythematous.     Mouth/Throat:     Mouth: Mucous membranes are moist.     Pharynx: Uvula midline. No uvula swelling.     Tonsils: Tonsillar exudate present. No tonsillar abscesses. 1+ on the right. 1+ on the left.     Comments: No trismus. Tolerating secretions. No drooling or tripoding. Eyes:     Conjunctiva/sclera: Conjunctivae normal.  Cardiovascular:     Rate and Rhythm: Normal rate and regular rhythm.  Pulmonary:     Effort: Pulmonary effort is normal. No respiratory distress.     Breath sounds: Normal breath sounds. No stridor.  Musculoskeletal:     Cervical back: Normal range of motion and neck supple.  Lymphadenopathy:     Cervical: Cervical adenopathy (anterior cervical) present.  Neurological:     Mental Status: She is alert.  Psychiatric:        Mood and Affect: Mood normal.        Behavior: Behavior normal.      ED Results / Procedures / Treatments   Labs (all labs ordered are listed, but only abnormal results are displayed) Labs Reviewed  GROUP A STREP BY PCR    EKG None  Radiology No results found.  Procedures Procedures (including critical care time)  Medications Ordered in ED Medications  ketorolac (  TORADOL) 30 MG/ML injection 30 mg (30 mg Intramuscular Given 03/26/20 2148)  dexamethasone (DECADRON) injection 10 mg (10 mg Intramuscular Given 03/26/20 2148)    ED Course  I have reviewed the triage vital signs and the nursing notes.  Pertinent labs & imaging results that were available during my care of the patient were reviewed by me and considered in my medical decision making (see chart for details).    MDM Rules/Calculators/A&P                       Patient presenting with sore throat and dysphagia since Wednesday with intermittent low-grade fever.  Patient has taken 4 doses of amoxicillin so far, prescribed by PCP Thursday evening.  No difficulty swallowing or breathing, no trismus.  Tolerating secretions.  Vital signs are stable here in the ED.  Exam with tonsillar exudate bilaterally though no significant swelling.  Anterior cervical adenopathy is also present.  Encourage patient continue course of antibiotic.  She is given a dose of Toradol and Decadron in the ED for symptom management.  PCP follow-up recommended.  Return precautions discussed.  Safe for discharge.  Final Clinical Impression(s) / ED Diagnoses Final diagnoses:  Tonsillitis    Rx / DC Orders ED Discharge Orders    None       Reagyn Facemire, [place] N, PA-C 03/26/20 2209    Isla Pence, MD 03/27/20 [phone]

## 2020-03-26 NOTE — Discharge Instructions (Signed)
Please read instructions below.  Continue your antibiotics as directed until gone. You can 600mg  of ibuprofen every 6 hours for sore throat or fever.  Drink plenty of water.  Use saline nasal spray for congestion. Follow up with your primary care provider if symptoms persist.  Return to the ER for inability to swallow liquids, difficulty breathing, or new or worsening symptoms.

## 2020-03-26 NOTE — ED Triage Notes (Signed)
Pt c/o sore throat, swollen lymph nodes, white patches in throat. Pt had a televisit with PCP and was placed on abx. Pt has been on abx for 2 days and symptoms are getting worse.

## 2020-04-11 DIAGNOSIS — G35 Multiple sclerosis: Secondary | ICD-10-CM | POA: Diagnosis not present

## 2020-04-12 DIAGNOSIS — K5900 Constipation, unspecified: Secondary | ICD-10-CM | POA: Diagnosis not present

## 2020-04-12 DIAGNOSIS — F419 Anxiety disorder, unspecified: Secondary | ICD-10-CM | POA: Diagnosis not present

## 2020-05-10 ENCOUNTER — Emergency Department (HOSPITAL_COMMUNITY)
Admission: EM | Admit: 2020-05-10 | Discharge: 2020-05-10 | Disposition: A | Discharge status: Home / Self Care | Payer: BC Managed Care – PPO | Attending: Emergency Medicine | Admitting: Emergency Medicine

## 2020-05-10 ENCOUNTER — Encounter (HOSPITAL_COMMUNITY): Payer: Self-pay

## 2020-05-10 ENCOUNTER — Emergency Department (HOSPITAL_COMMUNITY): Payer: BC Managed Care – PPO

## 2020-05-10 ENCOUNTER — Other Ambulatory Visit: Payer: Self-pay

## 2020-05-10 DIAGNOSIS — R110 Nausea: Secondary | ICD-10-CM | POA: Diagnosis not present

## 2020-05-10 DIAGNOSIS — R112 Nausea with vomiting, unspecified: Secondary | ICD-10-CM | POA: Diagnosis not present

## 2020-05-10 DIAGNOSIS — Z87891 Personal history of nicotine dependence: Secondary | ICD-10-CM | POA: Diagnosis not present

## 2020-05-10 DIAGNOSIS — R079 Chest pain, unspecified: Secondary | ICD-10-CM | POA: Diagnosis not present

## 2020-05-10 DIAGNOSIS — R1011 Right upper quadrant pain: Secondary | ICD-10-CM | POA: Diagnosis not present

## 2020-05-10 LAB — CBC
HCT: 37.2 % (ref 36.0–46.0)
Hemoglobin: 11.9 g/dL — ABNORMAL LOW (ref 12.0–15.0)
MCH: 30.4 pg (ref 26.0–34.0)
MCHC: 32 g/dL (ref 30.0–36.0)
MCV: 95.1 fL (ref 80.0–100.0)
Platelets: 271 10*3/uL (ref 150–400)
RBC: 3.91 MIL/uL (ref 3.87–5.11)
RDW: 13.1 % (ref 11.5–15.5)
WBC: 12 10*3/uL — ABNORMAL HIGH (ref 4.0–10.5)
nRBC: 0.3 % — ABNORMAL HIGH (ref 0.0–0.2)

## 2020-05-10 LAB — HEPATIC FUNCTION PANEL
ALT: 20 U/L (ref 0–44)
AST: 21 U/L (ref 15–41)
Albumin: 4.3 g/dL (ref 3.5–5.0)
Alkaline Phosphatase: 50 U/L (ref 38–126)
Bilirubin, Direct: 0.1 mg/dL (ref 0.0–0.2)
Indirect Bilirubin: 0.4 mg/dL (ref 0.3–0.9)
Total Bilirubin: 0.5 mg/dL (ref 0.3–1.2)
Total Protein: 7.4 g/dL (ref 6.5–8.1)

## 2020-05-10 LAB — URINALYSIS, ROUTINE W REFLEX MICROSCOPIC
Bilirubin Urine: NEGATIVE
Glucose, UA: NEGATIVE mg/dL
Hgb urine dipstick: NEGATIVE
Ketones, ur: NEGATIVE mg/dL
Leukocytes,Ua: NEGATIVE
Nitrite: NEGATIVE
Protein, ur: NEGATIVE mg/dL
Specific Gravity, Urine: 1.016 (ref 1.005–1.030)
pH: 8 (ref 5.0–8.0)

## 2020-05-10 LAB — I-STAT BETA HCG BLOOD, ED (MC, WL, AP ONLY): I-stat hCG, quantitative: <5 m[IU]/mL (ref <5)

## 2020-05-10 LAB — BASIC METABOLIC PANEL
Anion gap: 8 (ref 5–15)
BUN: 20 mg/dL (ref 6–20)
CO2: 30 mmol/L (ref 22–32)
Calcium: 9.3 mg/dL (ref 8.9–10.3)
Chloride: 104 mmol/L (ref 98–111)
Creatinine, Ser: 0.87 mg/dL (ref 0.44–1.00)
GFR calc Af Amer: >60 mL/min (ref >60)
GFR calc non Af Amer: >60 mL/min (ref >60)
Glucose, Bld: 84 mg/dL (ref 70–99)
Potassium: 4.3 mmol/L (ref 3.5–5.1)
Sodium: 142 mmol/L (ref 135–145)

## 2020-05-10 LAB — TROPONIN I (HIGH SENSITIVITY)
Troponin I (High Sensitivity): <2 ng/L (ref <18)
Troponin I (High Sensitivity): <2 ng/L (ref <18)

## 2020-05-10 MED ORDER — SODIUM CHLORIDE 0.9% FLUSH: 3.0000 mL | Freq: Once | INTRAVENOUS | Status: DC

## 2020-05-10 NOTE — ED Notes (Signed)
US at bedside

## 2020-05-10 NOTE — ED Provider Notes (Signed)
[facility] DEPT Provider Note   CSN: [id number] Arrival date & time: 05/10/20  1537     History Chief Complaint  Patient presents with  . Chest Pain  . Nausea    Chloe Pittman is a 34 y.o. female w PMHx MS, presenting to the ED with complaint of epigastric/RUQ pain that began today. She states symptoms began after eating a chicken salad for lunch today. Pain was a severe cramping pain, radiating to right shoulder. She had associated nausea. She denies fever, diarrhea, SOB, cough, urinary sx. No hx PE/DVT, no recent exogenous estrogen use, recent immobilization or surgery. No hx of abdominal surgeries.   The history is provided by the patient.       Past Medical History:  Diagnosis Date  . Hx of varicella   . Medical history non-contributory   . MS (multiple sclerosis) [facility])     Patient Active Problem List   Diagnosis Date Noted  . Migraine with aura and without status migrainosus, not intractable 11/12/2019  . Insomnia 11/12/2019  . Restless leg syndrome 11/12/2019  . Lip laceration 05/17/2019  . Anal skin tag 05/17/2019  . Myelitis in diseases classified elsewhere ([place]) 04/09/2019  . High risk medication use 12/25/2018  . Numbness 12/25/2018  . Ataxic gait 12/25/2018  . Vitamin D deficiency 12/25/2018  . Multiple sclerosis ([place]) 12/18/2018  . Prolapsed internal hemorrhoids, grade 3 05/14/2018  . External hemorrhoids with complication 08/65/7846  . Normal labor and delivery 02/14/2016  . Pregnancy 02/13/2016  . Family history of diabetes mellitus type II 11/14/2012    Past Surgical History:  Procedure Laterality Date  . FACIAL COSMETIC SURGERY     dog bite  . HEMORRHOID SURGERY N/A 05/14/2018   Procedure: HEMORRHOIDECTOMY;  Surgeon: Michael Boston, MD;  Location: [facility];  Service: General;  Laterality: N/A;  . INNER EAR SURGERY    . PEXY N/A 05/14/2018   Procedure: HEMORRHOIDAL LIGATION/PEXY;  Surgeon: Michael Boston, MD;  Location:  [facility];  Service: General;  Laterality: N/A;  . RECTAL EXAM UNDER ANESTHESIA N/A 05/14/2018   Procedure: ANORECTAL EXAM UNDER ANESTHESIA;  Surgeon: Michael Boston, MD;  Location: [facility];  Service: General;  Laterality: N/A;  . TUBES IN EARS       OB History    Gravida  1   Para  1   Term  1   Preterm      AB      Living  1     SAB      TAB      Ectopic      Multiple  0   Live Births  1           Family History  Problem Relation Age of Onset  . Cancer Father        TONSIL   . Hypertension Father   . Diabetes Father   . Cancer Paternal Aunt        SKIN  . Heart disease Maternal Grandmother   . Cancer Paternal Grandmother        LUNG AND LIVER  . Stroke Paternal Grandmother   . Diabetes Mother   . Breast cancer Maternal Aunt 95    Social History   Tobacco Use  . Smoking status: Former Research scientist (life sciences)  . Smokeless tobacco: Never Used  . Tobacco comment: Last smoked 5-19, just one cigarette  Vaping Use  . Vaping Use: Former  Substance Use Topics  . Alcohol use: Yes  Comment: SOCIAL - WEEK ENDS   . Drug use: No    Home Medications Prior to Admission medications   Medication Sig Start Date End Date Taking? Authorizing Provider  cyclobenzaprine (FLEXERIL) 5 MG tablet Take 1-2 tablets by mouth at bedtime 01/18/20   Sater, Nanine Means, MD  eletriptan (RELPAX) 40 MG tablet Take 1 tablet (40 mg total) by mouth as needed for migraine or headache. May repeat in 2 hours if headache persists or recurs. 11/12/19   Sater, Nanine Means, MD  etonogestrel-ethinyl estradiol (NUVARING) 0.12-0.015 MG/24HR vaginal ring etonogestrel 0.12 mg-ethinyl estradiol 0.015 mg/24 hr vaginal ring    [provider]  gabapentin (NEURONTIN) 300 MG capsule Start with 300mg (1 cap) at bedside. If needed can take 600mg (2caps) at bedtime. 08/01/19   Melvenia Beam, MD  ibuprofen (ADVIL) 200 MG tablet Take 200 mg by mouth every 6 (six) hours as needed.    [provider]  natalizumab  (TYSABRI) 300 MG/15ML injection Inject 300 mg into the vein every 28 (twenty-eight) days.    [provider]    Allergies    Sulfa antibiotics  Review of Systems   Review of Systems  Constitutional: Negative for fever.  Respiratory: Negative for cough and shortness of breath.   Gastrointestinal: Positive for abdominal pain, nausea and vomiting. Negative for diarrhea.  Genitourinary: Negative for dysuria.  All other systems reviewed and are negative.   Physical Exam Updated Vital Signs BP 111/67   Pulse 75   Temp 98 F (36.7 C) (Oral)   Resp (!) 22   Ht 5\' 10"  (1.778 m)   Wt 75.3 kg   LMP 04/26/2020   SpO2 99%   BMI 23.82 kg/m   Physical Exam Vitals and nursing note reviewed.  Constitutional:      General: She is not in acute distress.    Appearance: She is well-developed. She is not ill-appearing.  HENT:     Head: Normocephalic and atraumatic.  Eyes:     Conjunctiva/sclera: Conjunctivae normal.  Cardiovascular:     Rate and Rhythm: Normal rate and regular rhythm.  Pulmonary:     Effort: Pulmonary effort is normal. No respiratory distress.     Breath sounds: Normal breath sounds.  Abdominal:     General: Bowel sounds are normal.     Palpations: Abdomen is soft.     Tenderness: There is abdominal tenderness in the right upper quadrant. There is no guarding or rebound.  Skin:    General: Skin is warm.  Neurological:     Mental Status: She is alert.  Psychiatric:        Behavior: Behavior normal.     ED Results / Procedures / Treatments   Labs (all labs ordered are listed, but only abnormal results are displayed) Labs Reviewed  CBC - Abnormal; Notable for the following components:      Result Value   WBC 12.0 (*)    Hemoglobin 11.9 (*)    nRBC 0.3 (*)    All other components within normal limits  URINALYSIS, ROUTINE W REFLEX MICROSCOPIC - Abnormal; Notable for the following components:   APPearance CLOUDY (*)    Bacteria, UA RARE (*)    All  other components within normal limits  BASIC METABOLIC PANEL  HEPATIC FUNCTION PANEL  I-STAT BETA HCG BLOOD, ED ([facility], [facility], [facility] ONLY)  TROPONIN I (HIGH SENSITIVITY)  TROPONIN I (HIGH SENSITIVITY)    EKG EKG Interpretation  Date/Time:  Tuesday May 10 2020 15:54:52 EDT Ventricular  Rate:  81 PR Interval:    QRS Duration: 99 QT Interval:  367 QTC Calculation: 426 R Axis:   64 Text Interpretation: Sinus rhythm Borderline short PR interval Low voltage, precordial leads 12 Lead; Mason-Likar No old tracing to compare Confirmed by Daleen Bo [phone]) on 05/10/2020 4:39:37 PM   Radiology DG Chest 2 View  Result Date: 05/10/2020 CLINICAL DATA:  Right chest pain, nausea EXAM: CHEST - 2 VIEW COMPARISON:  None. FINDINGS: The lungs are well expanded, are symmetric, and are clear. Nodular opacity over the left apex represents a button external to the patient. No pneumothorax or pleural effusion. Cardiomediastinal silhouette unremarkable. Pulmonary vascularity is normal. No acute bone abnormality. IMPRESSION: No active cardiopulmonary disease. Electronically Signed   By: Fidela Salisbury MD   On: 05/10/2020 16:59   US Abdomen Limited RUQ  Result Date: 05/10/2020 CLINICAL DATA:  Right upper quadrant pain EXAM: ULTRASOUND ABDOMEN LIMITED RIGHT UPPER QUADRANT COMPARISON:  None. FINDINGS: Gallbladder: No gallstones or wall thickening visualized. No sonographic Murphy sign noted by sonographer. Common bile duct: Diameter: 3.5 mm Liver: No focal lesion identified. Within normal limits in parenchymal echogenicity. Portal vein is patent on color Doppler imaging with normal direction of blood flow towards the liver. Other: None. IMPRESSION: Negative right upper quadrant abdominal ultrasound Electronically Signed   By: Donavan Foil M.D.   On: 05/10/2020 19:17    Procedures Procedures (including critical care time)  Medications Ordered in ED Medications - No data to display  ED Course  I have reviewed the  triage vital signs and the nursing notes.  Pertinent labs & imaging results that were available during my care of the patient were reviewed by me and considered in my medical decision making (see chart for details).    MDM Rules/Calculators/A&P                          Patient presenting to the ED with right upper quadrant/epigastric abdominal pain began after meal today.  Associated nausea and vomiting.  Symptoms resolved upon evaluation to the ED.  She is well-appearing and in no distress.  Abdomen is soft with some slight tenderness to the right upper quadrant.  Given presentation, labs, right upper quadrant ultrasound ordered.  Cardiac work-up initiated in triage, however symptoms seem less likely cardiac in nature.  Patient is absence of risk factors, symptoms to be very atypical for ACS.  Low risk [place].  Not short of breath, satting 1 her percent on room air, not tachycardic.  Labs are reassuring.  Slight leukocytosis of 12.  Normal renal and hepatic function.  No electrolyte abnormalities.  UA is negative for blood or infection.  Troponins are negative.  Pregnancy is negative.  Right upper quadrant ultrasound is also negative.  Chest x-ray is clear.  She remains asymptomatic throughout ED stay.  No evidence of acute or emergent intra-abdominal or intrathoracic pathology today.  She is instructed of strict return precautions..  She is agreeable with plan and safe for discharge.  Discussed with Dr. Zenia Resides.  Final Clinical Impression(s) / ED Diagnoses Final diagnoses:  RUQ abdominal pain  Nausea    Rx / DC Orders ED Discharge Orders    None       Thomes Burak, [place] N, PA-C 05/10/20 2305    Lacretia Leigh, MD 05/12/20 1711

## 2020-05-10 NOTE — Discharge Instructions (Addendum)
Please read instructions below. You can drink clear liquids until your stomach feels better.  Follow up with your primary care if symptoms persist. Return to the ER for worsening abdominal pain, fever, uncontrollable vomiting, or new or concerning symptoms.

## 2020-05-10 NOTE — ED Triage Notes (Signed)
C/O right chest pain that radiates under breast.   8/10 pain  Nausea and dry heaves during triage.   Patient was diagnosed with MS last year.   Ambulatory in triage A/Ox4

## 2020-05-13 DIAGNOSIS — Z6824 Body mass index (BMI) 24.0-24.9, adult: Secondary | ICD-10-CM | POA: Diagnosis not present

## 2020-05-13 DIAGNOSIS — Z01419 Encounter for gynecological examination (general) (routine) without abnormal findings: Secondary | ICD-10-CM | POA: Diagnosis not present

## 2020-05-13 DIAGNOSIS — Z803 Family history of malignant neoplasm of breast: Secondary | ICD-10-CM | POA: Diagnosis not present

## 2020-05-13 DIAGNOSIS — Z124 Encounter for screening for malignant neoplasm of cervix: Secondary | ICD-10-CM | POA: Diagnosis not present

## 2020-05-16 ENCOUNTER — Telehealth: Payer: Self-pay | Admitting: *Deleted

## 2020-05-16 DIAGNOSIS — Z79899 Other long term (current) drug therapy: Secondary | ICD-10-CM | POA: Diagnosis not present

## 2020-05-16 DIAGNOSIS — G35 Multiple sclerosis: Secondary | ICD-10-CM | POA: Diagnosis not present

## 2020-05-16 NOTE — Addendum Note (Signed)
Addended by: Inis Sizer D on: 05/16/2020 04:01 PM   Modules accepted: Orders

## 2020-05-16 NOTE — Telephone Encounter (Signed)
Placed [facility] lab in [facility] lock box for routine lab pick up. Results pending. 

## 2020-05-17 ENCOUNTER — Encounter: Payer: Self-pay | Admitting: Neurology

## 2020-05-17 ENCOUNTER — Ambulatory Visit (INDEPENDENT_AMBULATORY_CARE_PROVIDER_SITE_OTHER): Payer: BC Managed Care – PPO | Admitting: Neurology

## 2020-05-17 VITALS — BP 109/69 | HR 87 | Ht 70.0 in | Wt 166.0 lb

## 2020-05-17 DIAGNOSIS — R260 Ataxic gait: Secondary | ICD-10-CM

## 2020-05-17 DIAGNOSIS — G35 Multiple sclerosis: Secondary | ICD-10-CM | POA: Diagnosis not present

## 2020-05-17 DIAGNOSIS — G2581 Restless legs syndrome: Secondary | ICD-10-CM

## 2020-05-17 DIAGNOSIS — R252 Cramp and spasm: Secondary | ICD-10-CM

## 2020-05-17 DIAGNOSIS — Z79899 Other long term (current) drug therapy: Secondary | ICD-10-CM

## 2020-05-17 DIAGNOSIS — F988 Other specified behavioral and emotional disorders with onset usually occurring in childhood and adolescence: Secondary | ICD-10-CM | POA: Insufficient documentation

## 2020-05-17 DIAGNOSIS — F09 Unspecified mental disorder due to known physiological condition: Secondary | ICD-10-CM

## 2020-05-17 DIAGNOSIS — J040 Acute laryngitis: Secondary | ICD-10-CM

## 2020-05-17 LAB — CBC WITH DIFFERENTIAL/PLATELET
Basophils Absolute: 0.1 10*3/uL (ref 0.0–0.2)
Basos: 1 %
EOS (ABSOLUTE): 0.7 10*3/uL — ABNORMAL HIGH (ref 0.0–0.4)
Eos: 7 %
Hematocrit: 35.1 % (ref 34.0–46.6)
Hemoglobin: 11.7 g/dL (ref 11.1–15.9)
Immature Grans (Abs): 0 10*3/uL (ref 0.0–0.1)
Immature Granulocytes: 0 %
Lymphocytes Absolute: 4.3 10*3/uL — ABNORMAL HIGH (ref 0.7–3.1)
Lymphs: 44 %
MCH: 30 pg (ref 26.6–33.0)
MCHC: 33.3 g/dL (ref 31.5–35.7)
MCV: 90 fL (ref 79–97)
Monocytes Absolute: 1.2 10*3/uL — ABNORMAL HIGH (ref 0.1–0.9)
Monocytes: 12 %
Neutrophils Absolute: 3.6 10*3/uL (ref 1.4–7.0)
Neutrophils: 36 %
Platelets: 267 10*3/uL (ref 150–450)
RBC: 3.9 x10E6/uL (ref 3.77–5.28)
RDW: 12.5 % (ref 11.7–15.4)
WBC: 9.9 10*3/uL (ref 3.4–10.8)

## 2020-05-17 MED ORDER — AMPHETAMINE-DEXTROAMPHETAMINE 10 MG PO TABS
ORAL_TABLET | ORAL | 0 refills | Status: DC
Start: 2020-05-17 — End: 2020-10-06

## 2020-05-17 NOTE — Progress Notes (Signed)
[facility]  PATIENT: Chloe Pittman DOB: 06/04/1986  REFERRING DOCTOR OR PCP: Dineen Kid, MD SOURCE: Patient, emergency room/hospital notes, imaging and lab reports, multiple MRI images personally reviewed on PACS.  _________________________________   HISTORICAL  CHIEF COMPLAINT:  Chief Complaint  Patient presents with  . Follow-up    RM 13, alone. Last seen 11/12/19. Has laryngitis that started last Monday.   . Multiple Sclerosis    On Tysabri. Last infusion 05/16/20 and next one is 06/13/20. Last JCV ab drawn 05/16/20 and it is pending.     HISTORY OF PRESENT ILLNESS:  Chloe Pittman is a 34 y.o. woman with relapsing remitting multiple sclerosis.   Update 05/17/2020: She is on Tysabri for MS and tolerates it well.   She has JCV Ab drawn earlier this week but it is not back yet.   She denies exacerbations or major new neurologic symptoms.     She does note that memory seems a little worse.   Also has made some errors with days/dates.   She does not always remember conversations.     She has trouble focusing on one voice if two are talking.   She has some word finding difficulty.     She is on gabapentin 300 mg 3 times a day.   She takes Flexeril 5 mg nightly for spasticity/cramps.   Lower dose of gabapentin was associated with more   She has had some depression, better since starting Lexapro   Wellbutrin was also prescribed but she is not taking.      She has fatigue.   She is often exhausted in the evenings and goes to bed around 8:30 pm.  She sleeps 8-9 hours per hr Fitbit.   She doe not snore  Migraine headaches have generally done a little better.  She takes Relpax when 1 occurs.  She has had laynxgitis a few days associated with URI symptoms off/on the last week, better this afternoon.  Update 11/12/2019: She has had visual symptoms since 11/05/2019.   She had a headache and had a squiggly line like a hair in the left visual field.  Both eyes were  involved.   Visual symptoms lasted 10 minutes but the headache lasted hours.   Ibuprofen only helped a little bit.   She had nausea and mild photophobia and phonophobia.   Dipping her head and rising up increased the pain.     She had a second similar but milder episode a couple days later that also was followed by headache  Today, she had a scintillating (glittery) zig zag line in the right visual field lasting 5 minutes.    She has a mild headache now.     She is on Tysabri for her relapsing remitting MS.  She tolerates it well and has had no exacerbation.  She has sleep maintenance insomnia happening every night this week.   She takes gabapentin for RLS with benefit.   UPDATE 08/10/2019: She is on Tysabri and tolerates it well.  She started Feb or March 2020.      Her last JCV Ab ws 0.15 negative 06/29/2019.  She feels stable for the most part.  She had brain and cervical spine MRI in 06/2019 and there were no new lesions.  She was having pain and spasms in her legs,L=R but often starts in right, worse at night.   She increased the gabapentin to 600 qHS.   Leg pain is better though it still wakes  her up at night now and then.    She has mild fatigue.  She notes mild cognitive issues, especially word finding issues   Update 04/09/19: (video) She started Tysabri in March (has had 3) and next infusion is June 16.   She is tolerating it well.  She had right arm and bilateral left leg numbness (right started a few days before the left) at the onset of her MS likely representing to spinal exacerbations occurring a few days apart.    She still has numbness in the palms of her hands.  She also has a numb sensation in the lower back/sacral area.  This numbness seems to increase if she walks more.     Her gait is doing well.  She has hit the doorframe a few times but does not feel off balanced.    She has returned to work.  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   Vision is fine.    She notes a little fatigue that is better if she sits down a little bit.   She is better in a few minutes.   She sleeps well.   She has no difficulty with mood or cognition.   FROM INITIAL CONSULT 12/20/2018 She is a 34 year old woman with numbness that developed in the right side eight days ago while at work.    She did not note much clumsiness initially.  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   Symptoms were worse the next day and even worse on Thursday when she had intense numbness and also right arm clumsiness.    The numbness started to involve the left leg on Wednesday, more so Thursday afternoon.   She noted more numbness while in the hospital, especially from the breast down.    MRI's were c/w MS with several enhancing brain lesions and several non-enhancing cervical spine lesions but no definite thoracic spine lesions.  She received 5 days of IV Solumedrol.        Currently, she continues to note abnormal sensation in the right arm now feeling like the arm is bruised or punched.  She has an intense numbness below the breasts, more on the right.  She also notes numbness in both legs.  There is clumsiness on the right side, arm worse than leg.  She notes mild weakness in the right arm and minimal weakness of the right leg.  None of her symptoms have significantly improved compared to the first day of her steroids a week ago.  She denied any neurologic symptoms such as visual change, numbness, weakness or clumsiness.  However, she has had more fatigue over the past year.    She had had a couple tick bites and had a negative Lyme test last year  She has no FH of MS.  Her sister has psoriasis.  I personally reviewed the MRIs of the brain, cervical, thoracic and lumbar spine performed 12/18/2018.  The MRI of the brain shows multiple T2/flair hyperintense foci, many in the periventricular and juxtacortical white  matter.  There are also a couple foci in the left cerebellar hemisphere and left middle cerebellar peduncle.  About 3 foci enhanced after contrast.  The MRI of the cervical spine shows multiple lesions, to the right adjacent to C2-C3, dorsally towards the right adjacent to C4, centrally, little more to the left adjacent to C6.  The MRI of the thoracic spine did not show any significant findings.   The lumbar spine was essentially normal.  I reviewed the blood work she had in the hospital.  Her white blood cell counts were slightly low.  Vitamin D was 28.9, HIV was negative.  REVIEW OF SYSTEMS: Constitutional: No fevers, chills, sweats, or change in appetite.  She has fatigue Eyes: No visual changes, double vision, eye pain Ear, nose and throat: No hearing loss, ear pain, nasal congestion, sore throat Cardiovascular: No chest pain, palpitations Respiratory: No shortness of breath at rest or with exertion.   No wheezes GastrointestinaI: No nausea, vomiting, diarrhea, abdominal pain, fecal incontinence Genitourinary: No dysuria, urinary retention or frequency.  No nocturia. Musculoskeletal: No neck pain, back pain Integumentary: No rash, pruritus, skin lesions Neurological: as above Psychiatric: No depression at this time.  No anxiety Endocrine: No palpitations, diaphoresis, change in appetite, change in weigh or increased thirst Hematologic/Lymphatic: No anemia, purpura, petechiae. Allergic/Immunologic: No itchy/runny eyes, nasal congestion, recent allergic reactions, rashes  ALLERGIES: Allergies  Allergen Reactions  . Sulfa Antibiotics Nausea Only    HOME MEDICATIONS:  Current Outpatient Medications:  .  cyclobenzaprine (FLEXERIL) 5 MG tablet, Take 1-2 tablets by mouth at bedtime, Disp: 60 tablet, Rfl: 5 .  eletriptan (RELPAX) 40 MG tablet, Take 1 tablet (40 mg total) by mouth as needed for migraine or headache. May repeat in 2 hours if headache persists or recurs., Disp: 10 tablet,  Rfl: 11 .  etonogestrel-ethinyl estradiol (NUVARING) 0.12-0.015 MG/24HR vaginal ring, etonogestrel 0.12 mg-ethinyl estradiol 0.015 mg/24 hr vaginal ring, Disp: , Rfl:  .  gabapentin (NEURONTIN) 300 MG capsule, Start with 300mg (1 cap) at bedside. If needed can take 600mg (2caps) at bedtime., Disp: 60 capsule, Rfl: 11 .  ibuprofen (ADVIL) 200 MG tablet, Take 200 mg by mouth every 6 (six) hours as needed., Disp: , Rfl:  .  natalizumab (TYSABRI) 300 MG/15ML injection, Inject 300 mg into the vein every 28 (twenty-eight) days., Disp: , Rfl:  .  amphetamine-dextroamphetamine (ADDERALL) 10 MG tablet, One po qAM but one po qNoon, Disp: 60 tablet, Rfl: 0  PAST MEDICAL HISTORY: Past Medical History:  Diagnosis Date  . Hx of varicella   . Medical history non-contributory   . MS (multiple sclerosis) ([place])     PAST SURGICAL HISTORY: Past Surgical History:  Procedure Laterality Date  . FACIAL COSMETIC SURGERY     dog bite  . HEMORRHOID SURGERY N/A 05/14/2018   Procedure: HEMORRHOIDECTOMY;  Surgeon: Michael Boston, MD;  Location: [facility];  Service: General;  Laterality: N/A;  . INNER EAR SURGERY    . PEXY N/A 05/14/2018   Procedure: HEMORRHOIDAL LIGATION/PEXY;  Surgeon: Michael Boston, MD;  Location: [facility];  Service: General;  Laterality: N/A;  . RECTAL EXAM UNDER ANESTHESIA N/A 05/14/2018   Procedure: ANORECTAL EXAM UNDER ANESTHESIA;  Surgeon: Michael Boston, MD;  Location: [facility];  Service: General;  Laterality: N/A;  . TUBES IN EARS      FAMILY HISTORY: Family History  Problem Relation Age of Onset  . Cancer Father        TONSIL   . Hypertension Father   . Diabetes Father   . Cancer Paternal Aunt        SKIN  . Heart disease Maternal Grandmother   . Cancer Paternal Grandmother        LUNG AND LIVER  . Stroke Paternal Grandmother   . Diabetes Mother   . Breast cancer Maternal Aunt 46  SOCIAL HISTORY:  Social History   Socioeconomic History  . Marital status: Married    Spouse name:  Not on file  . Number of children: 1  . Years of education: Associates degree  . Highest education level: Not on file  Occupational History  . Occupation: [employer]  Tobacco Use  . Smoking status: Former Research scientist (life sciences)  . Smokeless tobacco: Never Used  . Tobacco comment: Last smoked 5-19, just one cigarette  Vaping Use  . Vaping Use: Former  Substance and Sexual Activity  . Alcohol use: Yes    Comment: SOCIAL - WEEK ENDS   . Drug use: No  . Sexual activity: Yes    Birth control/protection: None  Other Topics Concern  . Not on file  Social History Narrative   Lives with husband    Caffeine use: 2 per day   Right handed    Social Determinants of Health   Financial Resource Strain:   . Difficulty of Paying Living Expenses:   Food Insecurity:   . Worried About Charity fundraiser in the Last Year:   . Arboriculturist in the Last Year:   Transportation Needs:   . Film/video editor (Medical):   Marland Kitchen Lack of Transportation (Non-Medical):   Physical Activity:   . Days of Exercise per Week:   . Minutes of Exercise per Session:   Stress:   . Feeling of Stress :   Social Connections:   . Frequency of Communication with Friends and Family:   . Frequency of Social Gatherings with Friends and Family:   . Attends Religious Services:   . Active Member of Clubs or Organizations:   . Attends Archivist Meetings:   Marland Kitchen Marital Status:   Intimate Partner Violence:   . Fear of Current or Ex-Partner:   . Emotionally Abused:   Marland Kitchen Physically Abused:   . Sexually Abused:      PHYSICAL EXAM  Vitals:   05/17/20 1541  BP: 109/69  Pulse: 87  Weight: 166 lb (75.3 kg)  Height: 5\' 10"  (1.778 m)   No exam data present   Body mass index is 23.82 kg/m.   General: The patient is well-developed and well-nourished and in no acute distress  Skin: Extremities are without rash or edema.  Neurologic Exam  Mental status: The patient is alert and oriented x 3 at the time  of the examination. The patient has apparent normal recent and remote memory, with an apparently normal attention span and concentration ability.   Speech is normal.  Cranial nerves: Extraocular movements are full. Pupils are equal, round, and reactive to light and accomodation.  Color vision is normal.   Facial strength is normal.  Trapezius and sternocleidomastoid strength is normal. No dysarthria is noted.   . No obvious hearing deficits are noted.  Motor:  Muscle bulk is normal.   Tone is normal. Strength is 5/5  Sensory: Sensation to touch and vibration is symmetric now  Coordination: Cerebellar testing reveals good finger-nose-finger and bilateral heel-to-shin  Gait and station: Station is normal.  Her gait is normal and tandem gait is mildly wide. Romberg is borderline positive  Reflexes: Deep tendon reflexes are normal in the arms but increased in the legs, right greater than left.  There is no ankle clonus.      Multiple sclerosis ([place]) - Plan: MR BRAIN W WO CONTRAST  High risk medication use  Leg cramps  Restless leg syndrome  Ataxic gait  Cognitive deficit secondary to multiple sclerosis (HCC)  Attention deficit disorder (ADD) without hyperactivity  Laryngitis   1.  Continue Tysabri.   Labs were drawn earlier this week and are pending.  If he does convert to JCV antibody negative we would need to consider a different disease modifying therapy.  Additionally will check an MRI of the brain to determine if there is any subclinical progression.  If this is occurring we would need to consider a different disease modifying therapy.   2.   Relpax for classic migraine  3.   Take gabapentin and Flexeril at night nightly to help restless legs and sleep maintenance insomnia 4.   Adderall 10 mg p.o. in the morning and at noon to help with attention deficit and MS related cognitive disturbance.  The dose can be adjusted based on effect.   5.  rtc 6 months or sooner if new or  worsening neurologic symptoms  45-minute office visit with the majority of the time spent face-to-face for history and physical, discussion/counseling and decision-making.  Additional time with record review and documentation.    Rolena Knutson A. Felecia Shelling, MD, [facility] 0/21/1173, 5:67 PM Certified in Neurology, Clinical Neurophysiology, Sleep Medicine, Pain Medicine and Neuroimaging  [facility] [address] [place], [place] [address] [phone]

## 2020-05-22 LAB — STRATIFY JCV AB (W/ INDEX) W/ RFLX
Index Value: 0.2 — ABNORMAL HIGH
Stratify JCV (TM) Ab w/Reflex Inhibition: UNDETERMINED — AB

## 2020-05-22 LAB — RFLX STRATIFY JCV (TM) AB INHIBITION: JCV Antibody by Inhibition: NEGATIVE

## 2020-05-27 ENCOUNTER — Telehealth: Payer: Self-pay | Admitting: Neurology

## 2020-05-27 MED ORDER — GABAPENTIN 300 MG PO CAPS: 900.0000 mg | ORAL_CAPSULE | Freq: Every day | ORAL | 4 refills | Status: DC

## 2020-05-27 NOTE — Telephone Encounter (Signed)
Patient called on-call physician for higher dose of gabapentin prescription, I have E prescribed gabapentin 300 mg 3 tablets every night, 90-day supply with refills

## 2020-05-30 NOTE — Telephone Encounter (Signed)
Submitted PA gabapentin on CMM. KeyLeonard Downing - PA Case ID: [id number]. Waiting on determination from [facility].

## 2020-05-31 NOTE — Telephone Encounter (Signed)
CaseId:[id number];Status:Approved;Review Type:Qty;Coverage Start Date:04/30/2020;Coverage End Date:05/30/2021;

## 2020-06-07 ENCOUNTER — Telehealth: Payer: Self-pay | Admitting: Neurology

## 2020-06-07 NOTE — Telephone Encounter (Signed)
Called pt back to further discuss. She was speaking w/ pt this am and could not get her words out. Sx are about the same as last visit. Wanting to let MD know that sx are ongoing. States her eyes are "messing up". She was trying to look at pt chart yesterday and could not see clearly. Had lines in her vision. Sounds like she had an aura to migraine. She has not been getting an headaches though. She could see in her peripheral vision but not directly in front of her. She is worried about her vision. She had MRI brain scheduled for 06/21/20. Advised I will send message to MD and call her back with recommendation.

## 2020-06-07 NOTE — Telephone Encounter (Signed)
If vision is still a problem we can try to get in this week to determine if optic neuritis  Word finding issues are common in MS, especially if tired.

## 2020-06-07 NOTE — Telephone Encounter (Signed)
Pt called stating that she is needing to speak to the RN about the trouble she has been having with speaking. Pt states she is having trouble forming her words and being able to explain what she is trying to say. Please advise.

## 2020-06-07 NOTE — Telephone Encounter (Signed)
Called pt back. Relayed Dr. Garth Bigness message. She will come in for f/u. Scheduled appt for 06/09/20 at 130pm. Advised her to check in at 100pm. She verbalized understanding.

## 2020-06-09 ENCOUNTER — Telehealth: Payer: Self-pay | Admitting: Neurology

## 2020-06-09 ENCOUNTER — Encounter: Payer: Self-pay | Admitting: Neurology

## 2020-06-09 ENCOUNTER — Ambulatory Visit (INDEPENDENT_AMBULATORY_CARE_PROVIDER_SITE_OTHER): Payer: BC Managed Care – PPO | Admitting: Neurology

## 2020-06-09 VITALS — BP 107/62 | HR 87 | Ht 70.0 in | Wt 167.2 lb

## 2020-06-09 DIAGNOSIS — G43109 Migraine with aura, not intractable, without status migrainosus: Secondary | ICD-10-CM | POA: Diagnosis not present

## 2020-06-09 DIAGNOSIS — H539 Unspecified visual disturbance: Secondary | ICD-10-CM | POA: Insufficient documentation

## 2020-06-09 DIAGNOSIS — Z79899 Other long term (current) drug therapy: Secondary | ICD-10-CM

## 2020-06-09 DIAGNOSIS — G35 Multiple sclerosis: Secondary | ICD-10-CM | POA: Diagnosis not present

## 2020-06-09 NOTE — Telephone Encounter (Signed)
[employer] Auth: [id number] (exp. 06/09/20 to 07/08/20) patient is scheduled at [facility]  for 06/21/20.

## 2020-06-09 NOTE — Progress Notes (Signed)
[facility]  PATIENT: Chloe Pittman DOB: 10-16-1986  REFERRING DOCTOR OR PCP: Dineen Kid, MD SOURCE: Patient, emergency room/hospital notes, imaging and lab reports, multiple MRI images personally reviewed on PACS.  _________________________________   HISTORICAL  CHIEF COMPLAINT:  Chief Complaint  Patient presents with  . Follow-up    RM 12, alone. Last seen 06/04/20. Here for evaluation of vision changes. Today, her vision has improved. She had floaters in vision 2 times yesterday. She is concerned about losing her vision. Having difficulty with word finding still.     HISTORY OF PRESENT ILLNESS:  Chloe Pittman is a 34 y.o. woman with relapsing remitting multiple sclerosis.   Update 06/09/2020: While at work 2 days ago, she noted reduced central vision.   She noted ths was occurring with both eyes.   She also had squiggly lines more to the right that started after 10 minutes.    The entire episode was 10 - 15 minutes.   She has had 2 episodes this week like this.    She also had a few similar episodes earlier this year associated with headache but she also had sparkles in her vision.    However, the episodes this week occurred without headache.  When tired, she has a mild right foot drop.   She has some right spasticity at night.  Adderall has helped her focus/attention some.    Mood is better and she feels ok off med's.     Update 05/17/2020: She is on Tysabri for MS and tolerates it well.   She has JCV Ab drawn earlier this week but it is not back yet.   She denies exacerbations or major new neurologic symptoms.     She does note that memory seems a little worse.   Also has made some errors with days/dates.   She does not always remember conversations.     She has trouble focusing on one voice if two are talking.   She has some word finding difficulty.     She is on gabapentin 300 mg 3 times a day.   She takes Flexeril 5 mg nightly for spasticity/cramps.    Lower dose of gabapentin was associated with more   She has had some depression, better since starting Lexapro   Wellbutrin was also prescribed but she is not taking.      She has fatigue.   She is often exhausted in the evenings and goes to bed around 8:30 pm.  She sleeps 8-9 hours per hr Fitbit.   She doe not snore  Migraine headaches have generally done a little better.  She takes Relpax when 1 occurs.  She has had laynxgitis a few days associated with URI symptoms off/on the last week, better this afternoon.  Update 11/12/2019: She has had visual symptoms since 11/05/2019.   She had a headache and had a squiggly line like a hair in the left visual field.  Both eyes were involved.   Visual symptoms lasted 10 minutes but the headache lasted hours.   Ibuprofen only helped a little bit.   She had nausea and mild photophobia and phonophobia.   Dipping her head and rising up increased the pain.     She had a second similar but milder episode a couple days later that also was followed by headache  Today, she had a scintillating (glittery) zig zag line in the right visual field lasting 5 minutes.    She has a mild headache  now.     She is on Tysabri for her relapsing remitting MS.  She tolerates it well and has had no exacerbation.  She has sleep maintenance insomnia happening every night this week.   She takes gabapentin for RLS with benefit.   UPDATE 08/10/2019: She is on Tysabri and tolerates it well.  She started Feb or March 2020.      Her last JCV Ab ws 0.15 negative 06/29/2019.  She feels stable for the most part.  She had brain and cervical spine MRI in 06/2019 and there were no new lesions.  She was having pain and spasms in her legs,L=R but often starts in right, worse at night.   She increased the gabapentin to 600 qHS.   Leg pain is better though it still wakes her up at night now and then.    She has mild fatigue.  She notes mild cognitive issues, especially word finding issues   Update  04/09/19: (video) She started Tysabri in March (has had 3) and next infusion is June 16.   She is tolerating it well.  She had right arm and bilateral left leg numbness (right started a few days before the left) at the onset of her MS likely representing to spinal exacerbations occurring a few days apart.    She still has numbness in the palms of her hands.  She also has a numb sensation in the lower back/sacral area.  This numbness seems to increase if she walks more.     Her gait is doing well.  She has hit the doorframe a few times but does not feel off balanced.    She has returned to work.  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   Vision is fine.    She notes a little fatigue that is better if she sits down a little bit.   She is better in a few minutes.   She sleeps well.   She has no difficulty with mood or cognition.   FROM INITIAL CONSULT 12/20/2018 She is a 34 year old woman with numbness that developed in the right side eight days ago while at work.    She did not note much clumsiness initially.  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   Symptoms were worse the next day and even worse on Thursday when she had intense numbness and also right arm clumsiness.    The numbness started to involve the left leg on Wednesday, more so Thursday afternoon.   She noted more numbness while in the hospital, especially from the breast down.    MRI's were c/w MS with several enhancing brain lesions and several non-enhancing cervical spine lesions but no definite thoracic spine lesions.  She received 5 days of IV Solumedrol.        Currently, she continues to note abnormal sensation in the right arm now feeling like the arm is bruised or punched.  She has an intense numbness below the breasts, more on the right.  She also notes numbness in both legs.  There is clumsiness on the right side, arm worse than leg.  She notes  mild weakness in the right arm and minimal weakness of the right leg.  None of her symptoms have significantly improved compared to the first day of her steroids a week ago.  She denied any neurologic symptoms such as visual  change, numbness, weakness or clumsiness.  However, she has had more fatigue over the past year.    She had had a couple tick bites and had a negative Lyme test last year  She has no FH of MS.  Her sister has psoriasis.  I personally reviewed the MRIs of the brain, cervical, thoracic and lumbar spine performed 12/18/2018.  The MRI of the brain shows multiple T2/flair hyperintense foci, many in the periventricular and juxtacortical white matter.  There are also a couple foci in the left cerebellar hemisphere and left middle cerebellar peduncle.  About 3 foci enhanced after contrast.  The MRI of the cervical spine shows multiple lesions, to the right adjacent to C2-C3, dorsally towards the right adjacent to C4, centrally, little more to the left adjacent to C6.  The MRI of the thoracic spine did not show any significant findings.   The lumbar spine was essentially normal.  I reviewed the blood work she had in the hospital.  Her white blood cell counts were slightly low.  Vitamin D was 28.9, HIV was negative.  REVIEW OF SYSTEMS: Constitutional: No fevers, chills, sweats, or change in appetite.  She has fatigue Eyes: No visual changes, double vision, eye pain Ear, nose and throat: No hearing loss, ear pain, nasal congestion, sore throat Cardiovascular: No chest pain, palpitations Respiratory: No shortness of breath at rest or with exertion.   No wheezes GastrointestinaI: No nausea, vomiting, diarrhea, abdominal pain, fecal incontinence Genitourinary: No dysuria, urinary retention or frequency.  No nocturia. Musculoskeletal: No neck pain, back pain Integumentary: No rash, pruritus, skin lesions Neurological: as above Psychiatric: No depression at this time.  No  anxiety Endocrine: No palpitations, diaphoresis, change in appetite, change in weigh or increased thirst Hematologic/Lymphatic: No anemia, purpura, petechiae. Allergic/Immunologic: No itchy/runny eyes, nasal congestion, recent allergic reactions, rashes  ALLERGIES: Allergies  Allergen Reactions  . Sulfa Antibiotics Nausea Only    HOME MEDICATIONS:  Current Outpatient Medications:  .  amphetamine-dextroamphetamine (ADDERALL) 10 MG tablet, One po qAM but one po qNoon, Disp: 60 tablet, Rfl: 0 .  cyclobenzaprine (FLEXERIL) 5 MG tablet, Take 1-2 tablets by mouth at bedtime, Disp: 60 tablet, Rfl: 5 .  eletriptan (RELPAX) 40 MG tablet, Take 1 tablet (40 mg total) by mouth as needed for migraine or headache. May repeat in 2 hours if headache persists or recurs., Disp: 10 tablet, Rfl: 11 .  etonogestrel-ethinyl estradiol (NUVARING) 0.12-0.015 MG/24HR vaginal ring, etonogestrel 0.12 mg-ethinyl estradiol 0.015 mg/24 hr vaginal ring, Disp: , Rfl:  .  gabapentin (NEURONTIN) 300 MG capsule, Take 3 capsules (900 mg total) by mouth at bedtime. Start with 300mg (1 cap) at bedside. If needed can take 600mg (2caps) at bedtime., Disp: 270 capsule, Rfl: 4 .  ibuprofen (ADVIL) 200 MG tablet, Take 200 mg by mouth every 6 (six) hours as needed., Disp: , Rfl:  .  natalizumab (TYSABRI) 300 MG/15ML injection, Inject 300 mg into the vein every 28 (twenty-eight) days., Disp: , Rfl:   PAST MEDICAL HISTORY: Past Medical History:  Diagnosis Date  . Hx of varicella   . Medical history non-contributory   . MS (multiple sclerosis) ([place])     PAST SURGICAL HISTORY: Past Surgical History:  Procedure Laterality Date  . FACIAL COSMETIC SURGERY     dog bite  . HEMORRHOID SURGERY N/A 05/14/2018   Procedure: HEMORRHOIDECTOMY;  Surgeon: Michael Boston, MD;  Location: [facility];  Service: General;  Laterality: N/A;  . INNER EAR SURGERY    . PEXY N/A  05/14/2018   Procedure: HEMORRHOIDAL LIGATION/PEXY;  Surgeon: Michael Boston, MD;   Location: [facility];  Service: General;  Laterality: N/A;  . RECTAL EXAM UNDER ANESTHESIA N/A 05/14/2018   Procedure: ANORECTAL EXAM UNDER ANESTHESIA;  Surgeon: Michael Boston, MD;  Location: [facility];  Service: General;  Laterality: N/A;  . TUBES IN EARS      FAMILY HISTORY: Family History  Problem Relation Age of Onset  . Cancer Father        TONSIL   . Hypertension Father   . Diabetes Father   . Cancer Paternal Aunt        SKIN  . Heart disease Maternal Grandmother   . Cancer Paternal Grandmother        LUNG AND LIVER  . Stroke Paternal Grandmother   . Diabetes Mother   . Breast cancer Maternal Aunt 61    SOCIAL HISTORY:  Social History   Socioeconomic History  . Marital status: Married    Spouse name: Not on file  . Number of children: 1  . Years of education: Associates degree  . Highest education level: Not on file  Occupational History  . Occupation: [employer]  Tobacco Use  . Smoking status: Former Research scientist (life sciences)  . Smokeless tobacco: Never Used  . Tobacco comment: Last smoked 5-19, just one cigarette  Vaping Use  . Vaping Use: Former  Substance and Sexual Activity  . Alcohol use: Yes    Comment: SOCIAL - WEEK ENDS   . Drug use: No  . Sexual activity: Yes    Birth control/protection: None  Other Topics Concern  . Not on file  Social History Narrative   Lives with husband    Caffeine use: 2 per day   Right handed    Social Determinants of Health   Financial Resource Strain:   . Difficulty of Paying Living Expenses:   Food Insecurity:   . Worried About Charity fundraiser in the Last Year:   . Arboriculturist in the Last Year:   Transportation Needs:   . Film/video editor (Medical):   Marland Kitchen Lack of Transportation (Non-Medical):   Physical Activity:   . Days of Exercise per Week:   . Minutes of Exercise per Session:   Stress:   . Feeling of Stress :   Social Connections:   . Frequency of Communication with Friends and Family:   . Frequency of  Social Gatherings with Friends and Family:   . Attends Religious Services:   . Active Member of Clubs or Organizations:   . Attends Archivist Meetings:   Marland Kitchen Marital Status:   Intimate Partner Violence:   . Fear of Current or Ex-Partner:   . Emotionally Abused:   Marland Kitchen Physically Abused:   . Sexually Abused:      PHYSICAL EXAM  Vitals:   06/09/20 1323  BP: 107/62  Pulse: 87  Weight: 167 lb 3.2 oz (75.8 kg)  Height: 5\' 10"  (1.778 m)    Hearing Screening   125Hz  250Hz  500Hz  1000Hz  2000Hz  3000Hz  4000Hz  6000Hz  8000Hz   Right ear:           Left ear:             Visual Acuity Screening   Right eye Left eye Both eyes  Without correction: 20/30 20/30 20/40   With correction:        Body mass index is 23.99 kg/m.   General: The patient is well-developed and well-nourished and in no  acute distress .  Funduscopic examination shows normal optic discs and retinal vessels.  Skin: Extremities are without rash or edema.  Neurologic Exam  Mental status: The patient is alert and oriented x 3 at the time of the examination. The patient has apparent normal recent and remote memory, with an apparently normal attention span and concentration ability.   Speech is normal.  Cranial nerves: Extraocular movements are full. Pupils are equal, round, and reactive to light and accomodation.  Color vision is normal.   Facial strength is normal.  Trapezius and sternocleidomastoid strength is normal. No dysarthria is noted.   . No obvious hearing deficits are noted.  Motor:  Muscle bulk is normal.   Tone is normal. Strength is 5/5  Sensory: Sensation to touch and vibration is symmetric now  Coordination: Cerebellar testing reveals good finger-nose-finger and bilateral heel-to-shin  Gait and station: Station is normal.  Gait is normal.  Tandem gait is mildly wide.. Romberg is borderline positive  Reflexes: Deep tendon reflexes are normal in the arms but increased in the legs, right greater  than left.  There is no ankle clonus.      Multiple sclerosis (HCC)  High risk medication use  Migraine with aura and without status migrainosus, not intractable  Visual disturbances   1.  The episodes of visual disturbance lasting 10 to 15 minutes a much more consistent with all associated with acephalgia migraine rather than optic neuritis.  Therefore, there is no need to do steroid at this time.  She had a cluster of these spells around the first of the year and is currently experiencing another cluster.  I discussed with her that if these occur more frequently we would need to consider a prophylactic treatment.  However, if the frequency improves then this may not be necessary.  Because these episodes only last 10 to 15 minutes, there is no need to take a medication when 1 occurs.  However, if a spell was followed by headaches she should take one of the triptans.   2.   Continue Tysabri.  We have scheduled MRI of the brain to determine if there is any subclinical progression.  If this is occurring we would need to consider a different disease modifying therapy.   2.   Relpax for classic migraine  3.   Take gabapentin and Flexeril at night nightly to help restless legs and sleep maintenance insomnia 4.   For now, continue Adderall 10 mg p.o. in the morning and at noon to help with attention deficit and MS related cognitive disturbance.  The dose can be increased if needed in the future. 5.  rtc 6 months or sooner if new or worsening neurologic symptoms     Nolen Lindamood A. Felecia Shelling, MD, [place] 01/05/8249, 5:39 PM Certified in Neurology, Clinical Neurophysiology, Sleep Medicine, Pain Medicine and Neuroimaging  [facility] [address] [place],  [address] [phone]

## 2020-06-13 DIAGNOSIS — G35 Multiple sclerosis: Secondary | ICD-10-CM | POA: Diagnosis not present

## 2020-06-14 ENCOUNTER — Telehealth: Payer: Self-pay | Admitting: *Deleted

## 2020-06-14 NOTE — Telephone Encounter (Signed)
Received fax from touch prescribing program that pt re-authorized for Tysabri from 06/13/20-01/14/21. Pt enrollment number: [id number]. Account: [facility]. Site auth number: [id number].

## 2020-06-21 ENCOUNTER — Ambulatory Visit (INDEPENDENT_AMBULATORY_CARE_PROVIDER_SITE_OTHER): Payer: BC Managed Care – PPO

## 2020-06-21 DIAGNOSIS — G35 Multiple sclerosis: Secondary | ICD-10-CM | POA: Diagnosis not present

## 2020-06-21 MED ORDER — GADOBENATE DIMEGLUMINE 529 MG/ML IV SOLN
15.0000 mL | Freq: Once | INTRAVENOUS | Status: AC | PRN
  Administered 2020-06-21: 15 mL via INTRAVENOUS

## 2020-07-16 IMAGING — MG DIGITAL DIAGNOSTIC UNILATERAL LEFT MAMMOGRAM WITH TOMO AND CAD
4 series · 4 of 12 positions shown · non-contrast
Comparison: Previous exam(s).

CLINICAL DATA: 33-year-old female for evaluation of possible LEFT
breast mass on baseline screening mammogram

EXAM:
DIGITAL DIAGNOSTIC UNILATERAL LEFT MAMMOGRAM WITH TOMO

[L CC synth-2D]
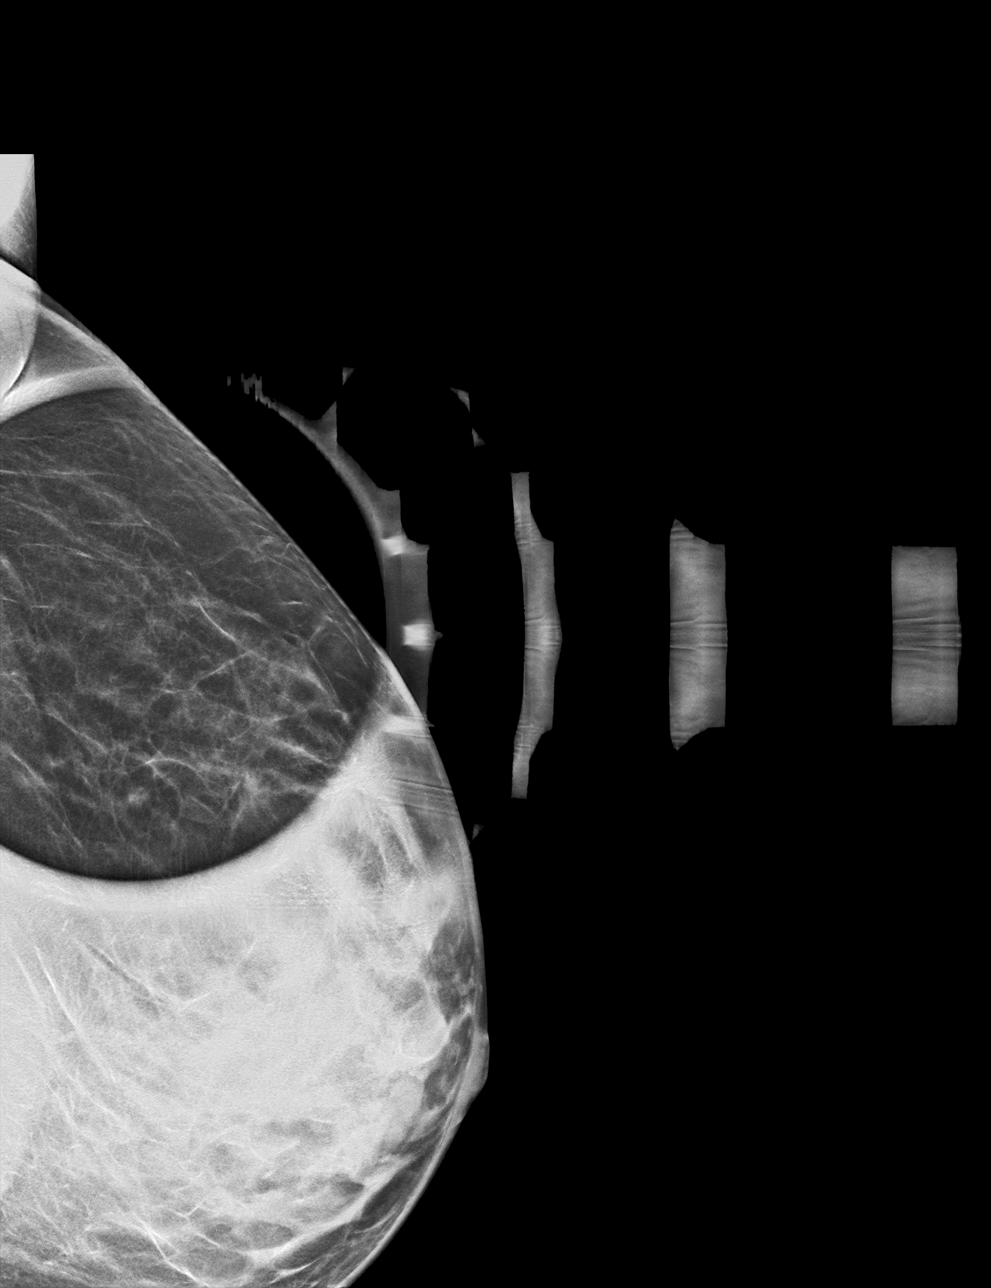

[L MLO synth-2D]
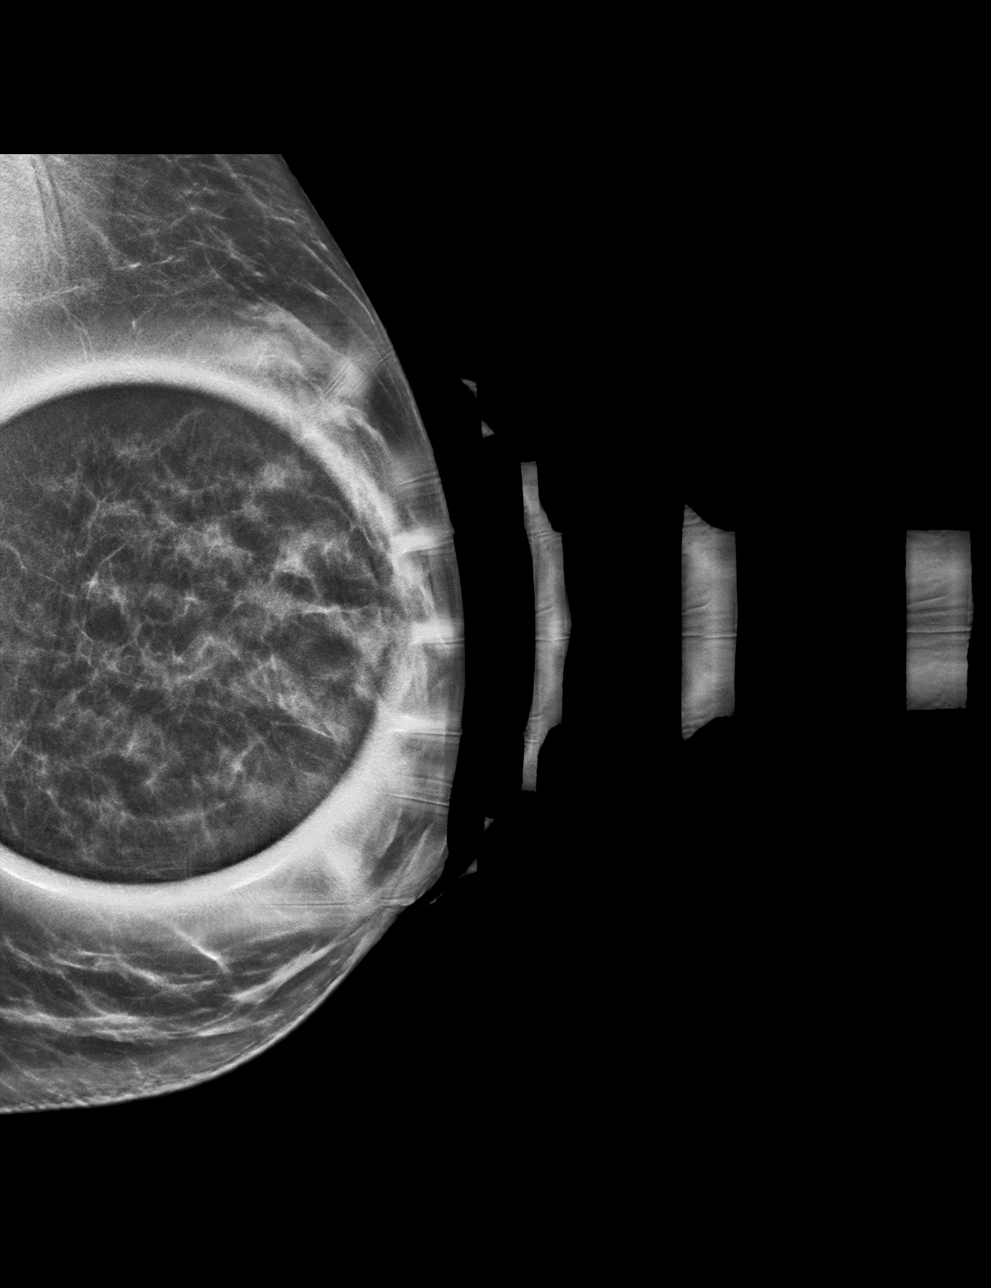

[L MLO tomo · tomo slice 26/51.0]
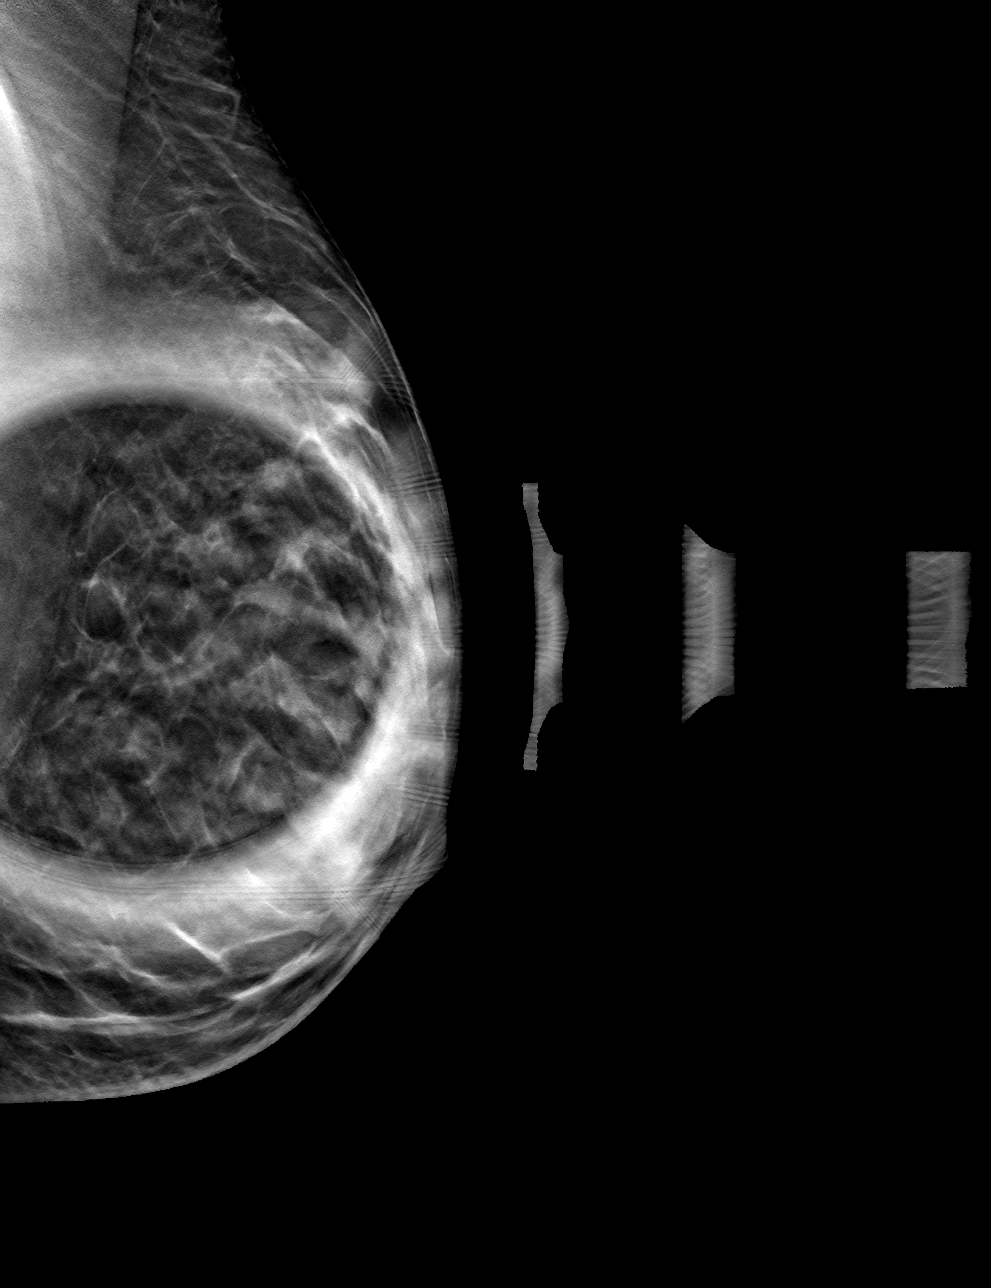

[L CC tomo · tomo slice 23/45.0]
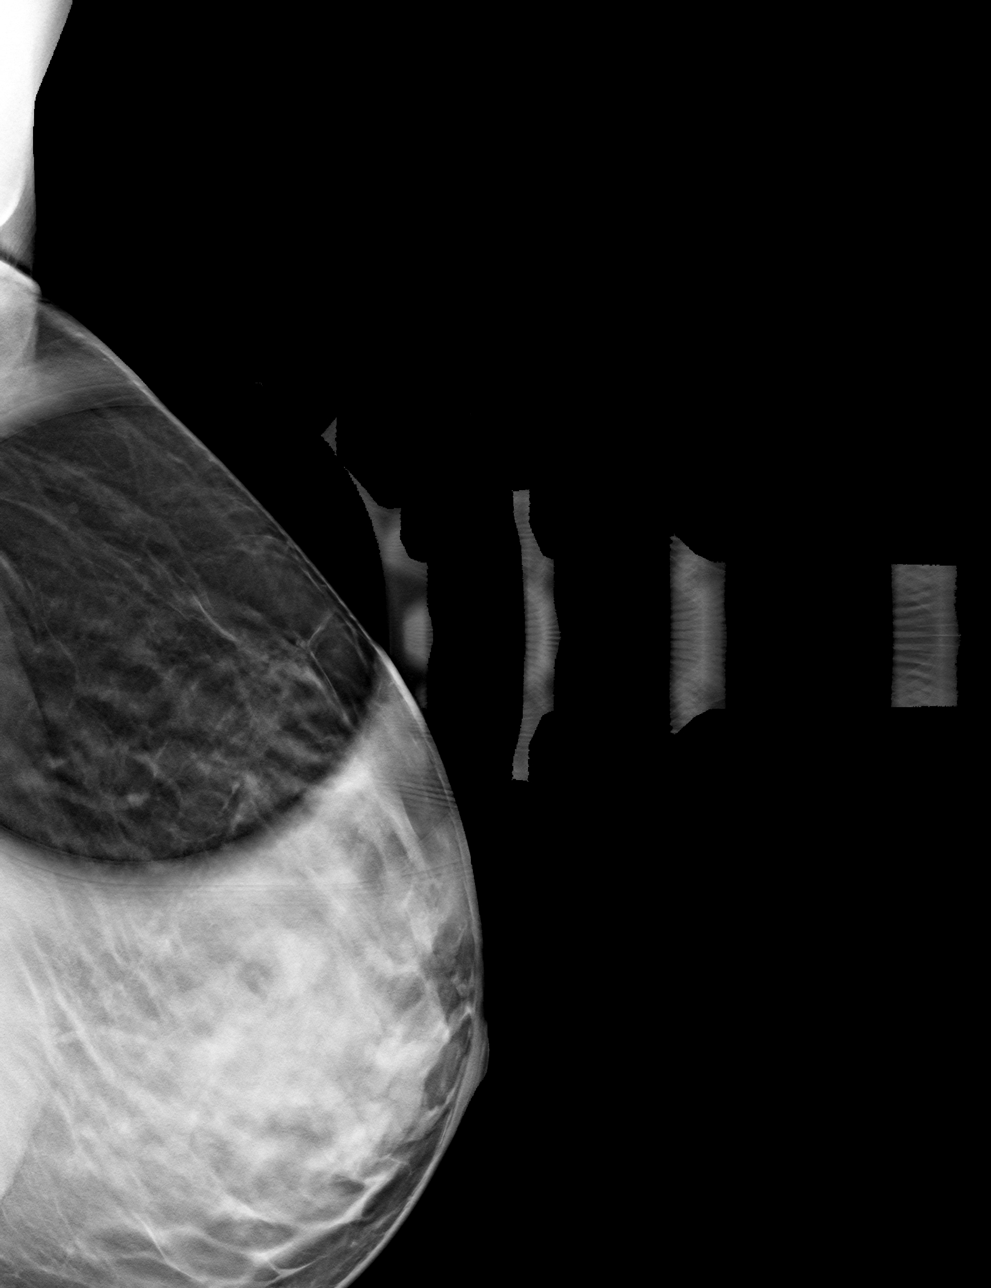

[4 of 12 positions shown; findings below may reference images not displayed]

ACR Breast Density Category c: The breast tissue is heterogeneously
dense, which may obscure small masses.
FINDINGS: 2D/3D spot compression views of the LEFT breast demonstrate no
persistent suspicious mass, distortion or worrisome calcifications
at the site of the screening study finding.
IMPRESSION: No persistent abnormality at the site of the screening study finding

RECOMMENDATION:
Bilateral screening mammogram at age 40, unless otherwise indicated

I have discussed the findings and recommendations with the patient.
If applicable, a reminder letter will be sent to the patient
regarding the next appointment.

BI-RADS CATEGORY  1: Negative.

## 2020-07-18 ENCOUNTER — Other Ambulatory Visit: Payer: Self-pay | Admitting: Neurology

## 2020-07-18 DIAGNOSIS — I7300 Raynaud's syndrome without gangrene: Secondary | ICD-10-CM

## 2020-07-18 DIAGNOSIS — G35 Multiple sclerosis: Secondary | ICD-10-CM | POA: Diagnosis not present

## 2020-07-18 NOTE — Progress Notes (Signed)
She has frequent spells of fingers/toes feeling cold, sometimes with them turning white at times.  We will check some labs for Raynauds.

## 2020-08-15 DIAGNOSIS — G35 Multiple sclerosis: Secondary | ICD-10-CM | POA: Diagnosis not present

## 2020-09-08 ENCOUNTER — Other Ambulatory Visit: Payer: Self-pay | Admitting: Neurology

## 2020-09-12 DIAGNOSIS — G35 Multiple sclerosis: Secondary | ICD-10-CM | POA: Diagnosis not present

## 2020-09-27 DIAGNOSIS — R509 Fever, unspecified: Secondary | ICD-10-CM | POA: Diagnosis not present

## 2020-09-27 DIAGNOSIS — U071 COVID-19: Secondary | ICD-10-CM | POA: Diagnosis not present

## 2020-09-28 ENCOUNTER — Telehealth: Payer: Self-pay | Admitting: Neurology

## 2020-09-28 NOTE — Telephone Encounter (Addendum)
Spoke with Dr. Felecia Shelling. He recommends she consider the monoclonal antibody infusion. She should f/u with PCP for eval/treatment. They can also order this infusion.  Called and spoke with pt. She tested positive yesterday for covid-19. She is symptomatic. She will call PCP for further treatment options/get antibody infusion set up. I also sent message to infusion suite to make them aware. Per Dr. Felecia Shelling, ok for her to keep Tysbari infusion on 10/10/20.

## 2020-09-28 NOTE — Telephone Encounter (Signed)
Pt. has recently been diagnosed with COVID & wants to know what steps she needs to take. Please advise.

## 2020-09-29 ENCOUNTER — Other Ambulatory Visit: Payer: Self-pay | Admitting: Nurse Practitioner

## 2020-09-29 DIAGNOSIS — U071 COVID-19: Secondary | ICD-10-CM

## 2020-09-29 DIAGNOSIS — G35 Multiple sclerosis: Secondary | ICD-10-CM

## 2020-09-29 NOTE — Progress Notes (Signed)
I connected by phone with Chloe Pittman on 09/29/2020 at 10:15 AM to discuss the potential use of a new treatment for mild to moderate COVID-19 viral infection in non-hospitalized patients.  This patient is a 34 y.o. female that meets the FDA criteria for Emergency Use Authorization of COVID monoclonal antibody casirivimab/imdevimab, bamlanivimab/eteseviamb, or sotrovimab.  Has a (+) direct SARS-CoV-2 viral test result  Has mild or moderate COVID-19   Is NOT hospitalized due to COVID-19  Is within 10 days of symptom onset  Has at least one of the high risk factor(s) for progression to severe COVID-19 and/or hospitalization as defined in EUA.  Specific high risk criteria : Immunosuppressive Disease or Treatment and Neurodevelopmental disorder   I have spoken and communicated the following to the patient or parent/caregiver regarding COVID monoclonal antibody treatment:  1. FDA has authorized the emergency use for the treatment of mild to moderate COVID-19 in adults and pediatric patients with positive results of direct SARS-CoV-2 viral testing who are 34 years of age and older weighing at least 40 kg, and who are at high risk for progressing to severe COVID-19 and/or hospitalization.  2. The significant known and potential risks and benefits of COVID monoclonal antibody, and the extent to which such potential risks and benefits are unknown.  3. Information on available alternative treatments and the risks and benefits of those alternatives, including clinical trials.  4. Patients treated with COVID monoclonal antibody should continue to self-isolate and use infection control measures (e.g., wear mask, isolate, social distance, avoid sharing personal items, clean and disinfect "high touch" surfaces, and frequent handwashing) according to CDC guidelines.   5. The patient or parent/caregiver has the option to accept or refuse COVID monoclonal antibody treatment.  After reviewing this  information with the patient, the patient has agreed to receive one of the available covid 19 monoclonal antibodies and will be provided an appropriate fact sheet prior to infusion. [name], NP 09/29/2020 10:15 AM

## 2020-10-01 ENCOUNTER — Ambulatory Visit (HOSPITAL_COMMUNITY)
Admission: RE | Admit: 2020-10-01 | Discharge: 2020-10-01 | Disposition: A | Discharge status: Home / Self Care | Payer: BC Managed Care – PPO | Source: Ambulatory Visit | Attending: Pulmonary Disease | Admitting: Pulmonary Disease

## 2020-10-01 DIAGNOSIS — G35 Multiple sclerosis: Secondary | ICD-10-CM | POA: Insufficient documentation

## 2020-10-01 DIAGNOSIS — U071 COVID-19: Secondary | ICD-10-CM

## 2020-10-01 DIAGNOSIS — G35D Multiple sclerosis, unspecified: Secondary | ICD-10-CM

## 2020-10-01 MED ORDER — METHYLPREDNISOLONE SODIUM SUCC 125 MG IJ SOLR: 125.0000 mg | Freq: Once | INTRAMUSCULAR | Status: DC | PRN

## 2020-10-01 MED ORDER — SOTROVIMAB 500 MG/8ML IV SOLN
500.0000 mg | Freq: Once | INTRAVENOUS | Status: AC
  Administered 2020-10-01: 500 mg via INTRAVENOUS

## 2020-10-01 MED ORDER — FAMOTIDINE IN NACL 20-0.9 MG/50ML-% IV SOLN: 20.0000 mg | Freq: Once | INTRAVENOUS | Status: DC | PRN

## 2020-10-01 MED ORDER — DIPHENHYDRAMINE HCL 50 MG/ML IJ SOLN: 50.0000 mg | Freq: Once | INTRAMUSCULAR | Status: DC | PRN

## 2020-10-01 MED ORDER — SODIUM CHLORIDE 0.9 % IV SOLN: INTRAVENOUS | Status: DC | PRN

## 2020-10-01 MED ORDER — EPINEPHRINE 0.3 MG/0.3ML IJ SOAJ: 0.3000 mg | Freq: Once | INTRAMUSCULAR | Status: DC | PRN

## 2020-10-01 MED ORDER — ALBUTEROL SULFATE HFA 108 (90 BASE) MCG/ACT IN AERS: 2.0000 | INHALATION_SPRAY | Freq: Once | RESPIRATORY_TRACT | Status: DC | PRN

## 2020-10-01 NOTE — Progress Notes (Signed)
Diagnosis: COVID-19  Physician: Dr. Patrick Wright  Procedure: Covid Infusion Clinic Med: Sotrovimab infusion - Provided patient with sotrovimab fact sheet for patients, parents, and caregivers prior to infusion.   Complications: No immediate complications noted  Discharge: Discharged home    

## 2020-10-01 NOTE — Discharge Instructions (Signed)

## 2020-10-01 NOTE — Progress Notes (Signed)
Patient reviewed Fact Sheet for Patients, Parents, and Caregivers for Emergency Use Authorization (EUA) of Sotrovimab for the Treatment of Coronavirus. Patient also reviewed and is agreeable to the estimated cost of treatment. Patient is agreeable to proceed.   

## 2020-10-03 ENCOUNTER — Other Ambulatory Visit (HOSPITAL_COMMUNITY): Payer: Self-pay

## 2020-10-06 ENCOUNTER — Other Ambulatory Visit: Payer: Self-pay | Admitting: Neurology

## 2020-10-06 MED ORDER — AMPHETAMINE-DEXTROAMPHETAMINE 10 MG PO TABS
ORAL_TABLET | ORAL | 0 refills | Status: DC
Start: 2020-10-06 — End: 2020-12-01

## 2020-10-06 NOTE — Telephone Encounter (Signed)
Pt is needing a refill on her amphetamine-dextroamphetamine (ADDERALL) 10 MG tablet sent in to the [facility] on [address]

## 2020-10-10 DIAGNOSIS — G35 Multiple sclerosis: Secondary | ICD-10-CM | POA: Diagnosis not present

## 2020-10-18 DIAGNOSIS — D225 Melanocytic nevi of trunk: Secondary | ICD-10-CM | POA: Diagnosis not present

## 2020-10-18 DIAGNOSIS — D2261 Melanocytic nevi of right upper limb, including shoulder: Secondary | ICD-10-CM | POA: Diagnosis not present

## 2020-10-18 DIAGNOSIS — L918 Other hypertrophic disorders of the skin: Secondary | ICD-10-CM | POA: Diagnosis not present

## 2020-10-18 DIAGNOSIS — L858 Other specified epidermal thickening: Secondary | ICD-10-CM | POA: Diagnosis not present

## 2020-11-23 ENCOUNTER — Ambulatory Visit: Payer: BC Managed Care – PPO | Admitting: Family Medicine

## 2020-11-28 ENCOUNTER — Telehealth: Payer: Self-pay | Admitting: *Deleted

## 2020-11-28 ENCOUNTER — Other Ambulatory Visit: Payer: Self-pay | Admitting: *Deleted

## 2020-11-28 ENCOUNTER — Other Ambulatory Visit: Payer: Self-pay

## 2020-11-28 DIAGNOSIS — G35 Multiple sclerosis: Secondary | ICD-10-CM | POA: Diagnosis not present

## 2020-11-28 DIAGNOSIS — Z79899 Other long term (current) drug therapy: Secondary | ICD-10-CM

## 2020-11-28 NOTE — Telephone Encounter (Signed)
Placed [facility] lab in [facility] lock box for routine lab pick up. Results pending. 

## 2020-11-29 LAB — CBC WITH DIFFERENTIAL/PLATELET
Basophils Absolute: 0.1 10*3/uL (ref 0.0–0.2)
Basos: 1 %
EOS (ABSOLUTE): 0.8 10*3/uL — ABNORMAL HIGH (ref 0.0–0.4)
Eos: 10 %
Hematocrit: 38 % (ref 34.0–46.6)
Hemoglobin: 12.7 g/dL (ref 11.1–15.9)
Immature Grans (Abs): 0 10*3/uL (ref 0.0–0.1)
Immature Granulocytes: 0 %
Lymphocytes Absolute: 3.8 10*3/uL — ABNORMAL HIGH (ref 0.7–3.1)
Lymphs: 48 %
MCH: 30.5 pg (ref 26.6–33.0)
MCHC: 33.4 g/dL (ref 31.5–35.7)
MCV: 91 fL (ref 79–97)
Monocytes Absolute: 0.7 10*3/uL (ref 0.1–0.9)
Monocytes: 9 %
Neutrophils Absolute: 2.5 10*3/uL (ref 1.4–7.0)
Neutrophils: 32 %
Platelets: 222 10*3/uL (ref 150–450)
RBC: 4.16 x10E6/uL (ref 3.77–5.28)
RDW: 12.9 % (ref 11.7–15.4)
WBC: 7.9 10*3/uL (ref 3.4–10.8)

## 2020-12-01 ENCOUNTER — Other Ambulatory Visit: Payer: Self-pay | Admitting: Neurology

## 2020-12-01 MED ORDER — AMPHETAMINE-DEXTROAMPHETAMINE 10 MG PO TABS
ORAL_TABLET | ORAL | 0 refills | Status: DC
Start: 2020-12-01 — End: 2021-03-09

## 2020-12-01 NOTE — Telephone Encounter (Signed)
Pt. is requesting a refill for amphetamine-dextroamphetamine (ADDERALL) 10 MG tablet. She is asking for a 90 day supply.  Pharmacy: [facility] [phone]

## 2020-12-01 NOTE — Addendum Note (Signed)
Addended byRonnald Ramp, EMMA L on: 12/01/2020 01:00 PM   Modules accepted: Orders

## 2020-12-12 ENCOUNTER — Telehealth: Payer: Self-pay | Admitting: *Deleted

## 2020-12-12 NOTE — Telephone Encounter (Signed)
JCV antibody drawn on 11/28/20 indeterminate, index: 0.20. Inhibition assay: negative

## 2020-12-12 NOTE — Telephone Encounter (Signed)
Faxed completed/signed Tysabri pt status report and reauth questionnaire to [facility] at [phone]. Received confirmation.   Received fax from touch prescribing program that pt re-authorized for Tysabri from 12/12/20-07/16/21. Pt enrollment number: [id number]. Account: [facility]. Site auth number: [id number].

## 2020-12-14 ENCOUNTER — Ambulatory Visit (INDEPENDENT_AMBULATORY_CARE_PROVIDER_SITE_OTHER): Payer: BC Managed Care – PPO | Admitting: Neurology

## 2020-12-14 ENCOUNTER — Encounter: Payer: Self-pay | Admitting: Neurology

## 2020-12-14 ENCOUNTER — Other Ambulatory Visit: Payer: Self-pay

## 2020-12-14 VITALS — BP 103/62 | HR 82 | Ht 70.0 in | Wt 163.0 lb

## 2020-12-14 DIAGNOSIS — F988 Other specified behavioral and emotional disorders with onset usually occurring in childhood and adolescence: Secondary | ICD-10-CM

## 2020-12-14 DIAGNOSIS — G35 Multiple sclerosis: Secondary | ICD-10-CM

## 2020-12-14 DIAGNOSIS — Z79899 Other long term (current) drug therapy: Secondary | ICD-10-CM | POA: Diagnosis not present

## 2020-12-14 DIAGNOSIS — G2581 Restless legs syndrome: Secondary | ICD-10-CM

## 2020-12-14 DIAGNOSIS — R260 Ataxic gait: Secondary | ICD-10-CM

## 2020-12-14 NOTE — Progress Notes (Signed)
[facility]  PATIENT: Chloe Pittman DOB: 02-05-1986  REFERRING DOCTOR OR PCP: Chloe Kid, MD SOURCE: Patient, emergency room/hospital notes, imaging and lab reports, multiple MRI images personally reviewed on PACS.  _________________________________   HISTORICAL  CHIEF COMPLAINT:  Chief Complaint  Patient presents with  . Follow-up    Last seen 06/09/2020. On Tysabri for MS. Last infusion: 11/28/20, next: 12/26/20. Receives at [facility] with intrafusion. Last JCV ab 11/28/20 indeterminate, index: 0.20, inhibition assay: negative.    HISTORY OF PRESENT ILLNESS:  Chloe Pittman is a 35 y.o. woman with relapsing remitting multiple sclerosis.   Update 12/14/2020: She is on Tysabri and tolerates it well.  LAst JCV 11/28/20 and was negative 0.2.     She has not had any more episodes of visual changes as she had last year.   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   She has h/o migraine, none recently.  When HA occurs she notes it in her eyes.   She has some occipital pan.   She denies difficulties with gait, strength.   She has had a few   Gait is about the same with mildly reduced balance .  When she is tired, she has a mild right foot drop.   She has some right spasticity at night.  No numbness now.   Bladder is fine.  Vision is fine.  She feels stressed but not depressed.   Life is often chaotic.   She has a 60 1/2 yo child.   Adderall has helped her focus/attention some.   Most days she takes 15 mg in am rather than 10 and 10.  She notes she is more fatigued and 'brain is mush' if she does nit take.    She had Covid-23 September 2020 and had the monoclonal antibodies.   She is not vaccinated.      MS HISTORY She developed numbness in her right side 12/2018.    She did not note much clumsiness initially.  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   Symptoms were worse the next day when she had intense numbness and also right arm clumsiness.  She  presented to the ED.   She noted more numbness while in the hospital, especially from the breast down.    MRI's 12/18/2018 were c/w MS with several enhancing brain lesions and several non-enhancing cervical spine lesions but no definite thoracic spine lesions.  She received 5 days of IV Solumedrol and improved.  I saw her shortly after the hospital stay and we started Tysabri in March 2020   She has no FH of MS.  Her sister has psoriasis.  IMAGING MRI brain 06/21/2020 shows T2/FLAIR hyperintense foci in the hemispheres and cerebellum in a pattern and configuration consistent with chronic demyelinating plaque associated with multiple sclerosis.  None of the foci appear to be acute and they do not enhance.  Compared to the MRI from 06/16/2019, there are no new lesions.  MRI of the brain 12/18/2018 shows multiple T2/flair hyperintense foci, many in the periventricular and juxtacortical white matter.  There are also a couple foci in the left cerebellar hemisphere and left middle cerebellar peduncle.  About 3 foci enhanced after contrast.    MRI of the cervical spine shows multiple lesions, to the right adjacent to C2-C3, dorsally towards the right adjacent to C4, centrally, little more to the left adjacent to C6.    LabsVitamin D was 28.9, HIV was  negative.  REVIEW OF SYSTEMS: Constitutional: No fevers, chills, sweats, or change in appetite.  She has fatigue Eyes: No visual changes, double vision, eye pain Ear, nose and throat: No hearing loss, ear pain, nasal congestion, sore throat Cardiovascular: No chest pain, palpitations Respiratory: No shortness of breath at rest or with exertion.   No wheezes GastrointestinaI: No nausea, vomiting, diarrhea, abdominal pain, fecal incontinence Genitourinary: No dysuria, urinary retention or frequency.  No nocturia. Musculoskeletal: No neck pain, back pain Integumentary: No rash, pruritus, skin lesions Neurological: as above Psychiatric: No depression at this  time.  No anxiety Endocrine: No palpitations, diaphoresis, change in appetite, change in weigh or increased thirst Hematologic/Lymphatic: No anemia, purpura, petechiae. Allergic/Immunologic: No itchy/runny eyes, nasal congestion, recent allergic reactions, rashes  ALLERGIES: Allergies  Allergen Reactions  . Sulfa Antibiotics Nausea Only    HOME MEDICATIONS:  Current Outpatient Medications:  .  amphetamine-dextroamphetamine (ADDERALL) 10 MG tablet, One po qAM but one po qNoon, Disp: 60 tablet, Rfl: 0 .  eletriptan (RELPAX) 40 MG tablet, Take 1 tablet (40 mg total) by mouth as needed for migraine or headache. May repeat in 2 hours if headache persists or recurs., Disp: 10 tablet, Rfl: 11 .  ibuprofen (ADVIL) 200 MG tablet, Take 200 mg by mouth every 6 (six) hours as needed., Disp: , Rfl:  .  natalizumab (TYSABRI) 300 MG/15ML injection, Inject 300 mg into the vein every 28 (twenty-eight) days., Disp: , Rfl:  .  sertraline (ZOLOFT) 25 MG tablet, Take 25 mg by mouth daily., Disp: , Rfl:  .  cyclobenzaprine (FLEXERIL) 5 MG tablet, TAKE 1 TO 2 TABLETS BY MOUTH AT BEDTIME (Patient not taking: Reported on 12/14/2020), Disp: 60 tablet, Rfl: 5 .  etonogestrel-ethinyl estradiol (NUVARING) 0.12-0.015 MG/24HR vaginal ring, etonogestrel 0.12 mg-ethinyl estradiol 0.015 mg/24 hr vaginal ring (Patient not taking: Reported on 12/14/2020), Disp: , Rfl:  .  gabapentin (NEURONTIN) 300 MG capsule, Take 3 capsules (900 mg total) by mouth at bedtime. Start with 300mg (1 cap) at bedside. If needed can take 600mg (2caps) at bedtime. (Patient not taking: Reported on 12/14/2020), Disp: 270 capsule, Rfl: 4  PAST MEDICAL HISTORY: Past Medical History:  Diagnosis Date  . Hx of varicella   . Medical history non-contributory   . MS (multiple sclerosis) ([place])     PAST SURGICAL HISTORY: Past Surgical History:  Procedure Laterality Date  . FACIAL COSMETIC SURGERY     dog bite  . HEMORRHOID SURGERY N/A 05/14/2018    Procedure: HEMORRHOIDECTOMY;  Surgeon: Michael Boston, MD;  Location: [facility];  Service: General;  Laterality: N/A;  . INNER EAR SURGERY    . PEXY N/A 05/14/2018   Procedure: HEMORRHOIDAL LIGATION/PEXY;  Surgeon: Michael Boston, MD;  Location: [facility];  Service: General;  Laterality: N/A;  . RECTAL EXAM UNDER ANESTHESIA N/A 05/14/2018   Procedure: ANORECTAL EXAM UNDER ANESTHESIA;  Surgeon: Michael Boston, MD;  Location: [facility];  Service: General;  Laterality: N/A;  . TUBES IN EARS      FAMILY HISTORY: Family History  Problem Relation Age of Onset  . Cancer Father        TONSIL   . Hypertension Father   . Diabetes Father   . Cancer Paternal Aunt        SKIN  . Heart disease Maternal Grandmother   . Cancer Paternal Grandmother        LUNG AND LIVER  . Stroke Paternal Grandmother   . Diabetes Mother   . Breast cancer Maternal Aunt 46  SOCIAL HISTORY:  Social History   Socioeconomic History  . Marital status: Married    Spouse name: Not on file  . Number of children: 1  . Years of education: Associates degree  . Highest education level: Not on file  Occupational History  . Occupation: [employer]  Tobacco Use  . Smoking status: Former Research scientist (life sciences)  . Smokeless tobacco: Never Used  . Tobacco comment: Last smoked 5-19, just one cigarette  Vaping Use  . Vaping Use: Former  Substance and Sexual Activity  . Alcohol use: Yes    Comment: SOCIAL - WEEK ENDS   . Drug use: No  . Sexual activity: Yes    Birth control/protection: None  Other Topics Concern  . Not on file  Social History Narrative   Lives with husband    Caffeine use: 2 per day   Right handed    Social Determinants of Health   Financial Resource Strain: Not on file  Food Insecurity: Not on file  Transportation Needs: Not on file  Physical Activity: Not on file  Stress: Not on file  Social Connections: Not on file  Intimate Partner Violence: Not on file     PHYSICAL EXAM  Vitals:   12/14/20 1550   BP: 103/62  Pulse: 82  Weight: 163 lb (73.9 kg)  Height: 5\' 10"  (1.778 m)   No exam data present   Body mass index is 23.39 kg/m.   General: The patient is well-developed and well-nourished and in no acute distress .  Funduscopic examination shows normal optic discs and retinal vessels.  Skin: Extremities are without rash or edema.  Neurologic Exam  Mental status: The patient is alert and oriented x 3 at the time of the examination. The patient has apparent normal recent and remote memory, with an apparently normal attention span and concentration ability.   Speech is normal.  Cranial nerves: Extraocular movements are full. Pupils are equal, round, and reactive to light and accomodation.  Color vision is normal.   Facial strength is normal.  Trapezius and sternocleidomastoid strength is normal. No dysarthria is noted.   . No obvious hearing deficits are noted.  Motor:  Muscle bulk is normal.   Tone is normal. Strength is 5/5  Sensory: Sensation to touch and vibration is symmetric now  Coordination: Cerebellar testing reveals good finger-nose-finger and bilateral heel-to-shin  Gait and station: Station is normal.  Gait is normal.  Tandem gait is mildly wide.. Romberg is borderline positive  Reflexes: Deep tendon reflexes are normal in the arms but increased in the legs, with spread at the knees.  No ankle clonus.      Multiple sclerosis (HCC)  Restless leg syndrome  Attention deficit disorder (ADD) without hyperactivity  High risk medication use  Ataxic gait   1.  Continue Tysabri. JCV Ab is negative 2.   Continue Adderall 10-15 mg po qAm  3.   Take gabapentin nightly to help restless legs and sleep maintenance insomnia 4.   rtc 6 months or sooner if new or worsening neurologic symptoms     Michalle Rademaker A. Felecia Shelling, MD, [facility] Shave 05/08/8269, 7:86 PM Certified in Neurology, Clinical Neurophysiology, Sleep Medicine, Pain Medicine and Neuroimaging  [facility] [address], [place], [place] [address] [phone]

## 2020-12-26 DIAGNOSIS — G35 Multiple sclerosis: Secondary | ICD-10-CM | POA: Diagnosis not present

## 2021-01-02 ENCOUNTER — Other Ambulatory Visit: Payer: Self-pay | Admitting: Neurology

## 2021-01-02 DIAGNOSIS — R200 Anesthesia of skin: Secondary | ICD-10-CM

## 2021-01-02 DIAGNOSIS — G35 Multiple sclerosis: Secondary | ICD-10-CM

## 2021-01-02 DIAGNOSIS — M21379 Foot drop, unspecified foot: Secondary | ICD-10-CM

## 2021-01-03 ENCOUNTER — Telehealth: Payer: Self-pay | Admitting: Neurology

## 2021-01-03 NOTE — Telephone Encounter (Signed)
no to the covid questions MR Cervical spine w/wo contrast Dr. Cheree Ditto Josem Kaufmann: [phone] (exp. 01/03/21 to 02/01/21). Patient is scheduled at [facility] for 01/11/21.

## 2021-01-11 ENCOUNTER — Ambulatory Visit: Payer: BC Managed Care – PPO

## 2021-01-11 DIAGNOSIS — R200 Anesthesia of skin: Secondary | ICD-10-CM | POA: Diagnosis not present

## 2021-01-11 DIAGNOSIS — G35 Multiple sclerosis: Secondary | ICD-10-CM | POA: Diagnosis not present

## 2021-01-11 DIAGNOSIS — M21379 Foot drop, unspecified foot: Secondary | ICD-10-CM | POA: Diagnosis not present

## 2021-01-11 MED ORDER — GADOBENATE DIMEGLUMINE 529 MG/ML IV SOLN
15.0000 mL | Freq: Once | INTRAVENOUS | Status: AC | PRN
  Administered 2021-01-11: 15 mL via INTRAVENOUS

## 2021-01-16 ENCOUNTER — Telehealth: Payer: Self-pay | Admitting: Neurology

## 2021-01-16 NOTE — Telephone Encounter (Signed)
Sent mychart message to MD

## 2021-01-16 NOTE — Telephone Encounter (Signed)
Pt called, MRI results show I have a bulging disc. Want to discuss what the treatment would be. Would like a call from the nurse.

## 2021-01-23 DIAGNOSIS — G35 Multiple sclerosis: Secondary | ICD-10-CM | POA: Diagnosis not present

## 2021-02-20 DIAGNOSIS — G35 Multiple sclerosis: Secondary | ICD-10-CM | POA: Diagnosis not present

## 2021-03-09 ENCOUNTER — Other Ambulatory Visit: Payer: Self-pay | Admitting: Neurology

## 2021-03-09 MED ORDER — AMPHETAMINE-DEXTROAMPHETAMINE 10 MG PO TABS
ORAL_TABLET | ORAL | 0 refills | Status: DC
Start: 2021-03-09 — End: 2021-03-20

## 2021-03-09 NOTE — Telephone Encounter (Signed)
Pt is needing a refill on her amphetamine-dextroamphetamine (ADDERALL) 10 MG tablet sent in to the [facility] on [address].

## 2021-03-20 ENCOUNTER — Other Ambulatory Visit: Payer: Self-pay | Admitting: *Deleted

## 2021-03-20 DIAGNOSIS — M21371 Foot drop, right foot: Secondary | ICD-10-CM

## 2021-03-20 DIAGNOSIS — G35 Multiple sclerosis: Secondary | ICD-10-CM | POA: Diagnosis not present

## 2021-03-20 MED ORDER — AMPHETAMINE-DEXTROAMPHETAMINE 10 MG PO TABS
ORAL_TABLET | ORAL | 0 refills | Status: DC
Start: 2021-03-20 — End: 2021-06-07

## 2021-03-20 NOTE — Progress Notes (Signed)
Pt here for infusion today. Reports R foot drop worse. Per Dr. Felecia Shelling, he recommends PT. If ineffective, she should call back and he can send in order for AFO brace to [facility]. I placed referral to PT in [place]. She also reported she only got #30/60 of her Adderall rx from the pharmacy. Unsure why they only have her half. Advised we will send in another prescription for the remanding 30 that she needs for the month. Checked drug registry, she only received #30 on 03/10/21.

## 2021-03-31 ENCOUNTER — Ambulatory Visit (INDEPENDENT_AMBULATORY_CARE_PROVIDER_SITE_OTHER): Payer: BC Managed Care – PPO | Admitting: Physical Therapy

## 2021-03-31 ENCOUNTER — Other Ambulatory Visit: Payer: Self-pay

## 2021-03-31 ENCOUNTER — Encounter: Payer: Self-pay | Admitting: Physical Therapy

## 2021-03-31 DIAGNOSIS — R2689 Other abnormalities of gait and mobility: Secondary | ICD-10-CM

## 2021-03-31 DIAGNOSIS — M6281 Muscle weakness (generalized): Secondary | ICD-10-CM | POA: Diagnosis not present

## 2021-03-31 DIAGNOSIS — R278 Other lack of coordination: Secondary | ICD-10-CM

## 2021-03-31 NOTE — Therapy (Signed)
[place] [address] [place], [place], [address] Phone: [phone]   Fax:  [phone]  Physical Therapy Evaluation  Patient Details  Name: Chloe Pittman MRN: [id number] Date of Birth: 09/15/1986 Referring Provider (PT): Arlice Colt   Encounter Date: 03/31/2021   PT End of Session - 03/31/21 1452    Visit Number 1    Number of Visits 6    Date for PT Re-Evaluation 05/12/21    PT Start Time 1400    PT Stop Time 1442    PT Time Calculation (min) 42 min    Activity Tolerance Patient tolerated treatment well    Behavior During Therapy [facility] for tasks assessed/performed           Past Medical History:  Diagnosis Date  . Hx of varicella   . Medical history non-contributory   . MS (multiple sclerosis) ([place])     Past Surgical History:  Procedure Laterality Date  . FACIAL COSMETIC SURGERY     dog bite  . HEMORRHOID SURGERY N/A 05/14/2018   Procedure: HEMORRHOIDECTOMY;  Surgeon: Michael Boston, MD;  Location: [facility];  Service: General;  Laterality: N/A;  . INNER EAR SURGERY    . PEXY N/A 05/14/2018   Procedure: HEMORRHOIDAL LIGATION/PEXY;  Surgeon: Michael Boston, MD;  Location: [facility];  Service: General;  Laterality: N/A;  . RECTAL EXAM UNDER ANESTHESIA N/A 05/14/2018   Procedure: ANORECTAL EXAM UNDER ANESTHESIA;  Surgeon: Michael Boston, MD;  Location: [facility];  Service: General;  Laterality: N/A;  . TUBES IN EARS      There were no vitals filed for this visit.    Subjective Assessment - 03/31/21 1400    Subjective Pt was diagnosed with MS in 2020 and has been feeling good with use of medications. Pt recently diagnosed with C6-7 "bulging disc" and has numbness tingling when turning head and has had worsening of Rt drop foot.  Numbness has decreased but drop foot has continued.    Pertinent History MS diagnosed 2020, C6-7 disc protusion    Limitations Walking    Patient Stated Goals improve gait, reduce numbness     Currently in Pain? No/denies              [facility] PT Assessment - 03/31/21 0001      Assessment   Medical Diagnosis multiple sclerosis    Referring Provider (PT) Sater, Richard      Balance Screen   Has the patient fallen in the past 6 months No      Prior Function   Level of Independence Independent      Sensation   Light Touch Impaired Detail    Light Touch Impaired Details Impaired RLE      Coordination   Gross Motor Movements are Fluid and Coordinated Yes    Finger Nose Finger Test [facility]    Heel Shin Test [facility]      ROM / Strength   AROM / PROM / Strength AROM;Strength      AROM   Overall AROM Comments Rt ankle ROM WFL, cervical ROM WFL      Strength   Overall Strength Comments Rt LE strength grossly 4/5 and equal to Lt LE, Rt UE strength grossly 4/5      Palpation   Spinal mobility hypomobile CPAs of cervical spine    Palpation comment increased mm tightness bilat cervical paraspinals, Rt upper trap and levator      Ambulation/Gait   Gait Comments Pt  with decreased DF during gait on Rt LE, mild circumduction of Rt foot due to decreased DF in swing phase                      Objective measurements completed on examination: See above findings.       [facility] Adult PT Treatment/Exercise - 03/31/21 0001      Exercises   Exercises Neck;Ankle      Neck Exercises: Theraband   Horizontal ABduction 10 reps;Red    Other Theraband Exercises W's red TB x 10      Neck Exercises: Supine   Neck Retraction 10 reps      Ankle Exercises: Standing   SLS up to 30 seconds Rt LE                  PT Education - 03/31/21 1452    Education Details HEP, PT POC and goals, extensive education on different braces to reduce foot drop    Person(s) Educated Patient    Methods Explanation;Demonstration;Handout    Comprehension Returned demonstration;Verbalized understanding               PT Long Term Goals - 03/31/21 1457      PT LONG TERM GOAL #1    Title Pt will be independent in HEP    Time 6    Period Weeks    Status New    Target Date 05/12/21      PT LONG TERM GOAL #2   Title Pt will improve UE strength to 4+/5 to perform work duties with decreased pain/discomfort    Time 6    Period Weeks    Status New    Target Date 05/12/21      PT LONG TERM GOAL #3   Title Pt will be able to perform gait (with or without brace) x 30 minutes without foot drop    Time 6    Period Weeks    Status New    Target Date 05/12/21                  Plan - 03/31/21 1453    Clinical Impression Statement Pt is 35 y/o female who presents with MS diagnosed in 2020 with decreased UE and LE strength, decreased LE sensation, impaired gait and mm endurance, decreased functional activity tolerance. Pt will benefit from skilled PT to address deficits and improve functional mobility    Personal Factors and Comorbidities Comorbidity 1;Profession;Time since onset of injury/illness/exacerbation    Examination-Activity Limitations Locomotion Level    Examination-Participation Restrictions Occupation;Community Activity    Stability/Clinical Decision Making Evolving/Moderate complexity    Clinical Decision Making Moderate    Rehab Potential Good    PT Frequency 1x / week    PT Duration 6 weeks    PT Treatment/Interventions Cryotherapy;Moist Heat;Iontophoresis 4mg /ml Dexamethasone;Electrical Stimulation;Gait training;Stair training;Neuromuscular re-education;Balance training;Therapeutic exercise;Therapeutic activities;Patient/family education;DME Instruction;Manual techniques;Passive range of motion;Dry needling;Taping    PT Next Visit Plan assess HEP, progress postural exercises, balance    PT Home Exercise Plan [email or web address]    Consulted and Agree with Plan of Care Patient           Patient will benefit from skilled therapeutic intervention in order to improve the following deficits and impairments:  Decreased activity tolerance,Decreased  strength,Improper body mechanics,Difficulty walking,Decreased endurance,Impaired sensation  Visit Diagnosis: Muscle weakness (generalized) - Plan: PT plan of care cert/re-cert  Balance disorder - Plan: PT plan of care cert/re-cert  Other abnormalities of  gait and mobility - Plan: PT plan of care cert/re-cert  Other lack of coordination - Plan: PT plan of care cert/re-cert     Problem List Patient Active Problem List   Diagnosis Date Noted  . Visual disturbances 06/09/2020  . 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 Leg cramps 05/17/2020  . Attention deficit disorder (ADD) without hyperactivity 05/17/2020  . Laryngitis 05/17/2020  . Migraine with aura and without status migrainosus, not intractable 11/12/2019  . Insomnia 11/12/2019  . Restless leg syndrome 11/12/2019  . Lip laceration 05/17/2019  . Anal skin tag 05/17/2019  . Myelitis in diseases classified elsewhere ([place]) 04/09/2019  . High risk medication use 12/25/2018  . Numbness 12/25/2018  . Ataxic gait 12/25/2018  . Vitamin D deficiency 12/25/2018  . Multiple sclerosis ([place]) 12/18/2018  . Prolapsed internal hemorrhoids, grade 3 05/14/2018  . External hemorrhoids with complication 44/69/5072  . Normal labor and delivery 02/14/2016  . Pregnancy 02/13/2016  . Family history of diabetes mellitus type II 11/14/2012   Nikie Cid, PT  Terrez Ander 03/31/2021, 3:01 PM  [facility] [place], [place], [address] Phone: [phone]   Fax:  [phone]  Name: Chloe Pittman MRN: [id number] Date of Birth: 11-03-1986

## 2021-03-31 NOTE — Patient Instructions (Signed)
Access Code: Access Code: [email or web address] URL: [email or web address] Date: 03/31/2021 Prepared by: Isabelle Course  Exercises Supine Cervical Retraction with Towel - 1 x daily - 7 x weekly - 2 sets - 10 reps Seated Shoulder W External Rotation on Swiss Ball - 1 x daily - 7 x weekly - 3 sets - 10 reps Supine Shoulder Horizontal Abduction with Resistance - 1 x daily - 7 x weekly - 3 sets - 10 reps Single Leg Stance - 1 x daily - 7 x weekly - 3 sets - 1 reps - 30 seconds hold

## 2021-04-11 DIAGNOSIS — J029 Acute pharyngitis, unspecified: Secondary | ICD-10-CM | POA: Diagnosis not present

## 2021-04-13 DIAGNOSIS — J029 Acute pharyngitis, unspecified: Secondary | ICD-10-CM | POA: Diagnosis not present

## 2021-04-14 ENCOUNTER — Encounter: Payer: BC Managed Care – PPO | Admitting: Physical Therapy

## 2021-04-17 DIAGNOSIS — G35 Multiple sclerosis: Secondary | ICD-10-CM | POA: Diagnosis not present

## 2021-05-15 ENCOUNTER — Other Ambulatory Visit: Payer: Self-pay

## 2021-05-15 ENCOUNTER — Other Ambulatory Visit: Payer: Self-pay | Admitting: Neurology

## 2021-05-15 DIAGNOSIS — G35 Multiple sclerosis: Secondary | ICD-10-CM | POA: Diagnosis not present

## 2021-05-15 DIAGNOSIS — Z79899 Other long term (current) drug therapy: Secondary | ICD-10-CM

## 2021-05-15 DIAGNOSIS — R200 Anesthesia of skin: Secondary | ICD-10-CM

## 2021-05-15 DIAGNOSIS — F988 Other specified behavioral and emotional disorders with onset usually occurring in childhood and adolescence: Secondary | ICD-10-CM

## 2021-05-15 DIAGNOSIS — R260 Ataxic gait: Secondary | ICD-10-CM

## 2021-05-16 ENCOUNTER — Telehealth: Payer: Self-pay | Admitting: Neurology

## 2021-05-16 LAB — CBC WITH DIFFERENTIAL/PLATELET
Basophils Absolute: 0.1 10*3/uL (ref 0.0–0.2)
Basos: 1 %
EOS (ABSOLUTE): 0.9 10*3/uL — ABNORMAL HIGH (ref 0.0–0.4)
Eos: 9 %
Hematocrit: 36.8 % (ref 34.0–46.6)
Hemoglobin: 12.3 g/dL (ref 11.1–15.9)
Immature Grans (Abs): 0 10*3/uL (ref 0.0–0.1)
Immature Granulocytes: 0 %
Lymphocytes Absolute: 4.4 10*3/uL — ABNORMAL HIGH (ref 0.7–3.1)
Lymphs: 47 %
MCH: 30.5 pg (ref 26.6–33.0)
MCHC: 33.4 g/dL (ref 31.5–35.7)
MCV: 91 fL (ref 79–97)
Monocytes Absolute: 0.7 10*3/uL (ref 0.1–0.9)
Monocytes: 8 %
Neutrophils Absolute: 3.3 10*3/uL (ref 1.4–7.0)
Neutrophils: 35 %
Platelets: 205 10*3/uL (ref 150–450)
RBC: 4.03 x10E6/uL (ref 3.77–5.28)
RDW: 12.8 % (ref 11.7–15.4)
WBC: 9.4 10*3/uL (ref 3.4–10.8)

## 2021-05-16 NOTE — Telephone Encounter (Signed)
Placed JCV lab in [facility] lock box on 05/15/2021 for routine lab pick up. Results pending.

## 2021-05-22 NOTE — Telephone Encounter (Signed)
Received the results for the patient's JCV AB testing. It was negative and the index value is 0.19. will make sure that Dr Felecia Shelling is aware as well.

## 2021-05-25 DIAGNOSIS — Z6821 Body mass index (BMI) 21.0-21.9, adult: Secondary | ICD-10-CM | POA: Diagnosis not present

## 2021-05-25 DIAGNOSIS — Z01419 Encounter for gynecological examination (general) (routine) without abnormal findings: Secondary | ICD-10-CM | POA: Diagnosis not present

## 2021-06-07 ENCOUNTER — Telehealth: Payer: Self-pay | Admitting: Neurology

## 2021-06-07 ENCOUNTER — Other Ambulatory Visit: Payer: Self-pay | Admitting: Neurology

## 2021-06-07 MED ORDER — AMPHETAMINE-DEXTROAMPHETAMINE 10 MG PO TABS
ORAL_TABLET | ORAL | 0 refills | Status: DC
Start: 2021-06-07 — End: 2021-06-12

## 2021-06-07 NOTE — Telephone Encounter (Signed)
I have routed this request to Dr Sater for review. The pt is due for the medication and [place] registry was verified.  

## 2021-06-07 NOTE — Telephone Encounter (Signed)
Pt is requesting a refill for amphetamine-dextroamphetamine (ADDERALL) 10 MG tablet.  Pharmacy: [facility] [phone]

## 2021-06-12 ENCOUNTER — Telehealth: Payer: Self-pay | Admitting: Neurology

## 2021-06-12 ENCOUNTER — Other Ambulatory Visit: Payer: Self-pay | Admitting: Neurology

## 2021-06-12 DIAGNOSIS — G35 Multiple sclerosis: Secondary | ICD-10-CM | POA: Diagnosis not present

## 2021-06-12 MED ORDER — AMPHETAMINE-DEXTROAMPHETAMINE 10 MG PO TABS
ORAL_TABLET | ORAL | 0 refills | Status: DC
Start: 2021-06-12 — End: 2021-07-25

## 2021-06-12 NOTE — Telephone Encounter (Signed)
Pt called requesting refill for amphetamine-dextroamphetamine(ADDERALL) 10 MG tablet. Pharmacy [facility] [phone].

## 2021-06-12 NOTE — Telephone Encounter (Signed)
Checked drug registry. She last refilled 03/20/21 #30 Last seen 12/14/20 and next follow up 06/21/21.

## 2021-06-12 NOTE — Telephone Encounter (Signed)
Refill was already sent for the pt on 06/07/2021. Shows confirmation was sent 1:36 pm on 06/07/2021. Pt should be able to get the medication. I have called [facility] to check on the status for the patient. The script had to be corrected and worded differently.  I have forwarded to Dr. Felecia Shelling to review and send for the pt

## 2021-06-21 ENCOUNTER — Other Ambulatory Visit: Payer: Self-pay

## 2021-06-21 ENCOUNTER — Encounter: Payer: Self-pay | Admitting: Neurology

## 2021-06-21 ENCOUNTER — Ambulatory Visit (INDEPENDENT_AMBULATORY_CARE_PROVIDER_SITE_OTHER): Payer: BC Managed Care – PPO | Admitting: Neurology

## 2021-06-21 VITALS — BP 110/64 | HR 91 | Ht 70.0 in | Wt 148.0 lb

## 2021-06-21 DIAGNOSIS — F09 Unspecified mental disorder due to known physiological condition: Secondary | ICD-10-CM

## 2021-06-21 DIAGNOSIS — M21379 Foot drop, unspecified foot: Secondary | ICD-10-CM

## 2021-06-21 DIAGNOSIS — F988 Other specified behavioral and emotional disorders with onset usually occurring in childhood and adolescence: Secondary | ICD-10-CM | POA: Diagnosis not present

## 2021-06-21 DIAGNOSIS — G35 Multiple sclerosis: Secondary | ICD-10-CM

## 2021-06-21 DIAGNOSIS — Z79899 Other long term (current) drug therapy: Secondary | ICD-10-CM

## 2021-06-21 DIAGNOSIS — G35D Multiple sclerosis, unspecified: Secondary | ICD-10-CM

## 2021-06-21 NOTE — Progress Notes (Signed)
[facility]  PATIENT: Chloe Pittman DOB: 1985/11/12  REFERRING DOCTOR OR PCP: Dineen Kid, MD SOURCE: Patient, emergency room/hospital notes, imaging and lab reports, multiple MRI images personally reviewed on PACS.  _________________________________   HISTORICAL  CHIEF COMPLAINT:  Chief Complaint  Patient presents with   Follow-up    Rm 2, alone. Here for 6 month MS f/u, pt reports being stable, no new sx. Numbness in leg have become more frequent.     HISTORY OF PRESENT ILLNESS:  Chloe Pittman is a 35 y.o. woman with relapsing remitting multiple sclerosis.   Update 06/21/2021: She is on Tysabri and tolerates it well.  Last JCV 05/16/21 and was negative 0.19   She has not had any more episodes of visual changes as she had last year.   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   She has h/o migraine, none recently.  When HA occurs she notes it in her eyes.   She has some occipital pan.   She denies difficulties with gait, strength.   She has had a few   She notes numbness and weakness in her right leg, present since diagnosis.  This got better but is bothering her more recently.  The numbness is worse if legs crossed.      Gait has mildly reduced balance.  She stumbles but but no falls.  When she is tired or hot has done more activity, she has a right foot drop.   She has some right spasticity at night.   Bladder is fine.  Vision is fine.  She feels stressed but not depressed.   She notes some cognitive issues.  She has reduced focus/attention and sometimes has some processing errors.  Adderall has helped her focus/attention some.   Most days she takes 15 or 20 mg in am of the immediate release.  She notes she is more fatigued with worse cognition when she does not take it.  She had Covid-23 September 2020 and had the monoclonal antibodies.   She is not vaccinated.      MS HISTORY She developed numbness in her right side 12/2018.    She did not note much  clumsiness initially.  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   Symptoms were worse the next day when she had intense numbness and also right arm clumsiness.  She presented to the ED.   She noted more numbness while in the hospital, especially from the breast down.    MRI's 12/18/2018 were c/w MS with 1-2 right temporal enhancing brain lesions and several non-enhancing cervical spine lesions but no definite thoracic spine lesions.  She received 5 days of IV Solumedrol and improved.  I saw her shortly after the hospital stay and we started Tysabri in March 2020   She has no FH of MS.  Her sister has psoriasis.  IMAGING MRI brain 06/21/2020 shows T2/FLAIR hyperintense foci in the hemispheres and cerebellum in a pattern and configuration consistent with chronic demyelinating plaque associated with multiple sclerosis.  None of the foci appear to be acute and they do not enhance.  Compared to the MRI from 06/16/2019, there are no new lesions.  MRI of the brain 12/18/2018 shows multiple T2/flair hyperintense foci, many in the periventricular and juxtacortical white matter.  There are also a couple foci in the left cerebellar hemisphere and left middle cerebellar peduncle.  About 3 foci enhanced after contrast.    MRI of the  cervical spine shows multiple lesions, to the right adjacent to C2-C3, dorsally towards the right adjacent to C4, centrally, little more to the left adjacent to C6.    LabsVitamin D was 28.9, HIV was negative.  REVIEW OF SYSTEMS: Constitutional: No fevers, chills, sweats, or change in appetite.  She has fatigue Eyes: No visual changes, double vision, eye pain Ear, nose and throat: No hearing loss, ear pain, nasal congestion, sore throat Cardiovascular: No chest pain, palpitations Respiratory:  No shortness of breath at rest or with exertion.   No wheezes GastrointestinaI: No nausea, vomiting, diarrhea, abdominal pain, fecal incontinence Genitourinary:  No  dysuria, urinary retention or frequency.  No nocturia. Musculoskeletal:  No neck pain, back pain Integumentary: No rash, pruritus, skin lesions Neurological: as above Psychiatric: No depression at this time.  No anxiety Endocrine: No palpitations, diaphoresis, change in appetite, change in weigh or increased thirst Hematologic/Lymphatic:  No anemia, purpura, petechiae. Allergic/Immunologic: No itchy/runny eyes, nasal congestion, recent allergic reactions, rashes  ALLERGIES: Allergies  Allergen Reactions   Sulfa Antibiotics Nausea Only    HOME MEDICATIONS:  Current Outpatient Medications:    amphetamine-dextroamphetamine (ADDERALL) 10 MG tablet, 1 tablet in the morning. May take additional tablet at noon if necessary, Disp: 60 tablet, Rfl: 0   cyclobenzaprine (FLEXERIL) 5 MG tablet, TAKE 1 TO 2 TABLETS BY MOUTH AT BEDTIME (Patient taking differently: Take 5-10 mg by mouth at bedtime as needed. As needed), Disp: 60 tablet, Rfl: 5   eletriptan (RELPAX) 40 MG tablet, Take 1 tablet (40 mg total) by mouth as needed for migraine or headache. May repeat in 2 hours if headache persists or recurs., Disp: 10 tablet, Rfl: 11   gabapentin (NEURONTIN) 300 MG capsule, Take 3 capsules (900 mg total) by mouth at bedtime. Start with '300mg'$ (1 cap) at bedside. If needed can take '600mg'$ (2caps) at bedtime., Disp: 270 capsule, Rfl: 4   ibuprofen (ADVIL) 200 MG tablet, Take 200 mg by mouth every 6 (six) hours as needed., Disp: , Rfl:    natalizumab (TYSABRI) 300 MG/15ML injection, Inject 300 mg into the vein every 28 (twenty-eight) days., Disp: , Rfl:    sertraline (ZOLOFT) 25 MG tablet, Take 25 mg by mouth daily., Disp: , Rfl:   PAST MEDICAL HISTORY: Past Medical History:  Diagnosis Date   Hx of varicella    Medical history non-contributory    MS (multiple sclerosis) ([place])     PAST SURGICAL HISTORY: Past Surgical History:  Procedure Laterality Date   FACIAL COSMETIC SURGERY     dog bite   HEMORRHOID  SURGERY N/A 05/14/2018   Procedure: HEMORRHOIDECTOMY;  Surgeon: Michael Boston, MD;  Location: [facility];  Service: General;  Laterality: N/A;   INNER EAR SURGERY     PEXY N/A 05/14/2018   Procedure: HEMORRHOIDAL LIGATION/PEXY;  Surgeon: Michael Boston, MD;  Location: [facility];  Service: General;  Laterality: N/A;   RECTAL EXAM UNDER ANESTHESIA N/A 05/14/2018   Procedure: ANORECTAL EXAM UNDER ANESTHESIA;  Surgeon: Michael Boston, MD;  Location: [facility];  Service: General;  Laterality: N/A;   TUBES IN EARS      FAMILY HISTORY: Family History  Problem Relation Age of Onset   Cancer Father        TONSIL    Hypertension Father    Diabetes Father    Cancer Paternal Aunt        SKIN   Heart disease Maternal Grandmother    Cancer Paternal Grandmother        LUNG  AND LIVER   Stroke Paternal Grandmother    Diabetes Mother    Breast cancer Maternal Aunt 62    SOCIAL HISTORY:  Social History   Socioeconomic History   Marital status: Married    Spouse name: Not on file   Number of children: 1   Years of education: Associates degree   Highest education level: Not on file  Occupational History   Occupation: [employer]. [employer]  Tobacco Use   Smoking status: Former   Smokeless tobacco: Never   Tobacco comments:    Last smoked 5-19, just one cigarette  Vaping Use   Vaping Use: Former  Substance and Sexual Activity   Alcohol use: Yes    Comment: SOCIAL - WEEK ENDS    Drug use: No   Sexual activity: Yes    Birth control/protection: None  Other Topics Concern   Not on file  Social History Narrative   Lives with husband    Caffeine use: 2 per day   Right handed    Social Determinants of Health   Financial Resource Strain: Not on file  Food Insecurity: Not on file  Transportation Needs: Not on file  Physical Activity: Not on file  Stress: Not on file  Social Connections: Not on file  Intimate Partner Violence: Not on file     PHYSICAL EXAM  Vitals:   06/21/21 1555  BP:  110/64  Pulse: 91  Weight: 148 lb (67.1 kg)  Height: '5\' 10"'$  (1.778 m)   No results found.   Body mass index is 21.24 kg/m.   General: The patient is well-developed and well-nourished and in no acute distress .   Skin: Extremities are without rash or edema.  Neurologic Exam  Mental status: The patient is alert and oriented x 3 at the time of the examination. The patient has apparent normal recent and remote memory, with an apparently normal attention span and concentration ability.   Speech is normal.  Cranial nerves: Extraocular movements are full.  Color vision was symmetric.  Visual acuity was symmetric.  Facial strength and sensation was normal.  Trapezius and sternocleidomastoid strength is normal. No dysarthria is noted.   . No obvious hearing deficits are noted.  Motor:  Muscle bulk is normal.   Tone is normal. Strength is 5/5  Sensory: Sensation to touch and vibration is symmetric now  Coordination: Cerebellar testing reveals good finger-nose-finger and bilateral heel-to-shin  Gait and station: Station is normal.  Gait is normal.  Tandem gait is mildly wide.. Romberg is borderline positive  Reflexes: Deep tendon reflexes are normal in the arms but increased in the legs, with spread at the knees.  No ankle clonus.      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  Continue Tysabri. JCV Ab is negative.   Check MRI brain 2.   She has some cognitive issues related to MS including attention deficit.  Continue Adderall 10 - 20 mg po qAm .   Will be next refill of the medication, consider XR 25 mg as the benefit often wears off later in the day. 3.   Take gabapentin nightly to help restless legs and sleep maintenance insomnia 4.   She had a lot of questions about the  difference between relapsing remitting MS and  secondary progressive MS and her current status.  I pulled up her original MRIs from 2020 and the more recent MRI (which was stable).  I will place her in the relapsing remitting MS category though because she has several spinal plaques, she is susceptible to eventually transition to a secondary progressive MS.   5.   rtc 6 months or sooner if new or worsening neurologic symptoms  40-minute office visit with the majority of the time spent face-to-face for history and physical, discussion/counseling and decision-making.  Additional time with record review and documentation.    Katanya Schlie A. Felecia Shelling, MD, [facility] [id number], [id number] PM Certified in Neurology, Clinical Neurophysiology, Sleep Medicine, Pain Medicine and Neuroimaging  [facility] [address] [place], [place] [address] [phone]

## 2021-06-26 ENCOUNTER — Telehealth: Payer: Self-pay | Admitting: Neurology

## 2021-06-26 NOTE — Telephone Encounter (Signed)
Patient r/s for 07/19/21.

## 2021-06-26 NOTE — Telephone Encounter (Signed)
MR Brain w/wo contrast Dr. Cheree Ditto Josem Kaufmann: [id number] (exp. 06/22/21 to 07/21/21). Patient is scheduled at [facility] for 06/27/21.

## 2021-06-27 ENCOUNTER — Other Ambulatory Visit: Payer: BC Managed Care – PPO

## 2021-07-10 ENCOUNTER — Other Ambulatory Visit: Payer: Self-pay | Admitting: Neurology

## 2021-07-17 DIAGNOSIS — G35 Multiple sclerosis: Secondary | ICD-10-CM | POA: Diagnosis not present

## 2021-07-19 ENCOUNTER — Ambulatory Visit (INDEPENDENT_AMBULATORY_CARE_PROVIDER_SITE_OTHER): Payer: BC Managed Care – PPO

## 2021-07-19 DIAGNOSIS — G35 Multiple sclerosis: Secondary | ICD-10-CM

## 2021-07-19 DIAGNOSIS — F09 Unspecified mental disorder due to known physiological condition: Secondary | ICD-10-CM

## 2021-07-19 MED ORDER — GADOBENATE DIMEGLUMINE 529 MG/ML IV SOLN
15.0000 mL | Freq: Once | INTRAVENOUS | Status: AC | PRN
  Administered 2021-07-19: 15 mL via INTRAVENOUS

## 2021-07-24 ENCOUNTER — Telehealth: Payer: Self-pay | Admitting: Neurology

## 2021-07-24 NOTE — Telephone Encounter (Signed)
Printed message and provided to [facility] to follow up on

## 2021-07-24 NOTE — Telephone Encounter (Signed)
Pt called stating that her insurance sent her a letter stating that her natalizumab (TYSABRI) 300 MG/15ML injection will not be covered due to coding and not enough information proving that she is ill and needing this injection. Pt would like to be called when this has been adjusted.

## 2021-07-25 ENCOUNTER — Other Ambulatory Visit: Payer: Self-pay | Admitting: Neurology

## 2021-07-25 MED ORDER — AMPHETAMINE-DEXTROAMPHETAMINE 10 MG PO TABS
ORAL_TABLET | ORAL | 0 refills | Status: DC
Start: 2021-07-25 — End: 2021-08-29

## 2021-07-25 NOTE — Telephone Encounter (Signed)
Pt is needing a refill request for her amphetamine-dextroamphetamine (ADDERALL) 10 MG tablet sent in to the [facility] [address]

## 2021-07-26 NOTE — Telephone Encounter (Signed)
Sent message to [facility] to contact pt with an update.

## 2021-07-26 NOTE — Telephone Encounter (Signed)
Got update from Maudie Mercury that Barnett Applebaum in the infusion suite called pt w/ update.

## 2021-07-26 NOTE — Telephone Encounter (Signed)
Pt called says she is getting the run around and nothing is being done to help. Pt requesting a call back.

## 2021-07-31 NOTE — Telephone Encounter (Signed)
Pt called, [employer] need paperwork stating which MS patent has. [employer] sent over paperwork requesting information.. They said they will not cover next infusion if do not receive the information. Would like a call from the nurse.

## 2021-07-31 NOTE — Telephone Encounter (Signed)
Informed infusion suite of this message and asked that they follow up with pt on this.

## 2021-08-21 DIAGNOSIS — G35 Multiple sclerosis: Secondary | ICD-10-CM | POA: Diagnosis not present

## 2021-08-29 ENCOUNTER — Other Ambulatory Visit: Payer: Self-pay | Admitting: Neurology

## 2021-08-29 MED ORDER — AMPHETAMINE-DEXTROAMPHETAMINE 10 MG PO TABS
ORAL_TABLET | ORAL | 0 refills | Status: DC
Start: 2021-08-29 — End: 2021-10-13

## 2021-08-29 NOTE — Telephone Encounter (Addendum)
Lattie Haw in phone room tried to reach pt to schedule next f/u around 12/22/21. VM not set up.  [facility] will f/u w/ pt about Tysabri questions. Checked drug registry. She last refiled adderall 07/26/21 #60.   I tried calling pt. Rang, went to VM, VM not set up. I do not see where we prescribe zoloft.

## 2021-08-29 NOTE — Telephone Encounter (Signed)
Pt called requesting refill for amphetamine-dextroamphetamine (ADDERALL) 10 MG tablet & sertraline (ZOLOFT) 25 MG tablet 90 day supply. [facility] [place] also asking about her Tysabri, asking for a call back.

## 2021-08-29 NOTE — Telephone Encounter (Signed)
Liane spoke w/ pt. Tysabri approved through 9/23. Previous denial for an old date of service that [facility] is working on the back end to take care of.   I called pt and scheduled 6 month f/u for 12/25/21 at 1:30p w/ Dr. Felecia Shelling. States she thinks OB called in zoloft for her before, she will contact them for refill. Aware I will send request for adderall refill to Dr. Krista Blue (WID this afternoon) since Dr. Felecia Shelling out this week.

## 2021-09-07 ENCOUNTER — Telehealth: Payer: Self-pay | Admitting: Neurology

## 2021-09-07 NOTE — Telephone Encounter (Signed)
error 

## 2021-09-07 NOTE — Telephone Encounter (Signed)
Pt called, need information on why insurance is not covering natalizumab (TYSABRI) 300 MG/15ML injection have been on for three years. Would like call from the nurse.

## 2021-09-07 NOTE — Telephone Encounter (Signed)
Gave message to infusion suite to follow up w/ pt.

## 2021-09-18 DIAGNOSIS — G35 Multiple sclerosis: Secondary | ICD-10-CM | POA: Diagnosis not present

## 2021-10-08 ENCOUNTER — Other Ambulatory Visit: Payer: Self-pay | Admitting: Neurology

## 2021-10-13 ENCOUNTER — Other Ambulatory Visit: Payer: Self-pay | Admitting: Psychiatry

## 2021-10-13 MED ORDER — AMPHETAMINE-DEXTROAMPHETAMINE 10 MG PO TABS
ORAL_TABLET | ORAL | 0 refills | Status: DC
Start: 2021-10-13 — End: 2021-12-19

## 2021-10-23 DIAGNOSIS — F419 Anxiety disorder, unspecified: Secondary | ICD-10-CM | POA: Diagnosis not present

## 2021-10-23 DIAGNOSIS — L609 Nail disorder, unspecified: Secondary | ICD-10-CM | POA: Diagnosis not present

## 2021-10-23 DIAGNOSIS — R4789 Other speech disturbances: Secondary | ICD-10-CM | POA: Diagnosis not present

## 2021-10-24 DIAGNOSIS — G35 Multiple sclerosis: Secondary | ICD-10-CM | POA: Diagnosis not present

## 2021-10-27 DIAGNOSIS — L821 Other seborrheic keratosis: Secondary | ICD-10-CM | POA: Diagnosis not present

## 2021-10-27 DIAGNOSIS — D224 Melanocytic nevi of scalp and neck: Secondary | ICD-10-CM | POA: Diagnosis not present

## 2021-10-27 DIAGNOSIS — L720 Epidermal cyst: Secondary | ICD-10-CM | POA: Diagnosis not present

## 2021-10-27 DIAGNOSIS — D2262 Melanocytic nevi of left upper limb, including shoulder: Secondary | ICD-10-CM | POA: Diagnosis not present

## 2021-11-17 DIAGNOSIS — G35 Multiple sclerosis: Secondary | ICD-10-CM | POA: Diagnosis not present

## 2021-11-17 DIAGNOSIS — N6452 Nipple discharge: Secondary | ICD-10-CM | POA: Diagnosis not present

## 2021-11-17 DIAGNOSIS — Z3202 Encounter for pregnancy test, result negative: Secondary | ICD-10-CM | POA: Diagnosis not present

## 2021-11-21 ENCOUNTER — Other Ambulatory Visit: Payer: Self-pay | Admitting: *Deleted

## 2021-11-21 ENCOUNTER — Other Ambulatory Visit (INDEPENDENT_AMBULATORY_CARE_PROVIDER_SITE_OTHER): Payer: Self-pay

## 2021-11-21 DIAGNOSIS — G35 Multiple sclerosis: Secondary | ICD-10-CM | POA: Diagnosis not present

## 2021-11-21 DIAGNOSIS — Z79899 Other long term (current) drug therapy: Secondary | ICD-10-CM

## 2021-11-21 DIAGNOSIS — Z0289 Encounter for other administrative examinations: Secondary | ICD-10-CM

## 2021-11-27 ENCOUNTER — Other Ambulatory Visit: Payer: Self-pay | Admitting: Family Medicine

## 2021-11-27 DIAGNOSIS — N6452 Nipple discharge: Secondary | ICD-10-CM

## 2021-12-02 LAB — CBC WITH DIFFERENTIAL/PLATELET
Basophils Absolute: 0.1 10*3/uL (ref 0.0–0.2)
Basos: 1 %
EOS (ABSOLUTE): 0.5 10*3/uL — ABNORMAL HIGH (ref 0.0–0.4)
Eos: 6 %
Hematocrit: 35.9 % (ref 34.0–46.6)
Hemoglobin: 12.1 g/dL (ref 11.1–15.9)
Immature Grans (Abs): 0 10*3/uL (ref 0.0–0.1)
Immature Granulocytes: 0 %
Lymphocytes Absolute: 3.9 10*3/uL — ABNORMAL HIGH (ref 0.7–3.1)
Lymphs: 51 %
MCH: 30.6 pg (ref 26.6–33.0)
MCHC: 33.7 g/dL (ref 31.5–35.7)
MCV: 91 fL (ref 79–97)
Monocytes Absolute: 0.8 10*3/uL (ref 0.1–0.9)
Monocytes: 11 %
Neutrophils Absolute: 2.4 10*3/uL (ref 1.4–7.0)
Neutrophils: 31 %
Platelets: 236 10*3/uL (ref 150–450)
RBC: 3.95 x10E6/uL (ref 3.77–5.28)
RDW: 12.8 % (ref 11.7–15.4)
WBC: 7.7 10*3/uL (ref 3.4–10.8)

## 2021-12-02 LAB — STRATIFY JCV(TM) AB W/INDEX
JCV Antibody: NEGATIVE
JCV Index Value: 0.14

## 2021-12-19 ENCOUNTER — Other Ambulatory Visit: Payer: Self-pay | Admitting: Psychiatry

## 2021-12-19 DIAGNOSIS — G35 Multiple sclerosis: Secondary | ICD-10-CM | POA: Diagnosis not present

## 2021-12-19 MED ORDER — AMPHETAMINE-DEXTROAMPHETAMINE 10 MG PO TABS: ORAL_TABLET | ORAL | 0 refills | Status: DC

## 2021-12-19 NOTE — Telephone Encounter (Signed)
Received refill request for Adderall 10mg .  Last OV was on 06/21/21.  Next OV is scheduled for 12/25/21 .  Last RX was written on 10/13/21 for 60 tabs.   [place] [facility] Database has been reviewed.

## 2021-12-20 ENCOUNTER — Telehealth: Payer: Self-pay | Admitting: Neurology

## 2021-12-20 MED ORDER — AMPHETAMINE-DEXTROAMPHETAMINE 10 MG PO TABS: ORAL_TABLET | ORAL | 0 refills | Status: DC

## 2021-12-20 NOTE — Telephone Encounter (Signed)
Pt requesting resend amphetamine-dextroamphetamine (ADDERALL) 10 MG tablet to another pharmacy due to current pharmacy out of stock.  [facility] [address] [place], [place] [address] Phone: [phone]

## 2021-12-21 ENCOUNTER — Encounter: Payer: Self-pay | Admitting: Neurology

## 2021-12-21 ENCOUNTER — Other Ambulatory Visit: Payer: Self-pay | Admitting: Neurology

## 2021-12-21 MED ORDER — AMPHETAMINE-DEXTROAMPHETAMINE 10 MG PO TABS: ORAL_TABLET | ORAL | 0 refills | Status: DC

## 2021-12-25 ENCOUNTER — Ambulatory Visit (INDEPENDENT_AMBULATORY_CARE_PROVIDER_SITE_OTHER): Payer: BC Managed Care – PPO | Admitting: Neurology

## 2021-12-25 ENCOUNTER — Encounter: Payer: Self-pay | Admitting: Neurology

## 2021-12-25 VITALS — BP 103/69 | HR 93 | Ht 70.0 in | Wt 154.6 lb

## 2021-12-25 DIAGNOSIS — F988 Other specified behavioral and emotional disorders with onset usually occurring in childhood and adolescence: Secondary | ICD-10-CM | POA: Diagnosis not present

## 2021-12-25 DIAGNOSIS — G35 Multiple sclerosis: Secondary | ICD-10-CM | POA: Diagnosis not present

## 2021-12-25 DIAGNOSIS — Z79899 Other long term (current) drug therapy: Secondary | ICD-10-CM

## 2021-12-25 DIAGNOSIS — F09 Unspecified mental disorder due to known physiological condition: Secondary | ICD-10-CM

## 2021-12-25 DIAGNOSIS — R260 Ataxic gait: Secondary | ICD-10-CM

## 2021-12-25 DIAGNOSIS — N643 Galactorrhea not associated with childbirth: Secondary | ICD-10-CM | POA: Diagnosis not present

## 2021-12-25 DIAGNOSIS — G35D Multiple sclerosis, unspecified: Secondary | ICD-10-CM

## 2021-12-25 NOTE — Progress Notes (Addendum)
[facility]  PATIENT: Chloe Pittman DOB: November 06, 1985  REFERRING DOCTOR OR PCP: Dineen Kid, MD SOURCE: Patient, emergency room/hospital notes, imaging and lab reports, multiple MRI images personally reviewed on PACS.  _________________________________   HISTORICAL  CHIEF COMPLAINT:  Chief Complaint  Patient presents with   Follow-up    Rm 16, alone. Here for 6 month MS f/u, on Tysabri and tolerating well. Last infusion date: 12/19/21 and Next infusion date: pt has noticed her cycles have been off. Having 2 in a month a times, light in flow, lasting 2-3 days. Pt is lactating and not pregnant, noticed around Jan and was check by PCP and normal. Having a mammogram Monday.     HISTORY OF PRESENT ILLNESS:  Chloe Pittman is a 36 y.o. woman with relapsing remitting multiple sclerosis.   Update 06/21/2021: She is on Tysabri and tolerates it well.  Last JCV 11/21/2021 and was negative 0.14.       She has not had any more episodes of visual changes as she had last year.   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   She has h/o migraine, none recently.  When HA occurs she notes it in her eyes.   She has some occipital pan.   She denies difficulties with gait, strength.   She has had a few   She notes numbness and weakness in her right leg, present since diagnosis.  This got better but is bothering her more recently.  The numbness is worse if legs crossed.      Gait and  balance are reduced.  She stumbles but but no falls.  When she is tired or hot has done more activity, she has a right foot drop.   She has some right spasticity at night.   Bladder is fine.  Vision is fine.  She notes her menstrual cycle is more irregular (2 cycles some months)- not on nay hormonal therapy or OCP.   She had been very regular .  Additionally x 6 weeks she has had lactation bilaterally.   Se had labs done that were normal (I don't have details of which labs).    MRI 07/2021 re-reviewed personally and  showed pituitary gland was normal height ad enhancement seemed normal.     Mood is better  She is on Zoloft 75 mg and is better than at 50 mg.   She notes some cognitive issues.  She has reduced focus/attention and sometimes has some processing errors.  Adderall has helped her focus/attention some.   Most days she takes 15 or 20 mg in am of the immediate release.  She notes she is more fatigued with worse cognition when she does not take it.  She had Covid-23 September 2020 and had the monoclonal antibodies.   She is not vaccinated.      MS HISTORY She developed numbness in her right side 12/2018.    She did not note much clumsiness initially.  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   Symptoms were worse the next day when she had intense numbness and also right arm clumsiness.  She presented to the ED.   She noted more numbness while in the hospital, especially from the breast down.    MRI's 12/18/2018 were c/w MS with 1-2 right temporal enhancing brain lesions and several non-enhancing cervical spine lesions but no definite thoracic spine lesions.  She received 5 days of IV Solumedrol and improved.  I saw her shortly after the hospital stay and we started Tysabri in March 2020   She has no FH of MS.  Her sister has psoriasis.  IMAGING MRI of the brain 07/19/2021 showed T2/FLAIR hyperintense foci in the cerebral hemispheres and left cerebellar hemisphere.  The pattern and configuration are consistent with chronic demyelinating plaques associated with multiple sclerosis.  None of the foci appear to be acute.  They do not enhance.  Compared to the MRI dated 06/21/2020, there are no new lesions.   Pituitary appeared normal.    MRI brain 06/21/2020 shows T2/FLAIR hyperintense foci in the hemispheres and cerebellum in a pattern and configuration consistent with chronic demyelinating plaque associated with multiple sclerosis.  None of the foci appear to be acute and they do not  enhance.  Compared to the MRI from 06/16/2019, there are no new lesions.  MRI of the brain 12/18/2018 shows multiple T2/flair hyperintense foci, many in the periventricular and juxtacortical white matter.  There are also a couple foci in the left cerebellar hemisphere and left middle cerebellar peduncle.  About 3 foci enhanced after contrast.    MRI of the cervical spine shows multiple lesions, to the right adjacent to C2-C3, dorsally towards the right adjacent to C4, centrally, little more to the left adjacent to C6.    LabsVitamin D was 28.9, HIV was negative.  REVIEW OF SYSTEMS: Constitutional: No fevers, chills, sweats, or change in appetite.  She has fatigue Eyes: No visual changes, double vision, eye pain Ear, nose and throat: No hearing loss, ear pain, nasal congestion, sore throat Cardiovascular: No chest pain, palpitations Respiratory:  No shortness of breath at rest or with exertion.   No wheezes GastrointestinaI: No nausea, vomiting, diarrhea, abdominal pain, fecal incontinence Genitourinary:  No dysuria, urinary retention or frequency.  No nocturia. Musculoskeletal:  No neck pain, back pain Integumentary: No rash, pruritus, skin lesions Neurological: as above Psychiatric: No depression at this time.  No anxiety Endocrine: No palpitations, diaphoresis, change in appetite, change in weigh or increased thirst Hematologic/Lymphatic:  No anemia, purpura, petechiae. Allergic/Immunologic: No itchy/runny eyes, nasal congestion, recent allergic reactions, rashes  ALLERGIES: Allergies  Allergen Reactions   Sulfa Antibiotics Nausea Only    HOME MEDICATIONS:  Current Outpatient Medications:    amphetamine-dextroamphetamine (ADDERALL) 10 MG tablet, 1 tablet in the morning. May take additional tablet at noon if necessary, Disp: 180 tablet, Rfl: 0   eletriptan (RELPAX) 40 MG tablet, Take 1 tablet (40 mg total) by mouth as needed for migraine or headache. May repeat in 2 hours if headache  persists or recurs., Disp: 10 tablet, Rfl: 11   gabapentin (NEURONTIN) 300 MG capsule, Take 900 mg by mouth as needed. Has if needed. Has not taken last couple months., Disp: , Rfl:    ibuprofen (ADVIL) 200 MG tablet, Take 200 mg by mouth every 6 (six) hours as needed., Disp: , Rfl:    natalizumab (TYSABRI) 300 MG/15ML injection, Inject 300 mg into the vein every 28 (twenty-eight) days., Disp: , Rfl:    sertraline (ZOLOFT) 25 MG tablet, Take 25 mg by mouth daily., Disp: , Rfl:   PAST MEDICAL HISTORY: Past Medical History:  Diagnosis Date   Hx of varicella    Medical history non-contributory    MS (multiple sclerosis) ([place])     PAST SURGICAL HISTORY: Past Surgical History:  Procedure Laterality Date   FACIAL COSMETIC SURGERY     dog bite   HEMORRHOID SURGERY N/A 05/14/2018   Procedure:  HEMORRHOIDECTOMY;  Surgeon: Michael Boston, MD;  Location: [facility];  Service: General;  Laterality: N/A;   INNER EAR SURGERY     PEXY N/A 05/14/2018   Procedure: HEMORRHOIDAL LIGATION/PEXY;  Surgeon: Michael Boston, MD;  Location: [facility];  Service: General;  Laterality: N/A;   RECTAL EXAM UNDER ANESTHESIA N/A 05/14/2018   Procedure: ANORECTAL EXAM UNDER ANESTHESIA;  Surgeon: Michael Boston, MD;  Location: [facility];  Service: General;  Laterality: N/A;   TUBES IN EARS      FAMILY HISTORY: Family History  Problem Relation Age of Onset   Cancer Father        TONSIL    Hypertension Father    Diabetes Father    Cancer Paternal Aunt        SKIN   Heart disease Maternal Grandmother    Cancer Paternal Grandmother        LUNG AND LIVER   Stroke Paternal Grandmother    Diabetes Mother    Breast cancer Maternal Aunt 13    SOCIAL HISTORY:  Social History   Socioeconomic History   Marital status: Married    Spouse name: Not on file   Number of children: 1   Years of education: Associates degree   Highest education level: Not on file  Occupational History   Occupation: [employer]  Tobacco  Use   Smoking status: Former   Smokeless tobacco: Never   Tobacco comments:    Last smoked 5-19, just one cigarette  Vaping Use   Vaping Use: Former  Substance and Sexual Activity   Alcohol use: Yes    Comment: SOCIAL - WEEK ENDS    Drug use: No   Sexual activity: Yes    Birth control/protection: None  Other Topics Concern   Not on file  Social History Narrative   Lives with husband    Caffeine use: 2 per day   Right handed    Social Determinants of Health   Financial Resource Strain: Not on file  Food Insecurity: Not on file  Transportation Needs: Not on file  Physical Activity: Not on file  Stress: Not on file  Social Connections: Not on file  Intimate Partner Violence: Not on file     PHYSICAL EXAM  Vitals:   12/25/21 1324  BP: 103/69  Pulse: 93  Weight: 154 lb 9.6 oz (70.1 kg)  Height: 5\' 10"  (1.778 m)   No results found.   Body mass index is 22.18 kg/m.   General: The patient is well-developed and well-nourished and in no acute distress .   Skin: Extremities are without rash or edema.  Neurologic Exam  Mental status: The patient is alert and oriented x 3 at the time of the examination. The patient has apparent normal recent and remote memory, with an apparently normal attention span and concentration ability.   Speech is normal.  Cranial nerves: Extraocular movements are full.   Facial strength and sensation was normal.  Trapezius and sternocleidomastoid strength is normal. No dysarthria is noted.   . No obvious hearing deficits are noted.  Motor:  Muscle bulk is normal.   Tone is normal. Strength is 5/5  Sensory: Sensation to touch and vibration is symmetric now  Coordination: Cerebellar testing reveals good finger-nose-finger and bilateral heel-to-shin  Gait and station: Station is normal.  Gait is normal.  Tandem gait is mildly wide.  Romberg is borderline  Reflexes: Deep tendon reflexes are normal in the arms but increased in the legs, with  spread at the knees.  No ankle clonus.         Multiple sclerosis (HCC)  Cognitive deficit secondary to multiple sclerosis (HCC)  Attention deficit disorder (ADD) without hyperactivity  High risk medication use  Ataxic gait  Galactorrhea   1.  Continue Tysabri. JCV Ab is negative.   Check MRI brain 2.   Cognitive issues related to MS including attention deficit. Better with Adderall 10 - 20 mg po qAm .  3.   Take gabapentin nightly to help restless legs and sleep maintenance insomnia 4.   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   She repots blood work last month was normal.  I will recheck prolactin.    5.   rtc 6 months or sooner if new or worsening neurologic symptoms    Tecia Cinnamon A. Felecia Shelling, MD, [facility] 9/97/7414, 2:39 PM Certified in Neurology, Clinical Neurophysiology, Sleep Medicine, Pain Medicine and Neuroimaging  [facility] [address] [place], [place] [address] [phone]

## 2021-12-26 ENCOUNTER — Encounter: Payer: Self-pay | Admitting: Neurology

## 2021-12-26 ENCOUNTER — Other Ambulatory Visit: Payer: Self-pay | Admitting: Neurology

## 2021-12-26 DIAGNOSIS — D352 Benign neoplasm of pituitary gland: Secondary | ICD-10-CM

## 2021-12-26 DIAGNOSIS — G35 Multiple sclerosis: Secondary | ICD-10-CM

## 2021-12-26 LAB — PROLACTIN: Prolactin: 55.4 ng/mL — ABNORMAL HIGH (ref 4.8–23.3)

## 2021-12-27 ENCOUNTER — Ambulatory Visit
Admission: RE | Admit: 2021-12-27 | Discharge: 2021-12-27 | Disposition: A | Discharge status: Home / Self Care | Payer: BC Managed Care – PPO | Source: Ambulatory Visit | Attending: Family Medicine | Admitting: Family Medicine

## 2021-12-27 ENCOUNTER — Telehealth: Payer: Self-pay | Admitting: Neurology

## 2021-12-27 ENCOUNTER — Other Ambulatory Visit: Payer: Self-pay

## 2021-12-27 DIAGNOSIS — R922 Inconclusive mammogram: Secondary | ICD-10-CM | POA: Diagnosis not present

## 2021-12-27 DIAGNOSIS — N6452 Nipple discharge: Secondary | ICD-10-CM

## 2021-12-27 DIAGNOSIS — N6489 Other specified disorders of breast: Secondary | ICD-10-CM | POA: Diagnosis not present

## 2021-12-27 NOTE — Telephone Encounter (Signed)
MR Brain w/wo contrast Dr. Cheree Ditto Josem Kaufmann: [id number] (exp. 12/26/21 to 01/24/22). Patient is scheduled at [facility] for 01/09/22.

## 2021-12-29 DIAGNOSIS — B351 Tinea unguium: Secondary | ICD-10-CM | POA: Diagnosis not present

## 2021-12-29 DIAGNOSIS — L603 Nail dystrophy: Secondary | ICD-10-CM | POA: Diagnosis not present

## 2021-12-29 DIAGNOSIS — L600 Ingrowing nail: Secondary | ICD-10-CM | POA: Diagnosis not present

## 2022-01-06 ENCOUNTER — Other Ambulatory Visit: Payer: Self-pay | Admitting: Neurology

## 2022-01-09 ENCOUNTER — Ambulatory Visit: Payer: BC Managed Care – PPO

## 2022-01-09 ENCOUNTER — Other Ambulatory Visit: Payer: Self-pay

## 2022-01-09 DIAGNOSIS — D352 Benign neoplasm of pituitary gland: Secondary | ICD-10-CM | POA: Diagnosis not present

## 2022-01-09 DIAGNOSIS — G35 Multiple sclerosis: Secondary | ICD-10-CM

## 2022-01-09 MED ORDER — GADOBENATE DIMEGLUMINE 529 MG/ML IV SOLN
10.0000 mL | Freq: Once | INTRAVENOUS | Status: AC | PRN
  Administered 2022-01-09: 10 mL via INTRAVENOUS

## 2022-01-10 ENCOUNTER — Other Ambulatory Visit: Payer: Self-pay | Admitting: Neurology

## 2022-01-10 ENCOUNTER — Telehealth: Payer: Self-pay | Admitting: Neurology

## 2022-01-10 DIAGNOSIS — D352 Benign neoplasm of pituitary gland: Secondary | ICD-10-CM

## 2022-01-10 DIAGNOSIS — N643 Galactorrhea not associated with childbirth: Secondary | ICD-10-CM

## 2022-01-10 NOTE — Telephone Encounter (Signed)
The MRI of the brain with thin slices through the pituitary gland showed that her MS was stable with no new lesions.  She does have a small hypoenhancing focus in the right side of the pituitary gland (about 3 mm) that could be consistent with a pituitary microadenoma ? ?Her prolactin level was elevated.  I would recommend we refer her to endocrinology.  She might benefit from Dostinex or similar treatment ? ?I called and left a message. ?

## 2022-01-10 NOTE — Telephone Encounter (Signed)
I spoke to the patient about the MRI findings and her abnormal prolactin level. ? ?I would like her to see endocrinology for treatment of the prolactin secreting microadenoma ?

## 2022-01-16 ENCOUNTER — Other Ambulatory Visit: Payer: Self-pay | Admitting: *Deleted

## 2022-01-16 DIAGNOSIS — G35 Multiple sclerosis: Secondary | ICD-10-CM | POA: Diagnosis not present

## 2022-01-16 DIAGNOSIS — N643 Galactorrhea not associated with childbirth: Secondary | ICD-10-CM

## 2022-01-16 DIAGNOSIS — D352 Benign neoplasm of pituitary gland: Secondary | ICD-10-CM

## 2022-01-17 ENCOUNTER — Telehealth: Payer: Self-pay | Admitting: Neurology

## 2022-01-17 NOTE — Telephone Encounter (Signed)
Sent to [facility] ?Phone: [phone]  F: [phone] ?[address] ?[place], [place] [address] ? ?I marked it urgent for them to schedule as soon as possible.  ?

## 2022-01-23 ENCOUNTER — Other Ambulatory Visit: Payer: Self-pay | Admitting: Neurology

## 2022-01-23 DIAGNOSIS — D352 Benign neoplasm of pituitary gland: Secondary | ICD-10-CM

## 2022-01-23 MED ORDER — CABERGOLINE 0.5 MG PO TABS: 0.2500 mg | ORAL_TABLET | ORAL | 3 refills | Status: DC

## 2022-02-19 DIAGNOSIS — G35 Multiple sclerosis: Secondary | ICD-10-CM | POA: Diagnosis not present

## 2022-03-19 DIAGNOSIS — G35 Multiple sclerosis: Secondary | ICD-10-CM | POA: Diagnosis not present

## 2022-03-27 ENCOUNTER — Other Ambulatory Visit: Payer: Self-pay | Admitting: Neurology

## 2022-03-27 MED ORDER — AMPHETAMINE-DEXTROAMPHETAMINE 10 MG PO TABS: ORAL_TABLET | ORAL | 0 refills | Status: DC

## 2022-03-27 NOTE — Telephone Encounter (Signed)
Patient last seen 12/25/21, no upcoming appt scheduled. Due around 06/24/22 for appt. I called her and scheduled 6 month f/u for 06/27/22 at 3:00pm with Dr. Felecia Shelling.   Checked drug registry. Last refilled adderall 01/08/22 #60.   She has not seen endocrinology yet, initial consult scheduled for 04/20/22. Confirmed she is still taking below med. She is tolerating well and sx have resolved. She will call if she develops any new or worsening sx.  Cabergoline (Tab) DOSTINEX 0.5 MG Take 0.5 tablets (0.25 mg total) by mouth 2 (two) times a week.

## 2022-03-27 NOTE — Telephone Encounter (Signed)
Pt request refill foramphetamine-dextroamphetamine (ADDERALL) 10 MG tablet  at [facility] #[id number]

## 2022-04-16 ENCOUNTER — Encounter: Payer: Self-pay | Admitting: Neurology

## 2022-04-16 DIAGNOSIS — G35 Multiple sclerosis: Secondary | ICD-10-CM | POA: Diagnosis not present

## 2022-04-20 DIAGNOSIS — D352 Benign neoplasm of pituitary gland: Secondary | ICD-10-CM | POA: Diagnosis not present

## 2022-05-10 ENCOUNTER — Other Ambulatory Visit: Payer: Self-pay | Admitting: Neurology

## 2022-05-10 MED ORDER — AMPHETAMINE-DEXTROAMPHETAMINE 10 MG PO TABS: ORAL_TABLET | ORAL | 0 refills | Status: DC

## 2022-05-14 ENCOUNTER — Encounter: Payer: Self-pay | Admitting: Neurology

## 2022-05-14 ENCOUNTER — Telehealth: Payer: Self-pay | Admitting: Neurology

## 2022-05-14 ENCOUNTER — Other Ambulatory Visit: Payer: Self-pay | Admitting: Neurology

## 2022-05-14 ENCOUNTER — Other Ambulatory Visit (INDEPENDENT_AMBULATORY_CARE_PROVIDER_SITE_OTHER): Payer: Self-pay

## 2022-05-14 DIAGNOSIS — Z0289 Encounter for other administrative examinations: Secondary | ICD-10-CM

## 2022-05-14 DIAGNOSIS — G35 Multiple sclerosis: Secondary | ICD-10-CM

## 2022-05-14 DIAGNOSIS — Z79899 Other long term (current) drug therapy: Secondary | ICD-10-CM

## 2022-05-14 DIAGNOSIS — G35D Multiple sclerosis, unspecified: Secondary | ICD-10-CM

## 2022-05-14 NOTE — Telephone Encounter (Signed)
Placed [facility] lab in [facility] lock box for routine lab pick up. Results pending. 

## 2022-05-15 LAB — CBC WITH DIFFERENTIAL/PLATELET
Basophils Absolute: 0.1 10*3/uL (ref 0.0–0.2)
Basos: 1 %
EOS (ABSOLUTE): 0.2 10*3/uL (ref 0.0–0.4)
Eos: 2 %
Hematocrit: 37.3 % (ref 34.0–46.6)
Hemoglobin: 12.4 g/dL (ref 11.1–15.9)
Immature Grans (Abs): 0 10*3/uL (ref 0.0–0.1)
Immature Granulocytes: 0 %
Lymphocytes Absolute: 3.1 10*3/uL (ref 0.7–3.1)
Lymphs: 34 %
MCH: 30.8 pg (ref 26.6–33.0)
MCHC: 33.2 g/dL (ref 31.5–35.7)
MCV: 93 fL (ref 79–97)
Monocytes Absolute: 1.6 10*3/uL — ABNORMAL HIGH (ref 0.1–0.9)
Monocytes: 17 %
Neutrophils Absolute: 4.3 10*3/uL (ref 1.4–7.0)
Neutrophils: 46 %
Platelets: 231 10*3/uL (ref 150–450)
RBC: 4.03 x10E6/uL (ref 3.77–5.28)
RDW: 12.9 % (ref 11.7–15.4)
WBC: 9.2 10*3/uL (ref 3.4–10.8)

## 2022-05-21 NOTE — Telephone Encounter (Signed)
JCV ab drawn on 05/14/22 indeterminate, index: 0.20. Inhibition assay: negative.

## 2022-05-22 DIAGNOSIS — D352 Benign neoplasm of pituitary gland: Secondary | ICD-10-CM | POA: Diagnosis not present

## 2022-06-08 DIAGNOSIS — Z01419 Encounter for gynecological examination (general) (routine) without abnormal findings: Secondary | ICD-10-CM | POA: Diagnosis not present

## 2022-06-08 DIAGNOSIS — Z113 Encounter for screening for infections with a predominantly sexual mode of transmission: Secondary | ICD-10-CM | POA: Diagnosis not present

## 2022-06-08 DIAGNOSIS — N760 Acute vaginitis: Secondary | ICD-10-CM | POA: Diagnosis not present

## 2022-06-11 DIAGNOSIS — G35 Multiple sclerosis: Secondary | ICD-10-CM | POA: Diagnosis not present

## 2022-06-18 ENCOUNTER — Other Ambulatory Visit: Payer: Self-pay | Admitting: Neurology

## 2022-06-20 MED ORDER — AMPHETAMINE-DEXTROAMPHETAMINE 10 MG PO TABS: ORAL_TABLET | ORAL | 0 refills | Status: DC

## 2022-06-20 NOTE — Telephone Encounter (Signed)
Last OV was on 12/25/21.  Next OV is scheduled for 06/27/22.  Last RX was written on 05/11/22 for 60 tabs.   [place] [facility] Database has been reviewed.

## 2022-06-27 ENCOUNTER — Encounter: Payer: Self-pay | Admitting: Neurology

## 2022-06-27 ENCOUNTER — Ambulatory Visit (INDEPENDENT_AMBULATORY_CARE_PROVIDER_SITE_OTHER): Payer: BC Managed Care – PPO | Admitting: Neurology

## 2022-06-27 ENCOUNTER — Telehealth: Payer: Self-pay

## 2022-06-27 VITALS — BP 113/71 | HR 90 | Ht 70.0 in | Wt 149.5 lb

## 2022-06-27 DIAGNOSIS — F32A Depression, unspecified: Secondary | ICD-10-CM | POA: Diagnosis not present

## 2022-06-27 DIAGNOSIS — F988 Other specified behavioral and emotional disorders with onset usually occurring in childhood and adolescence: Secondary | ICD-10-CM

## 2022-06-27 DIAGNOSIS — Z79899 Other long term (current) drug therapy: Secondary | ICD-10-CM

## 2022-06-27 DIAGNOSIS — D352 Benign neoplasm of pituitary gland: Secondary | ICD-10-CM | POA: Diagnosis not present

## 2022-06-27 DIAGNOSIS — R200 Anesthesia of skin: Secondary | ICD-10-CM

## 2022-06-27 DIAGNOSIS — G35 Multiple sclerosis: Secondary | ICD-10-CM | POA: Diagnosis not present

## 2022-06-27 NOTE — Telephone Encounter (Signed)
JCV collected and place outside in [place] box for pickup.

## 2022-06-27 NOTE — Progress Notes (Signed)
[facility]  PATIENT: Chloe Pittman DOB: May 06, 1986  REFERRING DOCTOR OR PCP: Dineen Kid, MD SOURCE: Patient, emergency room/hospital notes, imaging and lab reports, multiple MRI images personally reviewed on PACS.  _________________________________   HISTORICAL  CHIEF COMPLAINT:  Chief Complaint  Patient presents with   Follow-up    Rm 1, alone. Here for 6 month MS f/u, on Tysabri and tolerating well. Last infusion date: 06/11/2022 and Next infusion date: 07/16/2022. Has noticed tingling sensation going up R leg, mostly noticeable at night. Not intense or painful.     HISTORY OF PRESENT ILLNESS:  Chloe Pittman is a 36 y.o. woman with relapsing remitting multiple sclerosis.   Update 06/21/2021: She is on Tysabri and tolerates it well.  Last JCV 11/21/2021 and was negative 0.14.       She feels stable for the most part but has some fluctuating paresthesias in her right leg.  This is not painful and is not intense.   That leg had numbness and weakness with her initial MS exacerbation leading to diagnosis.  She denies weakness.  Balance is slightly off but she has no falls.  Balance is sometimes a little bit worse in the heat.  The right leg sometimes goes mildly spastic and has a right foot drop, especially if hot.  Bladder function is fine.  Vision is fine.  She is on cabergline for the pituitary tumor.  Prolactin got too low and it was reduced from biw to once a week.  She is doing better with no lactation or menstrual issues.  She is on sertraline 100 mg .  She still feels moody at times but notes it does help.   She has some depression.   She continues Adderall for attention.  She is still sometimes forgetful.   Seh works as a Copywriter, advertising.    Adderall has helped her focus/attention some.   Most days she takes 15 or 20 mg in am of the immediate release.  She notes she is more fatigued with worse cognition when she does not take it.    MS HISTORY She developed  numbness in her right side 12/2018.    She did not note much clumsiness initially.  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   Symptoms were worse the next day when she had intense numbness and also right arm clumsiness.  She presented to the ED.   She noted more numbness while in the hospital, especially from the breast down.    MRI's 12/18/2018 were c/w MS with 1-2 right temporal enhancing brain lesions and several non-enhancing cervical spine lesions but no definite thoracic spine lesions.  She received 5 days of IV Solumedrol and improved.  I saw her shortly after the hospital stay and we started Tysabri in March 2020   She has no FH of MS.  Her sister has psoriasis.  IMAGING MRI of the brain 01/09/2022 showed Multiple T2/FLAIR hyperintense foci in the hemispheres, left thalamus, left cerebellum and spinal cord in a pattern consistent with chronic demyelinating plaque associated with multiple sclerosis.  None of the foci enhance or appear to be acute.  Compared to the MRI dated 07/19/2021, there are no new lesions.    3 mm hypoenhancing focus in the right pituitary hemisphere consistent with a pituitary microadenoma. No acute findings.   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   MRI of the brain 07/19/2021 showed T2/FLAIR hyperintense foci in the cerebral hemispheres and left cerebellar hemisphere.  The pattern and configuration are consistent with chronic demyelinating plaques associated with multiple sclerosis.  None of the foci appear to be acute.  They do not enhance.  Compared to the MRI dated 06/21/2020, there are no new lesions.   Pituitary appeared normal.    MRI brain 06/21/2020 shows T2/FLAIR hyperintense foci in the hemispheres and cerebellum in a pattern and configuration consistent with chronic demyelinating plaque associated with multiple sclerosis.  None of the foci appear to be acute and  they do not enhance.  Compared to the MRI from 06/16/2019, there are no new lesions.  MRI of the brain 12/18/2018 shows multiple T2/flair hyperintense foci, many in the periventricular and juxtacortical white matter.  There are also a couple foci in the left cerebellar hemisphere and left middle cerebellar peduncle.  About 3 foci enhanced after contrast.    MRI of the cervical spine shows multiple lesions, to the right adjacent to C2-C3, dorsally towards the right adjacent to C4, centrally, little more to the left adjacent to C6.    LabsVitamin D was 28.9, HIV was negative.  REVIEW OF SYSTEMS: Constitutional: No fevers, chills, sweats, or change in appetite.  She has fatigue Eyes: No visual changes, double vision, eye pain Ear, nose and throat: No hearing loss, ear pain, nasal congestion, sore throat Cardiovascular: No chest pain, palpitations Respiratory:  No shortness of breath at rest or with exertion.   No wheezes GastrointestinaI: No nausea, vomiting, diarrhea, abdominal pain, fecal incontinence Genitourinary:  No dysuria, urinary retention or frequency.  No nocturia. Musculoskeletal:  No neck pain, back pain Integumentary: No rash, pruritus, skin lesions Neurological: as above Psychiatric: No depression at this time.  No anxiety Endocrine: No palpitations, diaphoresis, change in appetite, change in weigh or increased thirst Hematologic/Lymphatic:  No anemia, purpura, petechiae. Allergic/Immunologic: No itchy/runny eyes, nasal congestion, recent allergic reactions, rashes  ALLERGIES: Allergies  Allergen Reactions   Sulfa Antibiotics Nausea Only    HOME MEDICATIONS:  Current Outpatient Medications:    amphetamine-dextroamphetamine (ADDERALL) 10 MG tablet, 1 tablet in the morning. May take additional tablet at noon if necessary, Disp: 60 tablet, Rfl: 0   cabergoline (DOSTINEX) 0.5 MG tablet, Take 0.5 tablets (0.25 mg total) by mouth 2 (two) times a week., Disp: 24 tablet, Rfl: 3    gabapentin (NEURONTIN) 300 MG capsule, Take 1-3 capsules (300-900 mg total) by mouth at bedtime as needed., Disp: 270 capsule, Rfl: 0   ibuprofen (ADVIL) 200 MG tablet, Take 200 mg by mouth every 6 (six) hours as needed., Disp: , Rfl:    natalizumab (TYSABRI) 300 MG/15ML injection, Inject 300 mg into the vein every 28 (twenty-eight) days., Disp: , Rfl:    sertraline (ZOLOFT) 25 MG tablet, Take 25 mg by mouth daily., Disp: , Rfl:   PAST MEDICAL HISTORY: Past Medical History:  Diagnosis Date   Hx of varicella    Medical history non-contributory    MS (multiple sclerosis) ([place])     PAST SURGICAL HISTORY: Past Surgical History:  Procedure Laterality Date   FACIAL COSMETIC SURGERY     dog bite   HEMORRHOID SURGERY N/A 05/14/2018   Procedure: HEMORRHOIDECTOMY;  Surgeon: Michael Boston, MD;  Location: [facility];  Service: General;  Laterality: N/A;   INNER EAR SURGERY     PEXY N/A 05/14/2018   Procedure: HEMORRHOIDAL LIGATION/PEXY;  Surgeon: Michael Boston, MD;  Location: [facility];  Service: General;  Laterality: N/A;   RECTAL EXAM UNDER ANESTHESIA N/A 05/14/2018   Procedure: ANORECTAL EXAM UNDER ANESTHESIA;  Surgeon: Michael Boston, MD;  Location: [facility];  Service: General;  Laterality: N/A;   TUBES IN EARS      FAMILY HISTORY: Family History  Problem Relation Age of Onset   Cancer Father        TONSIL    Hypertension Father    Diabetes Father    Cancer Paternal Aunt        SKIN   Heart disease Maternal Grandmother    Cancer Paternal Grandmother        LUNG AND LIVER   Stroke Paternal Grandmother    Diabetes Mother    Breast cancer Maternal Aunt 19    SOCIAL HISTORY:  Social History   Socioeconomic History   Marital status: Married    Spouse name: Not on file   Number of children: 1   Years of education: Associates degree   Highest education level: Not on file  Occupational History   Occupation: [employer]. [employer]  Tobacco Use   Smoking status: Former   Smokeless tobacco:  Never   Tobacco comments:    Last smoked 5-19, just one cigarette  Vaping Use   Vaping Use: Former  Substance and Sexual Activity   Alcohol use: Yes    Comment: SOCIAL - WEEK ENDS    Drug use: No   Sexual activity: Yes    Birth control/protection: None  Other Topics Concern   Not on file  Social History Narrative   Lives with husband    Caffeine use: 2 per day   Right handed    Social Determinants of Health   Financial Resource Strain: Not on file  Food Insecurity: Not on file  Transportation Needs: Not on file  Physical Activity: Not on file  Stress: Not on file  Social Connections: Not on file  Intimate Partner Violence: Not on file     PHYSICAL EXAM  Vitals:   06/27/22 1456  BP: 113/71  Pulse: 90  Weight: 149 lb 8 oz (67.8 kg)  Height: '5\' 10"'$  (1.778 m)   No results found.   Body mass index is 21.45 kg/m.   General: The patient is well-developed and well-nourished and in no acute distress .   Skin: Extremities are without rash or edema.  Neurologic Exam  Mental status: The patient is alert and oriented x 3 at the time of the examination. The patient has apparent normal recent and remote memory, with an apparently normal attention span and concentration ability.   Speech is normal.  Cranial nerves: Extraocular movements are full.   Facial strength and sensation was normal.  Trapezius and sternocleidomastoid strength is normal. No dysarthria is noted.   . No obvious hearing deficits are noted.  Motor:  Muscle bulk is normal.   Tone is normal. Strength is 5/5  Sensory: Sensation to touch and vibration is symmetric now  Coordination: Cerebellar testing reveals good finger-nose-finger and bilateral heel-to-shin  Gait and station: Station is normal.  Gait is normal.  No foot drop today noted tandem gait is mildly wide.  Romberg is borderline  Reflexes: Deep tendon reflexes are normal in the arms but increased in the legs, with spread at the knees.  No ankle  clonus.         Multiple sclerosis ([place]) - Plan: Stratify JCV Antibody Test ([facility]), CBC with Differential/Platelet, Ambulatory referral to [facility], CANCELED: Ambulatory  referral to [facility]  Prolactin secreting pituitary adenoma (HCC)  Depression, unspecified depression type - Plan: Ambulatory referral to [place], CANCELED: Ambulatory referral to [place]  High risk medication use - Plan: Stratify JCV Antibody Test ([facility]), CBC with Differential/Platelet  Numbness  Attention deficit disorder (ADD) without hyperactivity   1.  Continue Tysabri. JCV Ab has been negative.    We will check again today. 2.   Cognitive issues related to MS including attention deficit. Better with Adderall 10 - 20 mg po qAm .  She feels more depressed despite being on sertraline 100 mg daily.  We discussed having her see counseling and she would like to proceed with this.  I will send in a referral.  If depression worsens further consider adding bupropion. 3.   Take gabapentin nightly to help restless legs and sleep maintenance insomnia 4.   Continue cabergoline for prolactinoma.   5.   rtc 6 months or sooner if new or worsening neurologic symptoms    Aqeel Norgaard A. Felecia Shelling, MD, [facility] 2/50/5397, 6:73 PM Certified in Neurology, Clinical Neurophysiology, Sleep Medicine, Pain Medicine and Neuroimaging  [facility] [address] [place], [place] [address] [phone]

## 2022-06-28 LAB — CBC WITH DIFFERENTIAL/PLATELET
Basophils Absolute: 0.1 10*3/uL (ref 0.0–0.2)
Basos: 1 %
EOS (ABSOLUTE): 0.4 10*3/uL (ref 0.0–0.4)
Eos: 4 %
Hematocrit: 37.8 % (ref 34.0–46.6)
Hemoglobin: 12.6 g/dL (ref 11.1–15.9)
Immature Grans (Abs): 0 10*3/uL (ref 0.0–0.1)
Immature Granulocytes: 0 %
Lymphocytes Absolute: 4.2 10*3/uL — ABNORMAL HIGH (ref 0.7–3.1)
Lymphs: 47 %
MCH: 30.7 pg (ref 26.6–33.0)
MCHC: 33.3 g/dL (ref 31.5–35.7)
MCV: 92 fL (ref 79–97)
Monocytes Absolute: 1 10*3/uL — ABNORMAL HIGH (ref 0.1–0.9)
Monocytes: 11 %
Neutrophils Absolute: 3.3 10*3/uL (ref 1.4–7.0)
Neutrophils: 37 %
Platelets: 259 10*3/uL (ref 150–450)
RBC: 4.11 x10E6/uL (ref 3.77–5.28)
RDW: 12.7 % (ref 11.7–15.4)
WBC: 9 10*3/uL (ref 3.4–10.8)

## 2022-07-10 DIAGNOSIS — H65112 Acute and subacute allergic otitis media (mucoid) (sanguinous) (serous), left ear: Secondary | ICD-10-CM | POA: Diagnosis not present

## 2022-07-10 DIAGNOSIS — Z20822 Contact with and (suspected) exposure to covid-19: Secondary | ICD-10-CM | POA: Diagnosis not present

## 2022-07-10 DIAGNOSIS — J029 Acute pharyngitis, unspecified: Secondary | ICD-10-CM | POA: Diagnosis not present

## 2022-07-10 NOTE — Telephone Encounter (Signed)
JCV ab drawn on 06/27/22 negative, index: 0.14

## 2022-07-16 DIAGNOSIS — G35 Multiple sclerosis: Secondary | ICD-10-CM | POA: Diagnosis not present

## 2022-07-23 ENCOUNTER — Other Ambulatory Visit: Payer: Self-pay | Admitting: Neurology

## 2022-07-26 MED ORDER — AMPHETAMINE-DEXTROAMPHETAMINE 10 MG PO TABS: ORAL_TABLET | ORAL | 0 refills | Status: DC

## 2022-07-27 ENCOUNTER — Other Ambulatory Visit: Payer: Self-pay | Admitting: Psychiatry

## 2022-07-27 MED ORDER — AMPHETAMINE-DEXTROAMPHETAMINE 10 MG PO TABS: ORAL_TABLET | ORAL | 0 refills | Status: DC

## 2022-08-13 DIAGNOSIS — G35 Multiple sclerosis: Secondary | ICD-10-CM | POA: Diagnosis not present

## 2022-08-14 DIAGNOSIS — D352 Benign neoplasm of pituitary gland: Secondary | ICD-10-CM | POA: Diagnosis not present

## 2022-08-29 ENCOUNTER — Other Ambulatory Visit: Payer: Self-pay | Admitting: Psychiatry

## 2022-08-29 MED ORDER — AMPHETAMINE-DEXTROAMPHETAMINE 10 MG PO TABS: ORAL_TABLET | ORAL | 0 refills | Status: DC

## 2022-09-04 DIAGNOSIS — D352 Benign neoplasm of pituitary gland: Secondary | ICD-10-CM | POA: Diagnosis not present

## 2022-09-04 DIAGNOSIS — G35 Multiple sclerosis: Secondary | ICD-10-CM | POA: Diagnosis not present

## 2022-09-06 ENCOUNTER — Encounter: Payer: Self-pay | Admitting: Neurology

## 2022-09-06 ENCOUNTER — Ambulatory Visit (INDEPENDENT_AMBULATORY_CARE_PROVIDER_SITE_OTHER): Payer: BC Managed Care – PPO | Admitting: Neurology

## 2022-09-06 VITALS — BP 116/64 | HR 97 | Ht 70.0 in | Wt 150.0 lb

## 2022-09-06 DIAGNOSIS — G35 Multiple sclerosis: Secondary | ICD-10-CM

## 2022-09-06 DIAGNOSIS — R200 Anesthesia of skin: Secondary | ICD-10-CM | POA: Diagnosis not present

## 2022-09-06 DIAGNOSIS — Z79899 Other long term (current) drug therapy: Secondary | ICD-10-CM | POA: Diagnosis not present

## 2022-09-06 DIAGNOSIS — D352 Benign neoplasm of pituitary gland: Secondary | ICD-10-CM

## 2022-09-06 DIAGNOSIS — F988 Other specified behavioral and emotional disorders with onset usually occurring in childhood and adolescence: Secondary | ICD-10-CM

## 2022-09-06 MED ORDER — AMPHETAMINE-DEXTROAMPHETAMINE 15 MG PO TABS: ORAL_TABLET | ORAL | 0 refills | Status: DC

## 2022-09-06 MED ORDER — ARIPIPRAZOLE 2 MG PO TABS: 2.0000 mg | ORAL_TABLET | Freq: Every day | ORAL | 11 refills | Status: DC

## 2022-09-06 NOTE — Progress Notes (Signed)
[facility]  PATIENT: Chloe Pittman DOB: Nov 25, 1985  REFERRING DOCTOR OR PCP: Dineen Kid, MD SOURCE: Patient, emergency room/hospital notes, imaging and lab reports, multiple MRI images personally reviewed on PACS.  _________________________________   HISTORICAL  CHIEF COMPLAINT:  Chief Complaint  Patient presents with   Follow-up    Pt alone, rm 1. DGL:OVFIEPP  last 08/13/22 next is scheduled 11/6 at [facility]. Overall stable. ? To have blood work completed today. Last JCV    HISTORY OF PRESENT ILLNESS:  Chloe Pittman is a 36 y.o. woman with relapsing remitting multiple sclerosis.   Update 09/06/2022: She is on Tysabri and tolerates it well.  Last JCV 06/27/2022 and was negative 0.14.       Gait and balance are stable - sometimes she is a little off balanced.  She uses the bannister on stairs.   She had one fall on stairs (slipped).  has ut has some fluctuating paresthesias in her right leg.  This is not painful and is not intense.   That leg had numbness and weakness with her initial MS exacerbation leading to diagnosis.  She denies weakness.  Balance is slightly off but she has no falls.  Balance is sometimes a little bit worse in the heat.  The right leg sometimes goes mildly spastic and has a right foot drop, especially if hot.  Bladder function is fine.  Vision is stable - occasional has a scintillating in her vision but she never loses vision.   Sometimes if bends over ad gets back up she has a mild blackening of vision for seconds..  She is on cabergline for the pituitary tumor.  She is down to 1/2 pill once a week.  She is doing better with no lactation or menstrual issues.  She is on sertraline 100 mg with benefit for depression.   She continues Adderall for attention - would like to try a higher dose.   Adderall has helped her focus/attention some.    She notes she is more fatigued with worse cognition when she does not take it.   She works as a Investment banker, operational.     [place] She developed numbness in her right side 12/2018.    She did not note much clumsiness initially.  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   Symptoms were worse the next day when she had intense numbness and also right arm clumsiness.  She presented to the ED.   She noted more numbness while in the hospital, especially from the breast down.    MRI's 12/18/2018 were c/w MS with 1-2 right temporal enhancing brain lesions and several non-enhancing cervical spine lesions but no definite thoracic spine lesions.  She received 5 days of IV Solumedrol and improved.  I saw her shortly after the hospital stay and we started Tysabri in March 2020   She has no FH of MS.  Her sister has psoriasis.  IMAGING MRI of the brain 01/09/2022 showed Multiple T2/FLAIR hyperintense foci in the hemispheres, left thalamus, left cerebellum and spinal cord in a pattern consistent with chronic demyelinating plaque associated with multiple sclerosis.  None of the foci enhance or appear to be acute.  Compared to the MRI dated 07/19/2021, there are no new lesions.    3 mm hypoenhancing focus in the right pituitary hemisphere consistent with a pituitary microadenoma. No acute findings.   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   MRI of the brain 07/19/2021 showed T2/FLAIR hyperintense foci in the cerebral hemispheres and left cerebellar hemisphere.  The pattern and configuration are consistent with chronic demyelinating plaques associated with multiple sclerosis.  None of the foci appear to be acute.  They do not enhance.  Compared to the MRI dated 06/21/2020, there are no new lesions.   Pituitary appeared normal.    MRI brain 06/21/2020 shows T2/FLAIR hyperintense foci in the hemispheres and cerebellum in a pattern and configuration consistent with chronic demyelinating plaque associated with multiple  sclerosis.  None of the foci appear to be acute and they do not enhance.  Compared to the MRI from 06/16/2019, there are no new lesions.  MRI of the brain 12/18/2018 shows multiple T2/flair hyperintense foci, many in the periventricular and juxtacortical white matter.  There are also a couple foci in the left cerebellar hemisphere and left middle cerebellar peduncle.  About 3 foci enhanced after contrast.    MRI of the cervical spine shows multiple lesions, to the right adjacent to C2-C3, dorsally towards the right adjacent to C4, centrally, little more to the left adjacent to C6.    LabsVitamin D was 28.9, HIV was negative.  REVIEW OF SYSTEMS: Constitutional: No fevers, chills, sweats, or change in appetite.  She has fatigue Eyes: No visual changes, double vision, eye pain Ear, nose and throat: No hearing loss, ear pain, nasal congestion, sore throat Cardiovascular: No chest pain, palpitations Respiratory:  No shortness of breath at rest or with exertion.   No wheezes GastrointestinaI: No nausea, vomiting, diarrhea, abdominal pain, fecal incontinence Genitourinary:  No dysuria, urinary retention or frequency.  No nocturia. Musculoskeletal:  No neck pain, back pain Integumentary: No rash, pruritus, skin lesions Neurological: as above Psychiatric: No depression at this time.  No anxiety Endocrine: No palpitations, diaphoresis, change in appetite, change in weigh or increased thirst Hematologic/Lymphatic:  No anemia, purpura, petechiae. Allergic/Immunologic: No itchy/runny eyes, nasal congestion, recent allergic reactions, rashes  ALLERGIES: Allergies  Allergen Reactions   Sulfa Antibiotics Nausea Only    HOME MEDICATIONS:  Current Outpatient Medications:    ARIPiprazole (ABILIFY) 2 MG tablet, Take 1 tablet (2 mg total) by mouth daily., Disp: 30 tablet, Rfl: 11   cabergoline (DOSTINEX) 0.5 MG tablet, Take 0.5 tablets (0.25 mg total) by mouth 2 (two) times a week., Disp: 24 tablet, Rfl:  3   ibuprofen (ADVIL) 200 MG tablet, Take 200 mg by mouth every 6 (six) hours as needed., Disp: , Rfl:    natalizumab (TYSABRI) 300 MG/15ML injection, Inject 300 mg into the vein every 28 (twenty-eight) days., Disp: , Rfl:    sertraline (ZOLOFT) 25 MG tablet, Take 25 mg by mouth daily., Disp: , Rfl:    amphetamine-dextroamphetamine (ADDERALL) 15 MG tablet, 1 tablet in the morning. May take additional tablet at noon if necessary, Disp: 60 tablet, Rfl: 0  PAST MEDICAL HISTORY: Past Medical History:  Diagnosis Date   Hx of varicella    Medical history non-contributory    MS (multiple sclerosis) ([place])     PAST SURGICAL HISTORY: Past Surgical History:  Procedure Laterality Date   FACIAL COSMETIC SURGERY     dog bite   HEMORRHOID SURGERY N/A 05/14/2018   Procedure: HEMORRHOIDECTOMY;  Surgeon: Michael Boston, MD;  Location: [facility];  Service: General;  Laterality: N/A;   INNER EAR SURGERY     PEXY N/A 05/14/2018   Procedure: HEMORRHOIDAL LIGATION/PEXY;  Surgeon:  Michael Boston, MD;  Location: [facility];  Service: General;  Laterality: N/A;   RECTAL EXAM UNDER ANESTHESIA N/A 05/14/2018   Procedure: ANORECTAL EXAM UNDER ANESTHESIA;  Surgeon: Michael Boston, MD;  Location: [facility];  Service: General;  Laterality: N/A;   TUBES IN EARS      FAMILY HISTORY: Family History  Problem Relation Age of Onset   Cancer Father        TONSIL    Hypertension Father    Diabetes Father    Cancer Paternal Aunt        SKIN   Heart disease Maternal Grandmother    Cancer Paternal Grandmother        LUNG AND LIVER   Stroke Paternal Grandmother    Diabetes Mother    Breast cancer Maternal Aunt 24    SOCIAL HISTORY:  Social History   Socioeconomic History   Marital status: Married    Spouse name: Not on file   Number of children: 1   Years of education: Associates degree   Highest education level: Not on file  Occupational History   Occupation: [employer]. [employer]  Tobacco Use   Smoking status:  Former   Smokeless tobacco: Never   Tobacco comments:    Last smoked 5-19, just one cigarette  Vaping Use   Vaping Use: Former  Substance and Sexual Activity   Alcohol use: Yes    Comment: SOCIAL - WEEK ENDS    Drug use: No   Sexual activity: Yes    Birth control/protection: None  Other Topics Concern   Not on file  Social History Narrative   Lives with husband    Caffeine use: 2 per day   Right handed    Social Determinants of Health   Financial Resource Strain: Not on file  Food Insecurity: Not on file  Transportation Needs: Not on file  Physical Activity: Not on file  Stress: Not on file  Social Connections: Not on file  Intimate Partner Violence: Not on file     PHYSICAL EXAM  Vitals:   09/06/22 1053  BP: 116/64  Pulse: 97  Weight: 150 lb (68 kg)  Height: '5\' 10"'$  (1.778 m)   No results found.   Body mass index is 21.52 kg/m.   General: The patient is well-developed and well-nourished and in no acute distress .   Skin: Extremities are without rash or edema.  Neurologic Exam  Mental status: The patient is alert and oriented x 3 at the time of the examination. The patient has apparent normal recent and remote memory, with an apparently normal attention span and concentration ability.   Speech is normal.  Cranial nerves: Extraocular movements are full.   Facial strength and sensation was normal.  Trapezius and sternocleidomastoid strength is normal. No dysarthria is noted.   . No obvious hearing deficits are noted.  Motor:  Muscle bulk is normal.   Tone is normal. Strength is 5/5  Sensory: Sensation to touch and vibration is symmetric now  Coordination: Cerebellar testing reveals good finger-nose-finger and bilateral heel-to-shin  Gait and station: Station is normal.  Gait is normal.  The tandem gait is mildly wide.  Romberg is borderline.  Reflexes: Deep tendon reflexes are normal in the arms but increased in the legs, with spread at the knees.  No  ankle clonus.         Multiple sclerosis ([place]) - Plan: MR BRAIN W WO CONTRAST, CANCELED: HSV(herpes simplex vrs) 1+2 ab-IgG  High risk medication  use - Plan: CANCELED: HSV(herpes simplex vrs) 1+2 ab-IgG  Numbness  Pituitary microadenoma (HCC) - Plan: Prolactin, MR BRAIN W WO CONTRAST  Attention deficit disorder (ADD) without hyperactivity   1.  Continue Tysabri. JCV Ab has been negative.   2.   Increase Adderall to 15 mg po qAM and qnoon..   3.   She feels some agitation despite sertraline 100 mg daily.  We will add low dose Abilify and she is going to consider counseling with husband 4.    Take gabapentin nightly to help restless legs and sleep maintenance insomnia 5.   Continue cabergoline for prolactinoma.    Check prolactin today 6.   rtc 6 months or sooner if new or worsening neurologic symptoms    Deaven Barron A. Felecia Shelling, MD, [facility] 58/06/3253, 9:82 PM Certified in Neurology, Clinical Neurophysiology, Sleep Medicine, Pain Medicine and Neuroimaging  [facility] [address] [place], [place] [address] [phone]

## 2022-09-07 LAB — PROLACTIN: Prolactin: 6.9 ng/mL (ref 4.8–23.3)

## 2022-09-10 DIAGNOSIS — G35 Multiple sclerosis: Secondary | ICD-10-CM | POA: Diagnosis not present

## 2022-09-10 DIAGNOSIS — L989 Disorder of the skin and subcutaneous tissue, unspecified: Secondary | ICD-10-CM | POA: Diagnosis not present

## 2022-09-21 DIAGNOSIS — N920 Excessive and frequent menstruation with regular cycle: Secondary | ICD-10-CM | POA: Diagnosis not present

## 2022-09-21 DIAGNOSIS — Z6822 Body mass index (BMI) 22.0-22.9, adult: Secondary | ICD-10-CM | POA: Diagnosis not present

## 2022-10-11 DIAGNOSIS — H90A32 Mixed conductive and sensorineural hearing loss, unilateral, left ear with restricted hearing on the contralateral side: Secondary | ICD-10-CM | POA: Diagnosis not present

## 2022-10-11 DIAGNOSIS — J358 Other chronic diseases of tonsils and adenoids: Secondary | ICD-10-CM | POA: Diagnosis not present

## 2022-10-11 DIAGNOSIS — H6992 Unspecified Eustachian tube disorder, left ear: Secondary | ICD-10-CM | POA: Diagnosis not present

## 2022-10-11 DIAGNOSIS — Z9889 Other specified postprocedural states: Secondary | ICD-10-CM | POA: Diagnosis not present

## 2022-10-14 DIAGNOSIS — X58XXXA Exposure to other specified factors, initial encounter: Secondary | ICD-10-CM | POA: Diagnosis not present

## 2022-10-14 DIAGNOSIS — S0501XA Injury of conjunctiva and corneal abrasion without foreign body, right eye, initial encounter: Secondary | ICD-10-CM | POA: Diagnosis not present

## 2022-10-15 DIAGNOSIS — G35 Multiple sclerosis: Secondary | ICD-10-CM | POA: Diagnosis not present

## 2022-11-06 ENCOUNTER — Other Ambulatory Visit: Payer: Self-pay | Admitting: Neurology

## 2022-11-06 MED ORDER — AMPHETAMINE-DEXTROAMPHETAMINE 15 MG PO TABS: ORAL_TABLET | ORAL | 0 refills | Status: DC

## 2022-11-06 NOTE — Telephone Encounter (Signed)
Pls advise patient to make a FU appt with Dr. Felecia Shelling for May 2024 as planned.

## 2022-11-08 DIAGNOSIS — H04123 Dry eye syndrome of bilateral lacrimal glands: Secondary | ICD-10-CM | POA: Diagnosis not present

## 2022-11-08 DIAGNOSIS — H18831 Recurrent erosion of cornea, right eye: Secondary | ICD-10-CM | POA: Diagnosis not present

## 2022-11-19 ENCOUNTER — Other Ambulatory Visit: Payer: Self-pay | Admitting: *Deleted

## 2022-11-19 ENCOUNTER — Telehealth: Payer: Self-pay | Admitting: *Deleted

## 2022-11-19 ENCOUNTER — Other Ambulatory Visit (INDEPENDENT_AMBULATORY_CARE_PROVIDER_SITE_OTHER): Payer: Self-pay

## 2022-11-19 DIAGNOSIS — Z79899 Other long term (current) drug therapy: Secondary | ICD-10-CM

## 2022-11-19 DIAGNOSIS — G35 Multiple sclerosis: Secondary | ICD-10-CM | POA: Diagnosis not present

## 2022-11-19 DIAGNOSIS — Z0289 Encounter for other administrative examinations: Secondary | ICD-10-CM

## 2022-11-19 NOTE — Telephone Encounter (Signed)
Placed [facility] lab in [facility] lock box for routine lab pick up. Results pending. 

## 2022-11-20 LAB — CBC WITH DIFFERENTIAL/PLATELET
Basophils Absolute: 0.1 10*3/uL (ref 0.0–0.2)
Basos: 1 %
EOS (ABSOLUTE): 0.6 10*3/uL — ABNORMAL HIGH (ref 0.0–0.4)
Eos: 5 %
Hematocrit: 36.7 % (ref 34.0–46.6)
Hemoglobin: 12.2 g/dL (ref 11.1–15.9)
Immature Grans (Abs): 0 10*3/uL (ref 0.0–0.1)
Immature Granulocytes: 0 %
Lymphocytes Absolute: 5.6 10*3/uL — ABNORMAL HIGH (ref 0.7–3.1)
Lymphs: 45 %
MCH: 30.2 pg (ref 26.6–33.0)
MCHC: 33.2 g/dL (ref 31.5–35.7)
MCV: 91 fL (ref 79–97)
Monocytes Absolute: 1.1 10*3/uL — ABNORMAL HIGH (ref 0.1–0.9)
Monocytes: 9 %
Neutrophils Absolute: 4.9 10*3/uL (ref 1.4–7.0)
Neutrophils: 40 %
Platelets: 285 10*3/uL (ref 150–450)
RBC: 4.04 x10E6/uL (ref 3.77–5.28)
RDW: 12.5 % (ref 11.7–15.4)
WBC: 12.3 10*3/uL — ABNORMAL HIGH (ref 3.4–10.8)

## 2022-11-24 DIAGNOSIS — B9689 Other specified bacterial agents as the cause of diseases classified elsewhere: Secondary | ICD-10-CM | POA: Diagnosis not present

## 2022-11-24 DIAGNOSIS — J019 Acute sinusitis, unspecified: Secondary | ICD-10-CM | POA: Diagnosis not present

## 2022-11-24 DIAGNOSIS — Z789 Other specified health status: Secondary | ICD-10-CM | POA: Diagnosis not present

## 2022-11-29 NOTE — Telephone Encounter (Signed)
JCV ab drawn on 11/19/22 negative, index: 0.16.

## 2022-12-07 ENCOUNTER — Encounter: Payer: Self-pay | Admitting: Neurology

## 2022-12-11 ENCOUNTER — Ambulatory Visit: Payer: BC Managed Care – PPO

## 2022-12-11 DIAGNOSIS — D352 Benign neoplasm of pituitary gland: Secondary | ICD-10-CM

## 2022-12-11 DIAGNOSIS — G35 Multiple sclerosis: Secondary | ICD-10-CM

## 2022-12-11 MED ORDER — GADOBENATE DIMEGLUMINE 529 MG/ML IV SOLN
15.0000 mL | Freq: Once | INTRAVENOUS | Status: AC | PRN
  Administered 2022-12-11: 15 mL via INTRAVENOUS

## 2022-12-17 DIAGNOSIS — G35 Multiple sclerosis: Secondary | ICD-10-CM | POA: Diagnosis not present

## 2022-12-18 ENCOUNTER — Other Ambulatory Visit: Payer: Self-pay | Admitting: Neurology

## 2022-12-18 NOTE — Telephone Encounter (Signed)
Pt called stating that she sent request this morning and is wanting to know if this will be approved today. Please advise.

## 2022-12-19 MED ORDER — AMPHETAMINE-DEXTROAMPHETAMINE 15 MG PO TABS: ORAL_TABLET | ORAL | 0 refills | Status: DC

## 2022-12-21 DIAGNOSIS — H47323 Drusen of optic disc, bilateral: Secondary | ICD-10-CM | POA: Diagnosis not present

## 2022-12-21 DIAGNOSIS — H18831 Recurrent erosion of cornea, right eye: Secondary | ICD-10-CM | POA: Diagnosis not present

## 2022-12-21 DIAGNOSIS — H04123 Dry eye syndrome of bilateral lacrimal glands: Secondary | ICD-10-CM | POA: Diagnosis not present

## 2022-12-21 DIAGNOSIS — G35 Multiple sclerosis: Secondary | ICD-10-CM | POA: Diagnosis not present

## 2022-12-21 DIAGNOSIS — H526 Other disorders of refraction: Secondary | ICD-10-CM | POA: Diagnosis not present

## 2022-12-28 DIAGNOSIS — D225 Melanocytic nevi of trunk: Secondary | ICD-10-CM | POA: Diagnosis not present

## 2022-12-28 DIAGNOSIS — L821 Other seborrheic keratosis: Secondary | ICD-10-CM | POA: Diagnosis not present

## 2022-12-28 DIAGNOSIS — L720 Epidermal cyst: Secondary | ICD-10-CM | POA: Diagnosis not present

## 2022-12-28 DIAGNOSIS — L858 Other specified epidermal thickening: Secondary | ICD-10-CM | POA: Diagnosis not present

## 2023-01-14 DIAGNOSIS — G35 Multiple sclerosis: Secondary | ICD-10-CM | POA: Diagnosis not present

## 2023-01-28 ENCOUNTER — Other Ambulatory Visit: Payer: Self-pay | Admitting: Neurology

## 2023-01-28 MED ORDER — AMPHETAMINE-DEXTROAMPHETAMINE 15 MG PO TABS: ORAL_TABLET | ORAL | 0 refills | Status: DC

## 2023-02-11 DIAGNOSIS — G35 Multiple sclerosis: Secondary | ICD-10-CM | POA: Diagnosis not present

## 2023-02-12 ENCOUNTER — Ambulatory Visit: Payer: BC Managed Care – PPO | Admitting: Neurology

## 2023-03-08 DIAGNOSIS — F411 Generalized anxiety disorder: Secondary | ICD-10-CM | POA: Diagnosis not present

## 2023-03-11 DIAGNOSIS — G35 Multiple sclerosis: Secondary | ICD-10-CM | POA: Diagnosis not present

## 2023-03-13 ENCOUNTER — Other Ambulatory Visit: Payer: Self-pay

## 2023-03-13 ENCOUNTER — Other Ambulatory Visit: Payer: Self-pay | Admitting: Neurology

## 2023-03-13 MED ORDER — AMPHETAMINE-DEXTROAMPHETAMINE 15 MG PO TABS: ORAL_TABLET | ORAL | 0 refills | Status: DC

## 2023-03-13 NOTE — Telephone Encounter (Addendum)
Last Seen 09/06/2022 No Upcoming Appointment Adderall Last Filled 01/28/2023  Note To Pharmacy "Pt is to Make and Keep Appointment for Future Refill Request"

## 2023-03-13 NOTE — Telephone Encounter (Signed)
Pt requesting refill on amphetamine-dextroamphetamine (ADDERALL) 15 MG tabl. Should be sent to [facility] #[id number]

## 2023-03-15 ENCOUNTER — Other Ambulatory Visit: Payer: Self-pay | Admitting: Neurology

## 2023-03-15 MED ORDER — AMPHETAMINE-DEXTROAMPHETAMINE 15 MG PO TABS: ORAL_TABLET | ORAL | 0 refills | Status: DC

## 2023-03-15 NOTE — Telephone Encounter (Signed)
Pt has been without this medication since Wed. She asked that it be called into [facility] #[id number] but it looks like it was called into :[facility] - [place], [place] - [address], [place] [address] Phone: [phone]  Fax: [phone] .  Pt has asked that the On Call Dr be called to see they can send this to the correct pharmacy for her.

## 2023-03-15 NOTE — Telephone Encounter (Signed)
I called pt. Advised Dr. Lucia Gaskins called in Adderall to [facility] instead for her. She verbalized understanding and appreciation.

## 2023-04-08 DIAGNOSIS — G35 Multiple sclerosis: Secondary | ICD-10-CM | POA: Diagnosis not present

## 2023-04-16 ENCOUNTER — Other Ambulatory Visit: Payer: Self-pay | Admitting: Neurology

## 2023-04-17 MED ORDER — AMPHETAMINE-DEXTROAMPHETAMINE 15 MG PO TABS: ORAL_TABLET | ORAL | 0 refills | Status: DC

## 2023-05-06 DIAGNOSIS — G35 Multiple sclerosis: Secondary | ICD-10-CM | POA: Diagnosis not present

## 2023-05-17 DIAGNOSIS — H18831 Recurrent erosion of cornea, right eye: Secondary | ICD-10-CM | POA: Diagnosis not present

## 2023-05-17 DIAGNOSIS — H04123 Dry eye syndrome of bilateral lacrimal glands: Secondary | ICD-10-CM | POA: Diagnosis not present

## 2023-06-03 ENCOUNTER — Other Ambulatory Visit: Payer: Self-pay | Admitting: *Deleted

## 2023-06-03 ENCOUNTER — Encounter: Payer: Self-pay | Admitting: Neurology

## 2023-06-03 ENCOUNTER — Other Ambulatory Visit: Payer: Self-pay

## 2023-06-03 ENCOUNTER — Telehealth: Payer: Self-pay | Admitting: *Deleted

## 2023-06-03 DIAGNOSIS — G35 Multiple sclerosis: Secondary | ICD-10-CM

## 2023-06-03 DIAGNOSIS — G35D Multiple sclerosis, unspecified: Secondary | ICD-10-CM

## 2023-06-03 DIAGNOSIS — Z79899 Other long term (current) drug therapy: Secondary | ICD-10-CM

## 2023-06-03 NOTE — Telephone Encounter (Signed)
Placed [facility] lab in [facility] lock box for routine lab pick up. Results pending. 

## 2023-06-10 NOTE — Telephone Encounter (Signed)
JCV returned negative  index antibody: 0.18

## 2023-06-24 DIAGNOSIS — Z01419 Encounter for gynecological examination (general) (routine) without abnormal findings: Secondary | ICD-10-CM | POA: Diagnosis not present

## 2023-06-24 DIAGNOSIS — Z6822 Body mass index (BMI) 22.0-22.9, adult: Secondary | ICD-10-CM | POA: Diagnosis not present

## 2023-06-27 NOTE — Progress Notes (Deleted)
No chief complaint on file.   HISTORY OF PRESENT ILLNESS:  06/27/23 ALL:  Chloe Pittman is a 37 y.o. female here today for follow up for RRMS. She continues Tysabri. Last JCV 0.18 05/2023. MRI brain stable 12/2022.   Gabapentin? RLS and sleep  Headaches?   Mood is... Abilify 2mg  daily was added to and sertraline 25mg ? 100mg ? Daily at last visit with Dr Epimenio Foot. Psychaitry/psychology?  Adderall 15mg  daily with extra dose in afternoon as needed.   She continues cabergoline 0.25mg  BID. MRI stable 12/2022. Prolactin level 6.9 09/2022.   HISTORY (copied from Dr Bonnita Hollow previous note)  Chloe Pittman is a 37 y.o. woman with relapsing remitting multiple sclerosis.    Update 09/06/2022: She is on Tysabri and tolerates it well.  Last JCV 06/27/2022 and was negative 0.14.        Gait and balance are stable - sometimes she is a little off balanced.  She uses the bannister on stairs.   She had one fall on stairs (slipped).  has ut has some fluctuating paresthesias in her right leg.  This is not painful and is not intense.   That leg had numbness and weakness with her initial MS exacerbation leading to diagnosis.  She denies weakness.  Balance is slightly off but she has no falls.  Balance is sometimes a little bit worse in the heat.  The right leg sometimes goes mildly spastic and has a right foot drop, especially if hot.  Bladder function is fine.  Vision is stable - occasional has a scintillating in her vision but she never loses vision.   Sometimes if bends over ad gets back up she has a mild blackening of vision for seconds..   She is on cabergline for the pituitary tumor.  She is down to 1/2 pill once a week.  She is doing better with no lactation or menstrual issues.   She is on sertraline 100 mg with benefit for depression.   She continues Adderall for attention - would like to try a higher dose.   Adderall has helped her focus/attention some.    She notes she is more fatigued with worse  cognition when she does not take it.   She works as a Armed forces operational officer.     MS HISTORY She developed numbness in her right side 12/2018.    She did not note much clumsiness initially.  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   Symptoms were worse the next day when she had intense numbness and also right arm clumsiness.  She presented to the ED.   She noted more numbness while in the hospital, especially from the breast down.    MRI's 12/18/2018 were c/w MS with 1-2 right temporal enhancing brain lesions and several non-enhancing cervical spine lesions but no definite thoracic spine lesions.  She received 5 days of IV Solumedrol and improved.  I saw her shortly after the hospital stay and we started Tysabri in March 2020   She has no FH of MS.  Her sister has psoriasis.   IMAGING MRI of the brain 01/09/2022 showed Multiple T2/FLAIR hyperintense foci in the hemispheres, left thalamus, left cerebellum and spinal cord in a pattern consistent with chronic demyelinating plaque associated with multiple sclerosis.  None of the foci enhance or appear to be acute.  Compared to the MRI dated 07/19/2021, there are no new lesions.    3 mm hypoenhancing focus  in the right pituitary hemisphere consistent with a pituitary microadenoma. No acute findings.   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   MRI of the brain 07/19/2021 showed T2/FLAIR hyperintense foci in the cerebral hemispheres and left cerebellar hemisphere.  The pattern and configuration are consistent with chronic demyelinating plaques associated with multiple sclerosis.  None of the foci appear to be acute.  They do not enhance.  Compared to the MRI dated 06/21/2020, there are no new lesions.   Pituitary appeared normal.     MRI brain 06/21/2020 shows T2/FLAIR hyperintense foci in the hemispheres and cerebellum in a pattern and configuration consistent with  chronic demyelinating plaque associated with multiple sclerosis.  None of the foci appear to be acute and they do not enhance.  Compared to the MRI from 06/16/2019, there are no new lesions.   MRI of the brain 12/18/2018 shows multiple T2/flair hyperintense foci, many in the periventricular and juxtacortical white matter.  There are also a couple foci in the left cerebellar hemisphere and left middle cerebellar peduncle.  About 3 foci enhanced after contrast.     MRI of the cervical spine shows multiple lesions, to the right adjacent to C2-C3, dorsally towards the right adjacent to C4, centrally, little more to the left adjacent to C6.     LabsVitamin D was 28.9, HIV was negative.   REVIEW OF SYSTEMS: Out of a complete 14 system review of symptoms, the patient complains only of the following symptoms, and all other reviewed systems are negative.   ALLERGIES: Allergies  Allergen Reactions   Sulfa Antibiotics Nausea Only     HOME MEDICATIONS: Outpatient Medications Prior to Visit  Medication Sig Dispense Refill   amphetamine-dextroamphetamine (ADDERALL) 15 MG tablet 1 tablet in the morning. May take additional tablet at noon if necessary 60 tablet 0   ARIPiprazole (ABILIFY) 2 MG tablet Take 1 tablet (2 mg total) by mouth daily. 30 tablet 11   cabergoline (DOSTINEX) 0.5 MG tablet Take 0.5 tablets (0.25 mg total) by mouth 2 (two) times a week. 24 tablet 3   ibuprofen (ADVIL) 200 MG tablet Take 200 mg by mouth every 6 (six) hours as needed.     natalizumab (TYSABRI) 300 MG/15ML injection Inject 300 mg into the vein every 28 (twenty-eight) days.     sertraline (ZOLOFT) 25 MG tablet Take 25 mg by mouth daily.     No facility-administered medications prior to visit.     PAST MEDICAL HISTORY: Past Medical History:  Diagnosis Date   Hx of varicella    Medical history non-contributory    MS (multiple sclerosis) (HCC)      PAST SURGICAL HISTORY: Past Surgical History:  Procedure  Laterality Date   FACIAL COSMETIC SURGERY     dog bite   HEMORRHOID SURGERY N/A 05/14/2018   Procedure: HEMORRHOIDECTOMY;  Surgeon: Karie Soda, MD;  Location: [facility];  Service: General;  Laterality: N/A;   INNER EAR SURGERY     PEXY N/A 05/14/2018   Procedure: HEMORRHOIDAL LIGATION/PEXY;  Surgeon: Karie Soda, MD;  Location: [facility];  Service: General;  Laterality: N/A;   RECTAL EXAM UNDER ANESTHESIA N/A 05/14/2018   Procedure: ANORECTAL EXAM UNDER ANESTHESIA;  Surgeon: Karie Soda, MD;  Location: [facility];  Service: General;  Laterality: N/A;   TUBES IN EARS       FAMILY HISTORY: Family History  Problem Relation Age of Onset   Cancer Father  TONSIL    Hypertension Father    Diabetes Father    Cancer Paternal Aunt        SKIN   Heart disease Maternal Grandmother    Cancer Paternal Grandmother        LUNG AND LIVER   Stroke Paternal Grandmother    Diabetes Mother    Breast cancer Maternal Aunt 33     SOCIAL HISTORY: Social History   Socioeconomic History   Marital status: Married    Spouse name: Not on file   Number of children: 1   Years of education: Associates degree   Highest education level: Not on file  Occupational History   Occupation: [employer]. [employer]  Tobacco Use   Smoking status: Former   Smokeless tobacco: Never   Tobacco comments:    Last smoked 5-19, just one cigarette  Vaping Use   Vaping status: Former  Substance and Sexual Activity   Alcohol use: Yes    Comment: SOCIAL - WEEK ENDS    Drug use: No   Sexual activity: Yes    Birth control/protection: None  Other Topics Concern   Not on file  Social History Narrative   Lives with husband    Caffeine use: 2 per day   Right handed    Social Determinants of Health   Financial Resource Strain: Unknown (12/26/2021)   Received from [facility], [facility]   Overall Financial Resource Strain (CARDIA)    Difficulty of Paying Living Expenses: Patient declined  Food Insecurity:  Unknown (12/26/2021)   Received from [facility], [facility]   Hunger Vital Sign    Worried About Running Out of Food in the Last Year: Patient declined    Ran Out of Food in the Last Year: Patient declined  Transportation Needs: Unknown (12/26/2021)   Received from [facility], [facility]   [facility] - Transportation    Lack of Transportation (Medical): Patient declined    Lack of Transportation (Non-Medical): Patient declined  Physical Activity: Insufficiently Active (12/26/2021)   Received from [facility], [facility]   Exercise Vital Sign    Days of Exercise per Week: 1 day    Minutes of Exercise per Session: 40 min  Stress: Stress Concern Present (12/26/2021)   Received from [facility], [facility]   [employer] [facility] - Occupational Stress Questionnaire    Feeling of Stress : To some extent  Social Connections: Unknown (03/20/2022)   Received from [facility], [facility]   Social Network    Social Network: Not on file  Intimate Partner Violence: Unknown (02/09/2022)   Received from [facility], [facility]   HITS    Physically Hurt: Not on file    Insult or Talk Down To: Not on file    Threaten Physical Harm: Not on file    Scream or Curse: Not on file     PHYSICAL EXAM  There were no vitals filed for this visit. There is no height or weight on file to calculate BMI.  Generalized: Well developed, in no acute distress  Cardiology: normal rate and rhythm, no murmur auscultated  Respiratory: clear to auscultation bilaterally    Neurological examination  Mentation: Alert oriented to time, place, history taking. Follows all commands speech and language fluent Cranial nerve II-XII: Pupils were equal round reactive to light. Extraocular movements were full, visual field were full on confrontational test. Facial sensation and strength were normal. Uvula tongue midline. Head turning and shoulder shrug  were normal and  symmetric. Motor: The motor testing reveals 5 over 5 strength of all 4 extremities. Good symmetric motor tone is noted throughout.  Sensory: Sensory testing is intact to soft touch on all 4 extremities. No evidence of extinction is noted.  Coordination: Cerebellar testing reveals good finger-nose-finger and heel-to-shin bilaterally.  Gait and station: Gait is normal. Tandem gait is normal. Romberg is negative. No drift is seen.  Reflexes: Deep tendon reflexes are symmetric and normal bilaterally.    DIAGNOSTIC DATA (LABS, IMAGING, TESTING) - I reviewed patient records, labs, notes, testing and imaging myself where available.  Lab Results  Component Value Date   WBC 8.1 06/03/2023   HGB 12.7 06/03/2023   HCT 37.8 06/03/2023   MCV 91 06/03/2023   PLT 249 06/03/2023      Component Value Date/Time   NA 142 05/10/2020 1724   K 4.3 05/10/2020 1724   CL 104 05/10/2020 1724   CO2 30 05/10/2020 1724   GLUCOSE 84 05/10/2020 1724   BUN 20 05/10/2020 1724   CREATININE 0.87 05/10/2020 1724   CREATININE 0.84 10/06/2014 1245   CALCIUM 9.3 05/10/2020 1724   PROT 7.4 05/10/2020 1724   ALBUMIN 4.3 05/10/2020 1724   AST 21 05/10/2020 1724   ALT 20 05/10/2020 1724   ALKPHOS 50 05/10/2020 1724   BILITOT 0.5 05/10/2020 1724   GFRNONAA >60 05/10/2020 1724   GFRAA >60 05/10/2020 1724   Lab Results  Component Value Date   CHOL 140 10/06/2014   HDL 56 11/14/2012   LDLCALC 75 11/14/2012   TRIG 31 11/14/2012   CHOLHDL 2.4 11/14/2012   Lab Results  Component Value Date   HGBA1C 5.4 12/19/2018   No results found for: "VITAMINB12" Lab Results  Component Value Date   TSH 2.377 12/18/2018        No data to display               No data to display           ASSESSMENT AND PLAN  37 y.o. year old female  has a past medical history of varicella, Medical history non-contributory, and MS (multiple sclerosis) (HCC). here with    No diagnosis found.  Jearld Pies ***.   Healthy lifestyle habits encouraged. *** will follow up with PCP as directed. *** will return to see me in ***, sooner if needed. *** verbalizes understanding and agreement with this plan.   No orders of the defined types were placed in this encounter.    No orders of the defined types were placed in this encounter.    Shawnie Dapper, MSN, FNP-C 06/27/2023, 8:26 AM  [facility] [address] [place], [place] [address] [phone]

## 2023-07-01 ENCOUNTER — Ambulatory Visit: Payer: BC Managed Care – PPO | Admitting: Family Medicine

## 2023-07-01 DIAGNOSIS — F32A Depression, unspecified: Secondary | ICD-10-CM

## 2023-07-01 DIAGNOSIS — D352 Benign neoplasm of pituitary gland: Secondary | ICD-10-CM

## 2023-07-01 DIAGNOSIS — F988 Other specified behavioral and emotional disorders with onset usually occurring in childhood and adolescence: Secondary | ICD-10-CM

## 2023-07-01 DIAGNOSIS — Z79899 Other long term (current) drug therapy: Secondary | ICD-10-CM

## 2023-07-01 DIAGNOSIS — G35 Multiple sclerosis: Secondary | ICD-10-CM

## 2023-07-01 DIAGNOSIS — E559 Vitamin D deficiency, unspecified: Secondary | ICD-10-CM

## 2023-07-01 DIAGNOSIS — Z1231 Encounter for screening mammogram for malignant neoplasm of breast: Secondary | ICD-10-CM | POA: Diagnosis not present

## 2023-07-01 DIAGNOSIS — F09 Unspecified mental disorder due to known physiological condition: Secondary | ICD-10-CM

## 2023-07-29 DIAGNOSIS — N941 Unspecified dyspareunia: Secondary | ICD-10-CM | POA: Diagnosis not present

## 2023-07-29 DIAGNOSIS — G35 Multiple sclerosis: Secondary | ICD-10-CM | POA: Diagnosis not present

## 2023-08-26 DIAGNOSIS — L602 Onychogryphosis: Secondary | ICD-10-CM | POA: Diagnosis not present

## 2023-08-26 DIAGNOSIS — M67472 Ganglion, left ankle and foot: Secondary | ICD-10-CM | POA: Diagnosis not present

## 2023-08-26 DIAGNOSIS — G35 Multiple sclerosis: Secondary | ICD-10-CM | POA: Diagnosis not present

## 2023-08-26 DIAGNOSIS — D352 Benign neoplasm of pituitary gland: Secondary | ICD-10-CM | POA: Diagnosis not present

## 2023-08-26 DIAGNOSIS — L600 Ingrowing nail: Secondary | ICD-10-CM | POA: Diagnosis not present

## 2023-08-26 DIAGNOSIS — Z1330 Encounter for screening examination for mental health and behavioral disorders, unspecified: Secondary | ICD-10-CM | POA: Diagnosis not present

## 2023-08-26 DIAGNOSIS — L603 Nail dystrophy: Secondary | ICD-10-CM | POA: Diagnosis not present

## 2023-09-03 ENCOUNTER — Telehealth: Payer: Self-pay | Admitting: Neurology

## 2023-09-03 NOTE — Telephone Encounter (Signed)
..   Pt understands that although there may be some limitations with this type of visit, we will take all precautions to reduce any security or privacy concerns.  Pt understands that this will be treated like an in office visit and we will file with pt's insurance, and there may be a patient responsible charge related to this service. ? ?

## 2023-09-05 NOTE — Patient Instructions (Addendum)
Below is our plan:  We will continue Tysabri every month. We will restart Adderall 15mg  daily. May take extra 15mg  dose at lunch as needed. Please do not abruptly discontinue medications. I am happy to help you wean dose in the future if you wish. I will have the staff check Dr Bonnita Hollow schedule for a appt that works better for you. Let me know if you need me.   Please make sure you are staying well hydrated. I recommend 50-60 ounces daily. Well balanced diet and regular exercise encouraged. Consistent sleep schedule with 6-8 hours recommended.   Please continue follow up with care team as directed.   Follow up with Dr Epimenio Foot in 6 months  You may receive a survey regarding today's visit. I encourage you to leave honest feed back as I do use this information to improve patient care. Thank you for seeing me today!

## 2023-09-05 NOTE — Progress Notes (Signed)
PATIENT: Chloe Pittman DOB: 1986/03/19  REASON FOR VISIT: follow up HISTORY FROM: patient  Virtual Visit via Telephone Note  I connected with Chloe Pittman on 09/09/23 at  8:00 AM EST by telephone and verified that I am speaking with the correct person using two identifiers.   I discussed the limitations, risks, security and privacy concerns of performing an evaluation and management service by telephone and the availability of in person appointments. I also discussed with the patient that there may be a patient responsible charge related to this service. The patient expressed understanding and agreed to proceed.   History of Present Illness:  09/09/23 ALL (Mychart): Chloe Pittman is a 37 y.o. female here today for follow up for RRMS. She was last seen by Dr Epimenio Foot 09/2022. Last JCV 0.18, negative, 06/2023.   She reports MS symptoms are stable. She denies new or exacerbating symptoms. She does has right foot drop and feels this causes her to get tripped up. No falls or assistive assistive devices. She feels RLS is stable. Gabapentin was stopped over a year ago. She does have color changes of cuticles. Sister has Raynaud's. Headaches have been stable. She took eletriptan in 2021 but has not needed it recently.   Abilify 2mg  daily stopped about a year ago. She is also taking sertraline through PCP but discontinued abruptly about 2-3 months ago. She feels that mood is stable. She is sleeping well. She stopped Adderall about 2-3 months ago. She does feel that she is having significant difficulty concentrating and staying on task. She was doing well on Adderall previously and would like to restart.   She stopped cabergoline a few months ago. She was seen in follow up with her endocrinologist, Dr Shawnee Knapp with [facility], last week. Labs were updated. Prolactin 12.2 on 08/26/2023. He advised to hold cabergoline for now and will follow up in 6 months.    History (copied from Dr Bonnita Hollow previous  note)  Chloe Pittman is a 37 y.o. woman with relapsing remitting multiple sclerosis.    Update 09/06/2022: She is on Tysabri and tolerates it well.  Last JCV 06/27/2022 and was negative 0.14.        Gait and balance are stable - sometimes she is a little off balanced.  She uses the bannister on stairs.   She had one fall on stairs (slipped).  has ut has some fluctuating paresthesias in her right leg.  This is not painful and is not intense.   That leg had numbness and weakness with her initial MS exacerbation leading to diagnosis.  She denies weakness.  Balance is slightly off but she has no falls.  Balance is sometimes a little bit worse in the heat.  The right leg sometimes goes mildly spastic and has a right foot drop, especially if hot.  Bladder function is fine.  Vision is stable - occasional has a scintillating in her vision but she never loses vision.   Sometimes if bends over ad gets back up she has a mild blackening of vision for seconds..   She is on cabergline for the pituitary tumor.  She is down to 1/2 pill once a week.  She is doing better with no lactation or menstrual issues.   She is on sertraline 100 mg with benefit for depression.   She continues Adderall for attention - would like to try a higher dose.   Adderall has helped her focus/attention some.    She notes she is  more fatigued with worse cognition when she does not take it.   She works as a Armed forces operational officer.     MS HISTORY She developed numbness in her right side 12/2018.    She did not note much clumsiness initially.  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   Symptoms were worse the next day when she had intense numbness and also right arm clumsiness.  She presented to the ED.   She noted more numbness while in the hospital, especially from the breast down.    MRI's 12/18/2018 were c/w MS with 1-2 right temporal enhancing brain lesions and several non-enhancing cervical spine lesions but no definite  thoracic spine lesions.  She received 5 days of IV Solumedrol and improved.  I saw her shortly after the hospital stay and we started Tysabri in March 2020   She has no FH of MS.  Her sister has psoriasis.   IMAGING MRI of the brain 01/09/2022 showed Multiple T2/FLAIR hyperintense foci in the hemispheres, left thalamus, left cerebellum and spinal cord in a pattern consistent with chronic demyelinating plaque associated with multiple sclerosis.  None of the foci enhance or appear to be acute.  Compared to the MRI dated 07/19/2021, there are no new lesions.    3 mm hypoenhancing focus in the right pituitary hemisphere consistent with a pituitary microadenoma. No acute findings.   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   MRI of the brain 07/19/2021 showed T2/FLAIR hyperintense foci in the cerebral hemispheres and left cerebellar hemisphere.  The pattern and configuration are consistent with chronic demyelinating plaques associated with multiple sclerosis.  None of the foci appear to be acute.  They do not enhance.  Compared to the MRI dated 06/21/2020, there are no new lesions.   Pituitary appeared normal.     MRI brain 06/21/2020 shows T2/FLAIR hyperintense foci in the hemispheres and cerebellum in a pattern and configuration consistent with chronic demyelinating plaque associated with multiple sclerosis.  None of the foci appear to be acute and they do not enhance.  Compared to the MRI from 06/16/2019, there are no new lesions.   MRI of the brain 12/18/2018 shows multiple T2/flair hyperintense foci, many in the periventricular and juxtacortical white matter.  There are also a couple foci in the left cerebellar hemisphere and left middle cerebellar peduncle.  About 3 foci enhanced after contrast.     MRI of the cervical spine shows multiple lesions, to the right adjacent to C2-C3, dorsally towards the right adjacent to C4,  centrally, little more to the left adjacent to C6.     LabsVitamin D was 28.9, HIV was negative.   Observations/Objective:  Generalized: Well developed, in no acute distress  Mentation: Alert oriented to time, place, history taking. Follows all commands speech and language fluent   Assessment and Plan:  37 y.o. year old female  has a past medical history of varicella, Medical history non-contributory, and MS (multiple sclerosis) (HCC). here with    ICD-10-CM   1. Multiple sclerosis (HCC)  G35     2. High risk medication use  Z79.899     3. 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 Depression, unspecified depression type  F32.A       She will continue Tysabri infusions every  month. Will update labs in 12/2022. She will restart Adderall 15mg  daily. May take second dose at lunch as needed. She is aware to avoid abrupt discontinuation of medications. She has follow up with endocrinology in 6 months. MRI stable 12/2022. We will have her follow up with Dr Epimenio Foot in 6 months. She verbalizes understanding and agreement with this plan.    No orders of the defined types were placed in this encounter.   Meds ordered this encounter  Medications   amphetamine-dextroamphetamine (ADDERALL) 15 MG tablet    Sig: 1 tablet in the morning. May take additional tablet at noon if necessary    Dispense:  60 tablet    Refill:  0    Order Specific Question:   Supervising Provider    Answer:   Anson Fret [[id number]]     Follow Up Instructions:  I discussed the assessment and treatment plan with the patient. The patient was provided an opportunity to ask questions and all were answered. The patient agreed with the plan and demonstrated an understanding of the instructions.   The patient was advised to call back or seek an in-person evaluation if the symptoms worsen or if the condition fails to improve as anticipated.  I  provided 30 minutes of non-face-to-face time during this encounter. Patient located at their place of residence during Mychart visit. Provider is in the office.    Shawnie Dapper, NP

## 2023-09-06 DIAGNOSIS — D352 Benign neoplasm of pituitary gland: Secondary | ICD-10-CM | POA: Diagnosis not present

## 2023-09-09 ENCOUNTER — Telehealth (INDEPENDENT_AMBULATORY_CARE_PROVIDER_SITE_OTHER): Payer: BC Managed Care – PPO | Admitting: Family Medicine

## 2023-09-09 ENCOUNTER — Encounter: Payer: Self-pay | Admitting: Family Medicine

## 2023-09-09 DIAGNOSIS — D352 Benign neoplasm of pituitary gland: Secondary | ICD-10-CM | POA: Diagnosis not present

## 2023-09-09 DIAGNOSIS — F988 Other specified behavioral and emotional disorders with onset usually occurring in childhood and adolescence: Secondary | ICD-10-CM | POA: Diagnosis not present

## 2023-09-09 DIAGNOSIS — G35 Multiple sclerosis: Secondary | ICD-10-CM

## 2023-09-09 DIAGNOSIS — F32A Depression, unspecified: Secondary | ICD-10-CM | POA: Diagnosis not present

## 2023-09-09 DIAGNOSIS — Z79899 Other long term (current) drug therapy: Secondary | ICD-10-CM

## 2023-09-09 MED ORDER — AMPHETAMINE-DEXTROAMPHETAMINE 15 MG PO TABS: ORAL_TABLET | ORAL | 0 refills | Status: DC

## 2023-09-23 DIAGNOSIS — G35 Multiple sclerosis: Secondary | ICD-10-CM | POA: Diagnosis not present

## 2023-10-21 ENCOUNTER — Telehealth: Payer: Self-pay | Admitting: Family Medicine

## 2023-10-21 ENCOUNTER — Other Ambulatory Visit: Payer: Self-pay | Admitting: Family Medicine

## 2023-10-21 DIAGNOSIS — G35 Multiple sclerosis: Secondary | ICD-10-CM | POA: Diagnosis not present

## 2023-10-21 MED ORDER — AMPHETAMINE-DEXTROAMPHETAMINE 15 MG PO TABS: ORAL_TABLET | ORAL | 0 refills | Status: DC

## 2023-10-21 NOTE — Telephone Encounter (Signed)
I went in to refill request.

## 2023-10-21 NOTE — Telephone Encounter (Signed)
Pt needs a refill of her Adderall sent to [facility] in [place]. States she ran out yesterday.

## 2023-10-21 NOTE — Telephone Encounter (Signed)
Last seen 09-09-2023, next appt 03-23-2024.  Last fill 09-09-2023 #60

## 2023-11-21 ENCOUNTER — Other Ambulatory Visit: Payer: Self-pay

## 2023-11-21 ENCOUNTER — Telehealth: Payer: Self-pay

## 2023-11-21 DIAGNOSIS — G35 Multiple sclerosis: Secondary | ICD-10-CM | POA: Diagnosis not present

## 2023-11-21 NOTE — Telephone Encounter (Signed)
Placed JCV in [facility] Box 11/21/2023

## 2023-11-22 ENCOUNTER — Other Ambulatory Visit: Payer: Self-pay | Admitting: Neurology

## 2023-11-22 LAB — CBC WITH DIFFERENTIAL/PLATELET
Basophils Absolute: 0.1 10*3/uL (ref 0.0–0.2)
Basos: 1 %
EOS (ABSOLUTE): 0.5 10*3/uL — ABNORMAL HIGH (ref 0.0–0.4)
Eos: 7 %
Hematocrit: 36.1 % (ref 34.0–46.6)
Hemoglobin: 11.6 g/dL (ref 11.1–15.9)
Immature Grans (Abs): 0 10*3/uL (ref 0.0–0.1)
Immature Granulocytes: 0 %
Lymphocytes Absolute: 3.3 10*3/uL — ABNORMAL HIGH (ref 0.7–3.1)
Lymphs: 46 %
MCH: 29.7 pg (ref 26.6–33.0)
MCHC: 32.1 g/dL (ref 31.5–35.7)
MCV: 93 fL (ref 79–97)
Monocytes Absolute: 0.6 10*3/uL (ref 0.1–0.9)
Monocytes: 8 %
Neutrophils Absolute: 2.7 10*3/uL (ref 1.4–7.0)
Neutrophils: 38 %
Platelets: 250 10*3/uL (ref 150–450)
RBC: 3.9 x10E6/uL (ref 3.77–5.28)
RDW: 12.9 % (ref 11.7–15.4)
WBC: 7.2 10*3/uL (ref 3.4–10.8)

## 2023-11-27 NOTE — Telephone Encounter (Signed)
Pt checking status of this refill. States she has 2 left

## 2023-11-28 ENCOUNTER — Other Ambulatory Visit: Payer: Self-pay

## 2023-11-28 MED ORDER — AMPHETAMINE-DEXTROAMPHETAMINE 15 MG PO TABS: ORAL_TABLET | ORAL | 0 refills | Status: DC

## 2023-11-28 NOTE — Telephone Encounter (Signed)
Pt Last Seen 09/09/2023 Upcoming Appointment 03/23/2024  Adderall Last Filled 10/22/2023 Escript 11/28/2023

## 2023-12-11 ENCOUNTER — Other Ambulatory Visit: Payer: Self-pay | Admitting: Obstetrics and Gynecology

## 2023-12-11 DIAGNOSIS — Z803 Family history of malignant neoplasm of breast: Secondary | ICD-10-CM

## 2023-12-16 ENCOUNTER — Encounter: Payer: Self-pay | Admitting: Neurology

## 2023-12-19 DIAGNOSIS — G35 Multiple sclerosis: Secondary | ICD-10-CM | POA: Diagnosis not present

## 2023-12-23 DIAGNOSIS — L089 Local infection of the skin and subcutaneous tissue, unspecified: Secondary | ICD-10-CM | POA: Diagnosis not present

## 2023-12-23 DIAGNOSIS — K1370 Unspecified lesions of oral mucosa: Secondary | ICD-10-CM | POA: Diagnosis not present

## 2023-12-23 DIAGNOSIS — B9689 Other specified bacterial agents as the cause of diseases classified elsewhere: Secondary | ICD-10-CM | POA: Diagnosis not present

## 2023-12-30 ENCOUNTER — Other Ambulatory Visit: Payer: Self-pay | Admitting: Neurology

## 2023-12-31 NOTE — Telephone Encounter (Signed)
 Pt is checking on the status of this refill request because she is completely out of the  amphetamine-dextroamphetamine (ADDERALL) 15 MG tablet

## 2023-12-31 NOTE — Telephone Encounter (Signed)
 Last seen 09/09/23 and next f/u 03/23/24. Last refilled 11/28/23 #60.

## 2024-01-01 MED ORDER — AMPHETAMINE-DEXTROAMPHETAMINE 15 MG PO TABS: ORAL_TABLET | ORAL | 0 refills | Status: DC

## 2024-01-16 DIAGNOSIS — G35 Multiple sclerosis: Secondary | ICD-10-CM | POA: Diagnosis not present

## 2024-01-31 ENCOUNTER — Other Ambulatory Visit: Payer: Self-pay | Admitting: Neurology

## 2024-01-31 MED ORDER — AMPHETAMINE-DEXTROAMPHETAMINE 15 MG PO TABS: ORAL_TABLET | ORAL | 0 refills | Status: DC

## 2024-01-31 NOTE — Telephone Encounter (Signed)
 Pt is needing a refill on her amphetamine-dextroamphetamine (ADDERALL) 15 MG tablet sent to the [facility] in [place]. Marland Kitchen

## 2024-01-31 NOTE — Telephone Encounter (Signed)
 Dr.Dohmeier you are work in provider today (Dr.Sater patient) Last seen on 09/09/23 Follow up scheduled on 03/23/24 Last filled on 01/01/24 Rx pending to be signed

## 2024-02-13 DIAGNOSIS — G35 Multiple sclerosis: Secondary | ICD-10-CM | POA: Diagnosis not present

## 2024-03-04 ENCOUNTER — Other Ambulatory Visit: Payer: Self-pay | Admitting: Family Medicine

## 2024-03-04 MED ORDER — AMPHETAMINE-DEXTROAMPHETAMINE 15 MG PO TABS: ORAL_TABLET | ORAL | 0 refills | Status: DC

## 2024-03-04 NOTE — Telephone Encounter (Signed)
 Last seen on 09/09/23 Follow up scheduled on 03/23/24   Dispensed Days Supply Quantity Provider Pharmacy  D-AMPHETAMINE  SALT COMBO 15MG  TABS 01/31/2024 30 60 each Dohmeier, Raoul Byes, MD [facility] #...      Rx pending to be signed

## 2024-03-04 NOTE — Telephone Encounter (Signed)
Pt is requesting a refill for amphetamine-dextroamphetamine (ADDERALL) 10 MG tablet.  Pharmacy: [facility] [phone]

## 2024-03-06 DIAGNOSIS — D352 Benign neoplasm of pituitary gland: Secondary | ICD-10-CM | POA: Diagnosis not present

## 2024-03-13 DIAGNOSIS — L814 Other melanin hyperpigmentation: Secondary | ICD-10-CM | POA: Diagnosis not present

## 2024-03-13 DIAGNOSIS — L821 Other seborrheic keratosis: Secondary | ICD-10-CM | POA: Diagnosis not present

## 2024-03-13 DIAGNOSIS — G35 Multiple sclerosis: Secondary | ICD-10-CM | POA: Diagnosis not present

## 2024-03-13 DIAGNOSIS — D225 Melanocytic nevi of trunk: Secondary | ICD-10-CM | POA: Diagnosis not present

## 2024-03-13 DIAGNOSIS — L578 Other skin changes due to chronic exposure to nonionizing radiation: Secondary | ICD-10-CM | POA: Diagnosis not present

## 2024-03-23 ENCOUNTER — Ambulatory Visit (INDEPENDENT_AMBULATORY_CARE_PROVIDER_SITE_OTHER): Payer: BC Managed Care – PPO | Admitting: Neurology

## 2024-03-23 ENCOUNTER — Telehealth: Payer: Self-pay | Admitting: *Deleted

## 2024-03-23 ENCOUNTER — Encounter: Payer: Self-pay | Admitting: Neurology

## 2024-03-23 VITALS — BP 111/67 | HR 91 | Ht 70.0 in | Wt 144.5 lb

## 2024-03-23 DIAGNOSIS — E611 Iron deficiency: Secondary | ICD-10-CM

## 2024-03-23 DIAGNOSIS — I7300 Raynaud's syndrome without gangrene: Secondary | ICD-10-CM

## 2024-03-23 DIAGNOSIS — Z79899 Other long term (current) drug therapy: Secondary | ICD-10-CM | POA: Diagnosis not present

## 2024-03-23 DIAGNOSIS — G35 Multiple sclerosis: Secondary | ICD-10-CM

## 2024-03-23 DIAGNOSIS — R260 Ataxic gait: Secondary | ICD-10-CM

## 2024-03-23 DIAGNOSIS — G2581 Restless legs syndrome: Secondary | ICD-10-CM

## 2024-03-23 DIAGNOSIS — D352 Benign neoplasm of pituitary gland: Secondary | ICD-10-CM | POA: Diagnosis not present

## 2024-03-23 DIAGNOSIS — F988 Other specified behavioral and emotional disorders with onset usually occurring in childhood and adolescence: Secondary | ICD-10-CM

## 2024-03-23 MED ORDER — NITROGLYCERIN 0.4 % RE OINT: TOPICAL_OINTMENT | RECTAL | 3 refills | Status: AC

## 2024-03-23 MED ORDER — AMPHETAMINE-DEXTROAMPHETAMINE 20 MG PO TABS: 20.0000 mg | ORAL_TABLET | Freq: Every day | ORAL | 0 refills | Status: DC

## 2024-03-23 NOTE — Telephone Encounter (Signed)
 Placed JCV lab in [facility] lock box for routine lab pick up. Results pending.

## 2024-03-23 NOTE — Progress Notes (Signed)
 [facility]  PATIENT: Chloe Pittman DOB: 12-03-1985  REFERRING DOCTOR OR PCP: Allen Archer, MD SOURCE: Patient, emergency room/hospital notes, imaging and lab reports, multiple MRI images personally reviewed on PACS.  _________________________________   HISTORICAL  CHIEF COMPLAINT:  Chief Complaint  Patient presents with   Room 10/MS    Pt is here Alone. Pt states that she would like to talk about her Adderall and increasing it. Pt states that her medication kind of fades out or wears off and she gets fatigue. Pt states she is tolerating Tysabri  well. Pt states that lately she has been having leg cramps lately at night when she is sleeping on both feet mostly on the right foot,but happens to both and would like discuss other treatment options outside of Gabapentin .  Pt would like to discuss when her MRI is going to be scheduled.     HISTORY OF PRESENT ILLNESS:  Chloe Pittman is a 38 y.o. woman with relapsing remitting multiple sclerosis on Tysabri  therapy.   Update 5/192025: She is on Tysabri  and tolerates it well.  Last JCV 11/21/2023 and was negative 0.18.       She feels her MS is stable She notes cramps in her lower legs, R>L.   Gait and balance are stable - sometimes she is a little off balanced.  She uses the bannister on stairs.   She had one fall on stairs (slipped).  has ut has some fluctuating paresthesias in her right leg.  This is not painful and is not intense.   That leg had numbness and weakness with her initial MS exacerbation leading to diagnosis.  She denies weakness.  Balance is slightly off but she has no falls.  Balance is sometimes a little bit worse in the heat.  The right leg sometimes goes mildly spastic and has a right foot drop, especially if hot.  Bladder function is fine.  Vision is stable - occasional has a scintillating in her vision but she never loses vision.   Sometimes if bends over ad gets back up she has a mild blackening of vision  for seconds..  She is on cabergline for the pituitary tumor.  She is down to 1/2 pill once a week.  She is doing better with no lactation or menstrual issues.  She has Raynauds with rare episodes of several fingers turning white (showed classic picture).    Usually cold riggers an event.    She is on sertraline 100 mg with benefit for depression.   She continues Adderall for attention - would like to try a higher dose.   Adderall has helped her focus/attention some.    She notes she is more fatigued with worse cognition when she does not take it.   She works as a Armed forces operational officer.     MS HISTORY She developed numbness in her right side 12/2018.    She did not note much clumsiness initially.  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   Symptoms were worse the next day when she had intense numbness and also right arm clumsiness.  She presented to the ED.   She noted more numbness while in the hospital, especially from the breast down.    MRI's 12/18/2018 were c/w MS with 1-2 right temporal enhancing brain lesions and several non-enhancing cervical spine lesions but no definite thoracic spine lesions.  She received 5 days of IV Solumedrol and improved.  I saw  her shortly after the hospital stay and we started Tysabri  in March 2020   She has no FH of MS.  Her sister has psoriasis.  IMAGING MRI of the brain 01/09/2022 showed Multiple T2/FLAIR hyperintense foci in the hemispheres, left thalamus, left cerebellum and spinal cord in a pattern consistent with chronic demyelinating plaque associated with multiple sclerosis.  None of the foci enhance or appear to be acute.  Compared to the MRI dated 07/19/2021, there are no new lesions.    3 mm hypoenhancing focus in the right pituitary hemisphere consistent with a pituitary microadenoma. No acute findings.   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   MRI of the  brain 07/19/2021 showed T2/FLAIR hyperintense foci in the cerebral hemispheres and left cerebellar hemisphere.  The pattern and configuration are consistent with chronic demyelinating plaques associated with multiple sclerosis.  None of the foci appear to be acute.  They do not enhance.  Compared to the MRI dated 06/21/2020, there are no new lesions.   Pituitary appeared normal.    MRI brain 06/21/2020 shows T2/FLAIR hyperintense foci in the hemispheres and cerebellum in a pattern and configuration consistent with chronic demyelinating plaque associated with multiple sclerosis.  None of the foci appear to be acute and they do not enhance.  Compared to the MRI from 06/16/2019, there are no new lesions.  MRI of the brain 12/18/2018 shows multiple T2/flair hyperintense foci, many in the periventricular and juxtacortical white matter.  There are also a couple foci in the left cerebellar hemisphere and left middle cerebellar peduncle.  About 3 foci enhanced after contrast.    MRI of the cervical spine shows multiple lesions, to the right adjacent to C2-C3, dorsally towards the right adjacent to C4, centrally, little more to the left adjacent to C6.    LabsVitamin D was 28.9, HIV was negative.  REVIEW OF SYSTEMS: Constitutional: No fevers, chills, sweats, or change in appetite.  She has fatigue Eyes: No visual changes, double vision, eye pain Ear, nose and throat: No hearing loss, ear pain, nasal congestion, sore throat Cardiovascular: No chest pain, palpitations Respiratory:  No shortness of breath at rest or with exertion.   No wheezes GastrointestinaI: No nausea, vomiting, diarrhea, abdominal pain, fecal incontinence Genitourinary:  No dysuria, urinary retention or frequency.  No nocturia. Musculoskeletal:  No neck pain, back pain Integumentary: No rash, pruritus, skin lesions Neurological: as above Psychiatric: No depression at this time.  No anxiety Endocrine: No palpitations, diaphoresis, change in  appetite, change in weigh or increased thirst Hematologic/Lymphatic:  No anemia, purpura, petechiae. Allergic/Immunologic: No itchy/runny eyes, nasal congestion, recent allergic reactions, rashes  ALLERGIES: Allergies  Allergen Reactions   Sulfa Antibiotics Nausea Only    HOME MEDICATIONS:  Current Outpatient Medications:    amphetamine -dextroamphetamine  (ADDERALL) 15 MG tablet, 1 tablet in the morning. May take additional tablet at noon if necessary, Disp: 60 tablet, Rfl: 0   amphetamine -dextroamphetamine  (ADDERALL) 20 MG tablet, Take 1 tablet (20 mg total) by mouth daily. May take second table around noon, Disp: 60 tablet, Rfl: 0   ibuprofen  (ADVIL ) 200 MG tablet, Take 200 mg by mouth every 6 (six) hours as needed., Disp: , Rfl:    natalizumab  (TYSABRI ) 300 MG/15ML injection, Inject 300 mg into the vein every 28 (twenty-eight) days., Disp: , Rfl:    Nitroglycerin  0.4 % OINT, Apply 1/2 inch to affected areas as directed, Disp: 30 g, Rfl: 3  PAST MEDICAL HISTORY: Past Medical History:  Diagnosis Date   Hx of varicella    Medical history non-contributory    MS (multiple sclerosis) (HCC)     PAST SURGICAL HISTORY: Past Surgical History:  Procedure Laterality Date   FACIAL COSMETIC SURGERY     dog bite   HEMORRHOID SURGERY N/A 05/14/2018   Procedure: HEMORRHOIDECTOMY;  Surgeon: Candyce Champagne, MD;  Location: [facility];  Service: General;  Laterality: N/A;   INNER EAR SURGERY     PEXY N/A 05/14/2018   Procedure: HEMORRHOIDAL LIGATION/PEXY;  Surgeon: Candyce Champagne, MD;  Location: [facility];  Service: General;  Laterality: N/A;   RECTAL EXAM UNDER ANESTHESIA N/A 05/14/2018   Procedure: ANORECTAL EXAM UNDER ANESTHESIA;  Surgeon: Candyce Champagne, MD;  Location: [facility];  Service: General;  Laterality: N/A;   TUBES IN EARS      FAMILY HISTORY: Family History  Problem Relation Age of Onset   Diabetes Mother    Cancer Father        TONSIL    Hypertension Father    Diabetes Father    Breast  cancer Maternal Aunt 9   Cancer Paternal Aunt        SKIN   Heart disease Maternal Grandmother    Cancer Paternal Grandmother        LUNG AND LIVER   Stroke Paternal Grandmother    Multiple sclerosis Neg Hx     SOCIAL HISTORY:  Social History   Socioeconomic History   Marital status: Married    Spouse name: Not on file   Number of children: 1   Years of education: Associates degree   Highest education level: Not on file  Occupational History   Occupation: [employer]. [employer]  Tobacco Use   Smoking status: Former   Smokeless tobacco: Never   Tobacco comments:    Last smoked 5-19, just one cigarette  Vaping Use   Vaping status: Former  Substance and Sexual Activity   Alcohol use: Yes    Comment: SOCIAL - WEEK ENDS    Drug use: No   Sexual activity: Yes    Birth control/protection: None  Other Topics Concern   Not on file  Social History Narrative   Lives with husband    Caffeine use: 2 per day   Right handed    Social Drivers of Health   Financial Resource Strain: Low Risk  (08/26/2023)   Received from [facility]   Overall Financial Resource Strain (CARDIA)    Difficulty of Paying Living Expenses: Not hard at all  Food Insecurity: No Food Insecurity (08/26/2023)   Received from [facility]   Hunger Vital Sign    Worried About Running Out of Food in the Last Year: Never true    Ran Out of Food in the Last Year: Never true  Transportation Needs: No Transportation Needs (08/26/2023)   Received from [facility] - Transportation    Lack of Transportation (Medical): No    Lack of Transportation (Non-Medical): No  Physical Activity: Insufficiently Active (12/26/2021)   Received from [facility], [facility]   Exercise Vital Sign    Days of Exercise per Week: 1 day    Minutes of Exercise per Session: 40 min  Stress: Stress Concern Present (12/26/2021)   Received from [facility], [facility] -  Occupational Stress Questionnaire    Feeling of Stress : To some extent  Social Connections: Unknown (03/20/2022)  Received from [facility], [facility]   Social Network    Social Network: Not on file  Intimate Partner Violence: Unknown (02/09/2022)   Received from [facility], [facility]   HITS    Physically Hurt: Not on file    Insult or Talk Down To: Not on file    Threaten Physical Harm: Not on file    Scream or Curse: Not on file     PHYSICAL EXAM  Vitals:   03/23/24 0812  BP: 111/67  Pulse: 91  Weight: 144 lb 8 oz (65.5 kg)  Height: 5\' 10"  (1.778 m)   No results found.   Body mass index is 20.73 kg/m.   General: The patient is well-developed and well-nourished and in no acute distress .   Skin: Extremities are without rash or edema.  Neurologic Exam  Mental status: The patient is alert and oriented x 3 at the time of the examination. The patient has apparent normal recent and remote memory, with an apparently normal attention span and concentration ability.   Speech is normal.  Cranial nerves: Extraocular movements are full.   Facial strength and sensation was normal.  Trapezius and sternocleidomastoid strength is normal. No dysarthria is noted.   . No obvious hearing deficits are noted.  Motor:  Muscle bulk is normal.   Tone is normal. Strength is 5/5  Sensory: Sensation to touch and vibration is symmetric now  Coordination: Cerebellar testing reveals good finger-nose-finger and bilateral heel-to-shin  Gait and station: Station is normal.  Gait is normal.  The tandem gait is slightly wide.  Romberg is borderline.  Reflexes: Deep tendon reflexes are normal in the arms, 2+in the legs, with mild spread at the knees.  No ankle clonus.         Multiple sclerosis (HCC) - Plan: Stratify JCV Antibody Test ([facility]), MR BRAIN W WO CONTRAST  High risk medication use - Plan: Stratify JCV Antibody Test ([facility])  Attention deficit disorder (ADD) without  hyperactivity  Pituitary microadenoma (HCC) - Plan: MR BRAIN W WO CONTRAST  Ataxic gait  Restless leg syndrome - Plan: Iron, TIBC and Ferritin Panel  Iron deficiency - Plan: Iron, TIBC and Ferritin Panel   1.  Continue Tysabri . JCV Ab has been negative.  Recheck.  Check Brain MRI to determine if any subclinical progression.  If so, consider an anti-CD20 agent 2.   Increase Adderall to 20 mg po qAM and qnoon..   3.   Check iron and consider supplement of ropinirole nightly to help restless legs and sleep maintenance insomnia 5.   Now off cabergoline  for prolactinoma.    Recheck MRI pituitary 6.   NTG ointment for Raynaud's prn rtc 6 months or sooner if new or worsening neurologic symptoms    Keyly Baldonado A. Godwin Lat, MD, [facility] 03/23/2024, 9:08 AM Certified in Neurology, Clinical Neurophysiology, Sleep Medicine, Pain Medicine and Neuroimaging  [facility] [address] [place], [place] [address] [phone]

## 2024-03-24 ENCOUNTER — Ambulatory Visit: Payer: Self-pay | Admitting: Neurology

## 2024-03-24 LAB — IRON,TIBC AND FERRITIN PANEL
Ferritin: 17 ng/mL (ref 15–150)
Iron Saturation: 30 % (ref 15–55)
Iron: 106 ug/dL (ref 27–159)
Total Iron Binding Capacity: 355 ug/dL (ref 250–450)
UIBC: 249 ug/dL (ref 131–425)

## 2024-03-31 ENCOUNTER — Other Ambulatory Visit

## 2024-03-31 ENCOUNTER — Telehealth: Payer: Self-pay | Admitting: Neurology

## 2024-03-31 NOTE — Telephone Encounter (Signed)
 She is scheduled at [facility] for the brain MRI and she asked me if I can schedule the breast MRI here that was ordered by her OBGYN and I explained to her why we can't do those here and she understood. I said she can schedule both at [facility] and they can probably do the scans at the same time. She said she's been going back and forth with [facility] for months trying to schedule that MRI because she has a permanent dermal piercing in her chest she can't take out and they told her they can't do the MRI with the piercing. She said she's been getting MRIs for years of her spine with no problem so she doesn't see why this would be a problem. I told her that the metal causes black spots on the scan and since the piercing is close her to breast tissue that might be the problem but I don't know for sure. She said she wishes they would just try it and see what happens. I told her that it's up to GI because that's where her doctor sent the scan. She asked if there's something Dr. Godwin Lat can do to tell them they she has been getting MRIs of her spine for years and there's never been an issue?

## 2024-03-31 NOTE — Telephone Encounter (Signed)
 Chloe Pittman

## 2024-04-06 ENCOUNTER — Telehealth: Payer: Self-pay | Admitting: Neurology

## 2024-04-06 NOTE — Telephone Encounter (Signed)
 Pt is requesting a refill for amphetamine -dextroamphetamine  (ADDERALL) 20 MG tablet.  Pharmacy: [facility] [phone]

## 2024-04-06 NOTE — Telephone Encounter (Signed)
 Last seen 03/23/24 and next f/u not scheduled yet. Per Dr. Thom Fleeting last note: "Return in about 6 months (around 09/23/2024)."   Adderall 20mg  last sent in 03/23/24 #60. Called [facility] at [phone]. Spoke w/ pharmacist. She never filled rx. Last refilled adderall 15mg  on 03/05/24 #60.  I called pt and confirmed she did not pick up 20mg  strength because she was using up 15mg  that she filled in May. She will contact pharmacy to process 20mg .   I offered to schedule 6 month f/u for Nov 2025 but she is going to call back tomorrow to schedule. She wants to wait until she gets back to work to look at her schedule.

## 2024-04-08 ENCOUNTER — Ambulatory Visit (INDEPENDENT_AMBULATORY_CARE_PROVIDER_SITE_OTHER)

## 2024-04-08 DIAGNOSIS — G35 Multiple sclerosis: Secondary | ICD-10-CM | POA: Diagnosis not present

## 2024-04-08 DIAGNOSIS — D352 Benign neoplasm of pituitary gland: Secondary | ICD-10-CM | POA: Diagnosis not present

## 2024-04-08 MED ORDER — GADOBENATE DIMEGLUMINE 529 MG/ML IV SOLN
10.0000 mL | Freq: Once | INTRAVENOUS | Status: AC | PRN
  Administered 2024-04-08: 10 mL via INTRAVENOUS

## 2024-04-09 DIAGNOSIS — G35 Multiple sclerosis: Secondary | ICD-10-CM | POA: Diagnosis not present

## 2024-04-09 DIAGNOSIS — D352 Benign neoplasm of pituitary gland: Secondary | ICD-10-CM | POA: Diagnosis not present

## 2024-04-10 DIAGNOSIS — G35 Multiple sclerosis: Secondary | ICD-10-CM | POA: Diagnosis not present

## 2024-05-04 ENCOUNTER — Encounter: Payer: Self-pay | Admitting: Neurology

## 2024-05-04 ENCOUNTER — Other Ambulatory Visit: Payer: Self-pay | Admitting: Neurology

## 2024-05-04 MED ORDER — AMPHETAMINE-DEXTROAMPHETAMINE 20 MG PO TABS: 20.0000 mg | ORAL_TABLET | Freq: Every day | ORAL | 0 refills | Status: DC

## 2024-05-04 NOTE — Telephone Encounter (Signed)
 Pt called to request refill of medication  amphetamine -dextroamphetamine  (ADDERALL) 20 MG tablet  Pt would like medication sent to    [id number] - [place], [place] - [address] (Ph: [phone])

## 2024-05-04 NOTE — Telephone Encounter (Signed)
 Last seen on 03/23/24 No follow up scheduled    Dispensed Days Supply Quantity Provider Pharmacy  D-AMPHETAMINE  SALT COMBO 20MG  TABS 04/07/2024 30 60 each Sater, Charlie LABOR, MD [facility] #...     Rx pending to be signed

## 2024-05-05 NOTE — Telephone Encounter (Signed)
 Per [facility], last infusion 04/10/24. Tysabri  infusions are every 28 days. Due on or after for next infusion 05/08/24.

## 2024-05-15 DIAGNOSIS — J029 Acute pharyngitis, unspecified: Secondary | ICD-10-CM | POA: Diagnosis not present

## 2024-05-15 DIAGNOSIS — G35 Multiple sclerosis: Secondary | ICD-10-CM | POA: Diagnosis not present

## 2024-05-15 NOTE — Telephone Encounter (Signed)
 You can offer 2:30pm that day instead. That is the latest to offer

## 2024-05-15 NOTE — Telephone Encounter (Signed)
 I scheduled pt for 11/17 @ 2:30 pm.

## 2024-05-15 NOTE — Telephone Encounter (Signed)
 Jillian/Angel, can you call pt back and offer 09/21/24 at 1pm with Dr. Vear? Thank you

## 2024-06-10 ENCOUNTER — Encounter: Payer: Self-pay | Admitting: Neurology

## 2024-06-10 NOTE — Telephone Encounter (Signed)
 Last seen on 03/23/24 Follow up scheduled on 09/21/24   Dispensed Days Supply Quantity Provider Pharmacy  D-AMPHETAMINE  SALT COMBO 20MG  TABS 05/07/2024 30 60 each Sater, Charlie LABOR, MD [facility] #   Rx pending to be signed.

## 2024-06-11 ENCOUNTER — Other Ambulatory Visit: Payer: Self-pay | Admitting: Neurology

## 2024-06-11 MED ORDER — AMPHETAMINE-DEXTROAMPHETAMINE 20 MG PO TABS: 20.0000 mg | ORAL_TABLET | Freq: Every day | ORAL | 0 refills | Status: DC

## 2024-06-11 NOTE — Telephone Encounter (Signed)
 Pt is asking that the amphetamine -dextroamphetamine  (ADDERALL) 20 MG tablet be called into [facility]  [address] [place], [place] [address](Nezwd in a new tab or window)  [phone] Since primary pharmacy is out of the medication

## 2024-06-11 NOTE — Telephone Encounter (Signed)
 Dr. Vear you sent Rx into [facility] today for refill but they don't have Rx in stock. Pt asked if Rx can be sent to [facility] I have Rx pending to be signed to [employer].

## 2024-06-12 ENCOUNTER — Encounter: Payer: Self-pay | Admitting: Neurology

## 2024-06-12 DIAGNOSIS — G35 Multiple sclerosis: Secondary | ICD-10-CM | POA: Diagnosis not present

## 2024-06-15 NOTE — Telephone Encounter (Signed)
 Called [facility] at [phone]. Spoke w/ tech. Last refilled adderall 06/11/24 qty 60. Used discount card.

## 2024-07-03 ENCOUNTER — Other Ambulatory Visit: Payer: Self-pay | Admitting: Obstetrics and Gynecology

## 2024-07-03 DIAGNOSIS — N644 Mastodynia: Secondary | ICD-10-CM

## 2024-07-03 DIAGNOSIS — Z01419 Encounter for gynecological examination (general) (routine) without abnormal findings: Secondary | ICD-10-CM | POA: Diagnosis not present

## 2024-07-10 ENCOUNTER — Ambulatory Visit
Admission: RE | Admit: 2024-07-10 | Discharge: 2024-07-10 | Disposition: A | Discharge status: Home / Self Care | Source: Ambulatory Visit | Attending: Obstetrics and Gynecology | Admitting: Obstetrics and Gynecology

## 2024-07-10 DIAGNOSIS — N644 Mastodynia: Secondary | ICD-10-CM

## 2024-07-13 ENCOUNTER — Other Ambulatory Visit: Payer: Self-pay

## 2024-07-13 DIAGNOSIS — G35 Multiple sclerosis: Secondary | ICD-10-CM | POA: Diagnosis not present

## 2024-07-13 MED ORDER — AMPHETAMINE-DEXTROAMPHETAMINE 20 MG PO TABS: 20.0000 mg | ORAL_TABLET | Freq: Every day | ORAL | 0 refills | Status: DC

## 2024-07-17 ENCOUNTER — Other Ambulatory Visit: Payer: Self-pay | Admitting: Neurology

## 2024-07-17 MED ORDER — AMPHETAMINE-DEXTROAMPHETAMINE 20 MG PO TABS: 20.0000 mg | ORAL_TABLET | Freq: Two times a day (BID) | ORAL | 0 refills | Status: DC

## 2024-07-17 NOTE — Telephone Encounter (Signed)
 Pt called stating that she was informed that her medication has not even been processed so she will be needing more than 2-3 days worth of medication so that she is sure to be covered. Pt is thinking that poss 5 days should cover her but she does not know how long it really will be. Please advise.

## 2024-07-17 NOTE — Progress Notes (Signed)
 Patient called reporting [facility] has not sent her Adderall. Requested a 5 days supply to be sent at [facility] teether, then called 30 minutes later stating that [facility] does not have it and want it to be sent a [facility] [place]. Meds sent.

## 2024-07-17 NOTE — Telephone Encounter (Signed)
 Pt has yet to receive her adderall , she is asking if 2 or 3 days worth can be called into [facility] #[id number] while waiting on an order from  [facility]  Message routed as high priority to POD 3 which is in office today

## 2024-08-07 ENCOUNTER — Ambulatory Visit
Admission: RE | Admit: 2024-08-07 | Discharge: 2024-08-07 | Disposition: A | Discharge status: Home / Self Care | Source: Ambulatory Visit | Attending: Obstetrics and Gynecology | Admitting: Obstetrics and Gynecology

## 2024-08-07 DIAGNOSIS — Z803 Family history of malignant neoplasm of breast: Secondary | ICD-10-CM

## 2024-08-07 DIAGNOSIS — Z1239 Encounter for other screening for malignant neoplasm of breast: Secondary | ICD-10-CM | POA: Diagnosis not present

## 2024-08-07 MED ORDER — GADOPICLENOL 0.5 MMOL/ML IV SOLN
6.5000 mL | Freq: Once | INTRAVENOUS | Status: AC | PRN
  Administered 2024-08-07: 6.5 mL via INTRAVENOUS

## 2024-08-11 ENCOUNTER — Other Ambulatory Visit: Payer: Self-pay | Admitting: Obstetrics and Gynecology

## 2024-08-11 DIAGNOSIS — R9389 Abnormal findings on diagnostic imaging of other specified body structures: Secondary | ICD-10-CM

## 2024-08-13 DIAGNOSIS — H66005 Acute suppurative otitis media without spontaneous rupture of ear drum, recurrent, left ear: Secondary | ICD-10-CM | POA: Diagnosis not present

## 2024-08-14 DIAGNOSIS — G35A Relapsing-remitting multiple sclerosis: Secondary | ICD-10-CM | POA: Diagnosis not present

## 2024-08-20 ENCOUNTER — Other Ambulatory Visit

## 2024-08-24 ENCOUNTER — Other Ambulatory Visit

## 2024-08-24 ENCOUNTER — Ambulatory Visit
Admission: RE | Admit: 2024-08-24 | Discharge: 2024-08-24 | Disposition: A | Discharge status: Home / Self Care | Source: Ambulatory Visit | Attending: Obstetrics and Gynecology | Admitting: Obstetrics and Gynecology

## 2024-08-24 ENCOUNTER — Ambulatory Visit
Admission: RE | Admit: 2024-08-24 | Discharge: 2024-08-24 | Disposition: A | Source: Ambulatory Visit | Attending: Obstetrics and Gynecology | Admitting: Obstetrics and Gynecology

## 2024-08-24 DIAGNOSIS — R928 Other abnormal and inconclusive findings on diagnostic imaging of breast: Secondary | ICD-10-CM | POA: Diagnosis not present

## 2024-08-24 DIAGNOSIS — N6012 Diffuse cystic mastopathy of left breast: Secondary | ICD-10-CM | POA: Diagnosis not present

## 2024-08-24 DIAGNOSIS — R9389 Abnormal findings on diagnostic imaging of other specified body structures: Secondary | ICD-10-CM

## 2024-08-24 MED ORDER — GADOPICLENOL 0.5 MMOL/ML IV SOLN
7.0000 mL | Freq: Once | INTRAVENOUS | Status: AC | PRN
  Administered 2024-08-24: 7 mL via INTRAVENOUS

## 2024-08-25 LAB — SURGICAL PATHOLOGY

## 2024-08-28 DIAGNOSIS — N644 Mastodynia: Secondary | ICD-10-CM | POA: Diagnosis not present

## 2024-08-28 DIAGNOSIS — Z0000 Encounter for general adult medical examination without abnormal findings: Secondary | ICD-10-CM | POA: Diagnosis not present

## 2024-08-28 DIAGNOSIS — E559 Vitamin D deficiency, unspecified: Secondary | ICD-10-CM | POA: Diagnosis not present

## 2024-08-31 ENCOUNTER — Other Ambulatory Visit: Payer: Self-pay | Admitting: Neurology

## 2024-08-31 MED ORDER — AMPHETAMINE-DEXTROAMPHETAMINE 20 MG PO TABS: 20.0000 mg | ORAL_TABLET | Freq: Two times a day (BID) | ORAL | 0 refills | Status: DC

## 2024-08-31 NOTE — Telephone Encounter (Signed)
 Last seen 03/23/24 and next f/u 09/21/24. Last refilled 07/13/24 #45 and 07/17/24 #5.

## 2024-08-31 NOTE — Telephone Encounter (Signed)
 Pt is requesting a refill for 180 pills(90 day because pt takes 2 a day)amphetamine -dextroamphetamine  (ADDERALL) 20 MG tablet.  Pharmacy: [facility]

## 2024-09-07 ENCOUNTER — Telehealth: Payer: Self-pay | Admitting: Neurology

## 2024-09-07 DIAGNOSIS — Z0000 Encounter for general adult medical examination without abnormal findings: Secondary | ICD-10-CM | POA: Diagnosis not present

## 2024-09-07 MED ORDER — AMPHETAMINE-DEXTROAMPHETAMINE 20 MG PO TABS: 20.0000 mg | ORAL_TABLET | Freq: Two times a day (BID) | ORAL | 0 refills | Status: DC

## 2024-09-07 NOTE — Telephone Encounter (Signed)
 Dr. Vear- are you willing to call in a weeks worth of adderall? Pt took last pill today. Still waiting on mail order. If so, please e-scribe

## 2024-09-07 NOTE — Addendum Note (Signed)
 Addended by: VEAR ADE A on: 09/07/2024 12:39 PM   Modules accepted: Orders

## 2024-09-07 NOTE — Telephone Encounter (Signed)
 Sent urgent mychart to Dr. Vear to review.

## 2024-09-07 NOTE — Telephone Encounter (Signed)
 Patient called on call for override,new rx was sent it.  amphetamine -dextroamphetamine  (ADDERALL) 20 MG tablet 20 mg, 2 times daily 0 ordered       Summary: Take 1 tablet (20 mg total) by mouth 2 (two) times daily. May take second table around noon, Starting Mon 09/07/2024, Normal Dose, Route, Frequency: 20 mg, Oral, 2 times dailyStart: 11/03/2025Ordered On: 11/03/2025[id number] [place], [place] [address] Dx Associated: Taking: Long-term: Med Note:    Change Discontinue Directions: Take 1 tablet (20 mg total) by mouth 2 (two) times daily. May take second table around noon Ordering Department: [facility] Authorized By: Vear Charlie LABOR, MD Dispense: 28 tablet

## 2024-09-07 NOTE — Telephone Encounter (Signed)
 She is aware, we replied to Ramer and working on this

## 2024-09-07 NOTE — Telephone Encounter (Signed)
 Patient calling to make sure have received Mychart message in regards to a bridge for amphetamine -dextroamphetamine  (ADDERALL) 20 MG tablet.

## 2024-09-08 ENCOUNTER — Telehealth: Payer: Self-pay | Admitting: *Deleted

## 2024-09-08 NOTE — Telephone Encounter (Signed)
 Called pharmacy back and spoke w/ pharmacist. He was able to do override and got paid claim. Used savings card. Should be ready about 12pm for pt. I called pt and updated her. She verbalized understanding and appreciation.

## 2024-09-08 NOTE — Telephone Encounter (Signed)
 [facility] [phone]. Pharmacy currently closed. Opens at 8am

## 2024-09-08 NOTE — Telephone Encounter (Signed)
 Gave below to [facility] since they handle patient's PA for Tysabri 

## 2024-09-10 ENCOUNTER — Other Ambulatory Visit

## 2024-09-10 ENCOUNTER — Other Ambulatory Visit: Payer: Self-pay

## 2024-09-10 DIAGNOSIS — Z79899 Other long term (current) drug therapy: Secondary | ICD-10-CM

## 2024-09-10 DIAGNOSIS — G35D Multiple sclerosis, unspecified: Secondary | ICD-10-CM

## 2024-09-11 ENCOUNTER — Other Ambulatory Visit: Payer: Self-pay

## 2024-09-11 ENCOUNTER — Other Ambulatory Visit

## 2024-09-11 DIAGNOSIS — Z79899 Other long term (current) drug therapy: Secondary | ICD-10-CM | POA: Diagnosis not present

## 2024-09-11 DIAGNOSIS — G35A Relapsing-remitting multiple sclerosis: Secondary | ICD-10-CM | POA: Diagnosis not present

## 2024-09-11 DIAGNOSIS — G35D Multiple sclerosis, unspecified: Secondary | ICD-10-CM | POA: Diagnosis not present

## 2024-09-11 DIAGNOSIS — D352 Benign neoplasm of pituitary gland: Secondary | ICD-10-CM | POA: Diagnosis not present

## 2024-09-18 ENCOUNTER — Ambulatory Visit: Payer: Self-pay | Admitting: Neurology

## 2024-09-18 DIAGNOSIS — D352 Benign neoplasm of pituitary gland: Secondary | ICD-10-CM | POA: Diagnosis not present

## 2024-09-18 DIAGNOSIS — Z1330 Encounter for screening examination for mental health and behavioral disorders, unspecified: Secondary | ICD-10-CM | POA: Diagnosis not present

## 2024-09-18 LAB — CBC WITH DIFFERENTIAL/PLATELET
Basophils Absolute: 0.1 x10E3/uL (ref 0.0–0.2)
Basos: 1 %
EOS (ABSOLUTE): 0.7 x10E3/uL — ABNORMAL HIGH (ref 0.0–0.4)
Eos: 10 %
Hematocrit: 37.5 % (ref 34.0–46.6)
Hemoglobin: 12.4 g/dL (ref 11.1–15.9)
Immature Grans (Abs): 0 x10E3/uL (ref 0.0–0.1)
Immature Granulocytes: 0 %
Lymphocytes Absolute: 3.5 x10E3/uL — ABNORMAL HIGH (ref 0.7–3.1)
Lymphs: 49 %
MCH: 30.5 pg (ref 26.6–33.0)
MCHC: 33.1 g/dL (ref 31.5–35.7)
MCV: 92 fL (ref 79–97)
Monocytes Absolute: 0.7 x10E3/uL (ref 0.1–0.9)
Monocytes: 10 %
Neutrophils Absolute: 2.1 x10E3/uL (ref 1.4–7.0)
Neutrophils: 30 %
Platelets: 256 x10E3/uL (ref 150–450)
RBC: 4.06 x10E6/uL (ref 3.77–5.28)
RDW: 12.3 % (ref 11.7–15.4)
WBC: 7.1 x10E3/uL (ref 3.4–10.8)

## 2024-09-18 LAB — STRATIFY JCV(TM) AB W/INDEX: JCV Index Value: 0.14

## 2024-09-21 ENCOUNTER — Ambulatory Visit: Admitting: Neurology

## 2024-09-21 ENCOUNTER — Encounter: Payer: Self-pay | Admitting: Neurology

## 2024-09-21 VITALS — BP 100/62 | HR 86 | Ht 70.0 in | Wt 145.4 lb

## 2024-09-21 DIAGNOSIS — G35A Relapsing-remitting multiple sclerosis: Secondary | ICD-10-CM | POA: Diagnosis not present

## 2024-09-21 DIAGNOSIS — F988 Other specified behavioral and emotional disorders with onset usually occurring in childhood and adolescence: Secondary | ICD-10-CM

## 2024-09-21 DIAGNOSIS — F410 Panic disorder [episodic paroxysmal anxiety] without agoraphobia: Secondary | ICD-10-CM

## 2024-09-21 DIAGNOSIS — D352 Benign neoplasm of pituitary gland: Secondary | ICD-10-CM | POA: Diagnosis not present

## 2024-09-21 DIAGNOSIS — Z79899 Other long term (current) drug therapy: Secondary | ICD-10-CM | POA: Diagnosis not present

## 2024-09-21 DIAGNOSIS — I7300 Raynaud's syndrome without gangrene: Secondary | ICD-10-CM

## 2024-09-21 NOTE — Progress Notes (Signed)
 [facility]  PATIENT: Chloe Pittman DOB: 1985-12-22  REFERRING DOCTOR OR PCP: Franky Lot, MD SOURCE: Patient, emergency room/hospital notes, imaging and lab reports, multiple MRI images personally reviewed on PACS.  _________________________________   HISTORICAL  CHIEF COMPLAINT:  Chief Complaint  Patient presents with   Follow-up    Pt in room 10. Alone. Here MS follow up.  Pt reports panic attacks, has therapist already.     HISTORY OF PRESENT ILLNESS:  Chloe Pittman is a 38 y.o. woman with relapsing remitting multiple sclerosis on Tysabri  therapy.   Update 09/21/2024: She is on Tysabri  and tolerates it well.  Last JCV 11/21/2023 and was negative 0.14.    She feels her MS is stable  No exacerbation or new symptoms.    Gait and balance are stable - sometimes she is a little off balanced.  She uses the bannister on stairs.   She had one fall on stairs (slipped)..  She notes very mild right leg weakness (when doing some exercises at gym) and cramps in her lower legs, R>L.  She has  fluctuating paresthesias in her right leg.  This is not painful and is not intense.   That leg had numbness and weakness with her initial MS exacerbation leading to diagnosis.  The right leg sometimes goes mildly spastic and has a right foot drop, especially if hot.  Reuced grip on right.    She goes to the gym 3 times a week.     Bladder function is fine.    Vision is stable - occasional has a scintillating in her vision but she never loses vision.   Sometimes if bends over ad gets back up she has a mild blackening of vision for seconds..  She was on cabergline for the pituitary tumor.  She is down to 1/2 pill once a week.  She is doing better with no lactation or menstrual issues.   Labs have looked fine staying off cabergoline .    She has had some panic attacks (about 5).    She had one at her daughter's swim meet.   This lasted 10-15 minutes and was better after she went outside.     She denies much depression.   She feels she has normal work and family stress (has an 57 yo kid) but nothing extreme.  She was on sertraline 100 mg and anxiety was better but she felt mood was too flat..   She continues Adderall for attention - would like to try a higher dose.   Adderall has helped her focus/attention some.    She notes she is more fatigued with worse cognition when she does not take it.   She works as a armed forces operational officer.   She has occasional Raynaud's attacks in her hands.  She uses NTG with benefit.     She gets occasional cramps in her feet helped by NTG.      MS HISTORY She developed numbness in her right side 12/2018.    She did not note much clumsiness initially.  She works as a armed forces operational officer.  She worked the rest of the day .   Symptoms were worse the next day when she had intense numbness and also right arm clumsiness.  She presented to the ED.   She noted more numbness while in the hospital, especially from the breast down.    MRI's 12/18/2018 were c/w MS with 1-2 right temporal enhancing brain lesions and several non-enhancing cervical spine lesions  but no definite thoracic spine lesions.  She received 5 days of IV Solumedrol and improved.  I saw her shortly after the hospital stay and we started Tysabri  in March 2020   She has no FH of MS.  Her sister has psoriasis.  IMAGING MRI of the brain 01/09/2022 showed Multiple T2/FLAIR hyperintense foci in the hemispheres, left thalamus, left cerebellum and spinal cord in a pattern consistent with chronic demyelinating plaque associated with multiple sclerosis.  None of the foci enhance or appear to be acute.  Compared to the MRI dated 07/19/2021, there are no new lesions.    3 mm hypoenhancing focus in the right pituitary hemisphere consistent with a pituitary microadenoma. No acute findings.   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   MRI of the  brain 07/19/2021 showed T2/FLAIR hyperintense foci in the cerebral hemispheres and left cerebellar hemisphere.  The pattern and configuration are consistent with chronic demyelinating plaques associated with multiple sclerosis.  None of the foci appear to be acute.  They do not enhance.  Compared to the MRI dated 06/21/2020, there are no new lesions.   Pituitary appeared normal.    MRI brain 06/21/2020 shows T2/FLAIR hyperintense foci in the hemispheres and cerebellum in a pattern and configuration consistent with chronic demyelinating plaque associated with multiple sclerosis.  None of the foci appear to be acute and they do not enhance.  Compared to the MRI from 06/16/2019, there are no new lesions.  MRI of the brain 12/18/2018 shows multiple T2/flair hyperintense foci, many in the periventricular and juxtacortical white matter.  There are also a couple foci in the left cerebellar hemisphere and left middle cerebellar peduncle.  About 3 foci enhanced after contrast.    MRI of the cervical spine shows multiple lesions, to the right adjacent to C2-C3, dorsally towards the right adjacent to C4, centrally, little more to the left adjacent to C6.    LabsVitamin D was 28.9, HIV was negative.  REVIEW OF SYSTEMS: Constitutional: No fevers, chills, sweats, or change in appetite.  She has fatigue Eyes: No visual changes, double vision, eye pain Ear, nose and throat: No hearing loss, ear pain, nasal congestion, sore throat Cardiovascular: No chest pain, palpitations Respiratory:  No shortness of breath at rest or with exertion.   No wheezes GastrointestinaI: No nausea, vomiting, diarrhea, abdominal pain, fecal incontinence Genitourinary:  No dysuria, urinary retention or frequency.  No nocturia. Musculoskeletal:  No neck pain, back pain Integumentary: No rash, pruritus, skin lesions Neurological: as above Psychiatric: No depression at this time.  No anxiety Endocrine: No palpitations, diaphoresis, change in  appetite, change in weigh or increased thirst Hematologic/Lymphatic:  No anemia, purpura, petechiae. Allergic/Immunologic: No itchy/runny eyes, nasal congestion, recent allergic reactions, rashes  ALLERGIES: Allergies  Allergen Reactions   Sulfa Antibiotics Nausea Only    HOME MEDICATIONS:  Current Outpatient Medications:    amphetamine -dextroamphetamine  (ADDERALL) 20 MG tablet, Take 1 tablet (20 mg total) by mouth 2 (two) times daily. May take second table around noon, Disp: 28 tablet, Rfl: 0   ibuprofen  (ADVIL ) 200 MG tablet, Take 200 mg by mouth every 6 (six) hours as needed., Disp: , Rfl:    natalizumab  (TYSABRI ) 300 MG/15ML injection, Inject 300 mg into the vein every 28 (twenty-eight) days., Disp: , Rfl:    Nitroglycerin  0.4 % OINT, Apply 1/2 inch to affected areas as directed, Disp: 30 g, Rfl: 3  PAST MEDICAL  HISTORY: Past Medical History:  Diagnosis Date   Hx of varicella    Medical history non-contributory    MS (multiple sclerosis)     PAST SURGICAL HISTORY: Past Surgical History:  Procedure Laterality Date   FACIAL COSMETIC SURGERY     dog bite   HEMORRHOID SURGERY N/A 05/14/2018   Procedure: HEMORRHOIDECTOMY;  Surgeon: Sheldon Standing, MD;  Location: [facility];  Service: General;  Laterality: N/A;   INNER EAR SURGERY     PEXY N/A 05/14/2018   Procedure: HEMORRHOIDAL LIGATION/PEXY;  Surgeon: Sheldon Standing, MD;  Location: [facility];  Service: General;  Laterality: N/A;   RECTAL EXAM UNDER ANESTHESIA N/A 05/14/2018   Procedure: ANORECTAL EXAM UNDER ANESTHESIA;  Surgeon: Sheldon Standing, MD;  Location: [facility];  Service: General;  Laterality: N/A;   TUBES IN EARS      FAMILY HISTORY: Family History  Problem Relation Age of Onset   Diabetes Mother    Cancer Father        TONSIL    Hypertension Father    Diabetes Father    Breast cancer Maternal Aunt 16   Cancer Paternal Aunt        SKIN   Heart disease Maternal Grandmother    Cancer Paternal Grandmother        LUNG AND  LIVER   Stroke Paternal Grandmother    Multiple sclerosis Neg Hx     SOCIAL HISTORY:  Social History   Socioeconomic History   Marital status: Married    Spouse name: Not on file   Number of children: 1   Years of education: Associates degree   Highest education level: Not on file  Occupational History   Occupation: [employer]. [employer]  Tobacco Use   Smoking status: Former   Smokeless tobacco: Never   Tobacco comments:    Last smoked 5-19, just one cigarette  Vaping Use   Vaping status: Former  Substance and Sexual Activity   Alcohol use: Yes    Comment: SOCIAL - WEEK ENDS    Drug use: No   Sexual activity: Yes    Birth control/protection: None  Other Topics Concern   Not on file  Social History Narrative   Lives with husband    Caffeine use: 2 per day   Right handed    Social Drivers of Health   Financial Resource Strain: Low Risk  (09/15/2024)   Received from [facility]   Overall Financial Resource Strain (CARDIA)    How hard is it for you to pay for the very basics like food, housing, medical care, and heating?: Not hard at all  Food Insecurity: No Food Insecurity (09/15/2024)   Received from [facility]   Hunger Vital Sign    Within the past 12 months, you worried that your food would run out before you got the money to buy more.: Never true    Within the past 12 months, the food you bought just didn't last and you didn't have money to get more.: Never true  Transportation Needs: No Transportation Needs (09/15/2024)   Received from [facility] - Transportation    In the past 12 months, has lack of transportation kept you from medical appointments or from getting medications?: No    In the past 12 months, has lack of transportation kept you from meetings, work, or from getting things needed for daily living?: No  Physical Activity: Sufficiently Active (09/15/2024)   Received from [facility]   Exercise Vital  Sign    On average, how many  days per week do you engage in moderate to strenuous exercise (like a brisk walk)?: 3 days    On average, how many minutes do you engage in exercise at this level?: 60 min  Stress: Stress Concern Present (09/15/2024)   Received from [facility] - Occupational Stress Questionnaire    Do you feel stress - tense, restless, nervous, or anxious, or unable to sleep at night because your mind is troubled all the time - these days?: Very much  Social Connections: Socially Integrated (09/15/2024)   Received from [facility]   Social Network    How would you rate your social network (family, work, friends)?: Good participation with social networks  Intimate Partner Violence: Not At Risk (09/15/2024)   Received from [facility]   HITS    Over the last 12 months how often did your partner scream or curse at you?: Rarely    Over the last 12 months how often did your partner threaten you with physical harm?: Never    Over the last 12 months how often did your partner insult you or talk down to you?: Rarely    Over the last 12 months how often did your partner physically hurt you?: Never     PHYSICAL EXAM  Vitals:   09/21/24 1432  BP: 100/62  Pulse: 86  SpO2: 96%  Weight: 145 lb 6.4 oz (66 kg)  Height: 5' 10 (1.778 m)   No results found.   Body mass index is 20.86 kg/m.   General: The patient is well-developed and well-nourished and in no acute distress .   Skin: Extremities are without rash or edema.  Neurologic Exam  Mental status: The patient is alert and oriented x 3 at the time of the examination. The patient has apparent normal recent and remote memory, with an apparently normal attention span and concentration ability.   Speech is normal.  Cranial nerves: Extraocular movements are full.   Facial strength and sensation was normal.  Trapezius and sternocleidomastoid strength is normal. No dysarthria is noted.   . No obvious hearing  deficits are noted.  Motor:  Muscle bulk is normal.   Tone is normal. Strength is 5/5  Sensory: Sensation to touch and vibration is symmetric now  Coordination: Cerebellar testing reveals good finger-nose-finger and bilateral heel-to-shin  Gait and station: Station is normal.  Gait is normal.  Her tandem gait is slightly wide.  No Romberg sign today.  Reflexes: Deep tendon reflexes are normal in the arms, 2+in the legs, with mild spread at the knees.  No ankle clonus.         Multiple sclerosis, relapsing-remitting  High risk medication use  Attention deficit disorder (ADD) without hyperactivity  Pituitary microadenoma (HCC)  Raynaud's phenomenon without gangrene  Panic attack   1.  Continue Tysabri . JCV Ab has been negative.  Recheck MRI with pituitary in 2026 2.   Increase Adderall to 20 mg po qAM and qnoon..   3.   Check iron and consider supplement of ropinirole nightly to help restless legs and sleep maintenance insomnia 5.   Now off cabergoline  for prolactinoma.    Recheck MRI pituitary next tie we check brain MRi 6.   NTG ointment for Raynaud's prn rtc 6 months or sooner if new or worsening neurologic symptoms    Jonette Wassel A. Vear, MD, [facility] 09/21/2024, 8:14 PM Certified in Neurology, Clinical Neurophysiology, Sleep  Medicine, Pain Medicine and Neuroimaging  [facility] [address] [place], [place] [address] [phone]

## 2024-10-15 ENCOUNTER — Other Ambulatory Visit: Payer: Self-pay

## 2024-10-15 DIAGNOSIS — G35A Relapsing-remitting multiple sclerosis: Secondary | ICD-10-CM

## 2024-10-15 DIAGNOSIS — G35D Multiple sclerosis, unspecified: Secondary | ICD-10-CM

## 2024-10-15 NOTE — Progress Notes (Signed)
JCV +

## 2024-10-16 ENCOUNTER — Other Ambulatory Visit: Payer: Self-pay

## 2024-10-16 DIAGNOSIS — Z0289 Encounter for other administrative examinations: Secondary | ICD-10-CM

## 2024-10-30 LAB — STRATIFY JCV(TM) AB W/INDEX
JCV Antibody: NEGATIVE
JCV Index Value: 0.12

## 2024-11-11 ENCOUNTER — Telehealth: Payer: Self-pay | Admitting: Neurology

## 2024-11-11 NOTE — Telephone Encounter (Signed)
 Unfortunately we cannot send orders to a [facility] infusion clinic. It would have to be a [facility] doctor who did. Can you find out if she's wanting a referral to [facility] neurology to transfer care?

## 2024-11-11 NOTE — Telephone Encounter (Signed)
 Pt is asking for the process to begin for her infusion therapy to be transferred from [facility] to [facility](which is closer to her) their [phone] fax [phone]

## 2024-11-16 MED ORDER — AMPHETAMINE-DEXTROAMPHETAMINE 20 MG PO TABS: 20.0000 mg | ORAL_TABLET | Freq: Two times a day (BID) | ORAL | 0 refills | Status: AC

## 2024-11-16 NOTE — Telephone Encounter (Signed)
 Requested Prescriptions   Pending Prescriptions Disp Refills   amphetamine -dextroamphetamine  (ADDERALL) 20 MG tablet 180 tablet 1    Sig: Take 1 tablet (20 mg total) by mouth 2 (two) times daily. May take second table around noon   Last seen 09/21/24 Next appt 04/27/25 Dispenses   Dispensed Days Supply Quantity Provider Pharmacy  DEXTROAMP-AMPHETAMIN 20 MG TAB 09/08/2024 14 28 each Sater, Charlie LABOR, MD [facility] [phone] - [place]...  DEXTROAMP-AMPHETAMIN 20 MG TAB 08/31/2024 90 180 tablet Sater, Charlie LABOR, MD [facility] D...  DEXTROAMP-AMPHETAMIN 20 MG TAB 07/17/2024 5 10 each Camara, Amadou, MD [facility] [phone] - [place]...  DEXTROAMP-AMPHETAMIN 20 MG TAB 07/13/2024 45 90 tablet Sater, Charlie LABOR, MD [facility]...  D-AMPHETAMINE  SALT COMBO 20MG  TABS 05/07/2024 30 60 each Sater, Charlie LABOR, MD [facility] #...  D-AMPHETAMINE  SALT COMBO 20MG  TABS 04/07/2024 30 60 each Sater, Charlie LABOR, MD [facility] #...  D-AMPHETAMINE  SALT COMBO 15MG  TABS 03/05/2024 30 60 each Lomax, Amy, NP [facility] #...  D-AMPHETAMINE  SALT COMBO 15MG  TABS 01/31/2024 30 60 each Dohmeier, Dedra, MD [facility] #...  D-AMPHETAMINE  SALT COMBO 15MG  TABS 01/01/2024 30 60 each Lomax, Amy, NP [facility] #...  D-AMPHETAMINE  SALT COMBO 15MG  TABS 11/28/2023 30 60 each Sater, Charlie LABOR, MD [facility] #...    [facility]

## 2024-11-16 NOTE — Addendum Note (Signed)
 Addended by: VEAR ADE A on: 11/16/2024 04:40 PM   Modules accepted: Orders

## 2024-11-16 NOTE — Addendum Note (Signed)
 Addended by: ONEITA NEVELYN BRAVO on: 11/16/2024 04:04 PM   Modules accepted: Orders

## 2024-11-16 NOTE — Addendum Note (Signed)
 Addended by: ONEITA HOIST E on: 11/16/2024 04:10 PM   Modules accepted: Orders

## 2024-11-25 NOTE — Telephone Encounter (Signed)
 Pt came in for last infusion. States PA needs to be stopped for her drugs at our infusion site. States where she will be getting her infusions will be [facility] address- [address]. [phone]. States they are needing our office to fax info about her MS drug. She has spoken with her insurance and they are fine with pt doing the rest of her infusions at this facility.  Fax: [phone]- nurse she spoke with states the orders can be faxed to this number.  Pt would like a call with any questions or if she needs a visit with Dr. Vear.  Pt states insurance will also need to be updated on a change, and that it needs to be written as urgent so she does not miss her next infusion.  [phone]

## 2024-11-25 NOTE — Telephone Encounter (Signed)
 That is correct she will either have to get a transfer of care to a [facility] provider if she wishes to have her infusions switched over. Even if her insurance says she can infuse with [facility], the [facility] infusion facility will not accept the orders from a doctor that does not have privileges to order infusions at [facility]. Please check with patient to see if she is wanting a referral to switch her care to a [facility] neurologist where she can get her infusions closer to home or if she wants to stay with Dr Vear, an option may be to do home infusions. I do not know the cost comparison but she could check with her insurance. We would have to get an approval from Dr Vear to switch to home infusion.

## 2024-12-06 NOTE — Telephone Encounter (Signed)
 Our office will be closed on 2/2, please let the patient know when you can work her back in for her virtual visit.

## 2024-12-07 ENCOUNTER — Telehealth: Admitting: Neurology

## 2024-12-11 ENCOUNTER — Telehealth: Admitting: Neurology

## 2024-12-11 ENCOUNTER — Encounter: Payer: Self-pay | Admitting: Neurology

## 2024-12-11 DIAGNOSIS — D352 Benign neoplasm of pituitary gland: Secondary | ICD-10-CM

## 2024-12-11 DIAGNOSIS — F988 Other specified behavioral and emotional disorders with onset usually occurring in childhood and adolescence: Secondary | ICD-10-CM

## 2024-12-11 DIAGNOSIS — Z79899 Other long term (current) drug therapy: Secondary | ICD-10-CM

## 2024-12-11 DIAGNOSIS — I7300 Raynaud's syndrome without gangrene: Secondary | ICD-10-CM

## 2024-12-11 DIAGNOSIS — G35A Relapsing-remitting multiple sclerosis: Secondary | ICD-10-CM

## 2024-12-11 DIAGNOSIS — G2581 Restless legs syndrome: Secondary | ICD-10-CM

## 2024-12-11 NOTE — Progress Notes (Signed)
 "  [facility]  PATIENT: Chloe Pittman DOB: 03/08/1986  REFERRING DOCTOR OR PCP: Franky Lot, MD SOURCE: Patient, emergency room/hospital notes, imaging and lab reports, multiple MRI images personally reviewed on PACS.  _________________________________   HISTORICAL  CHIEF COMPLAINT:  Chief Complaint  Patient presents with   Multiple Sclerosis    On Tysabri    Virtual Visit via Video Note I connected with Chloe Pittman  on 12/11/24 at 10:30 AM EST by a video enabled telemedicine application and verified that I am speaking with the correct person.  I discussed the limitations of evaluation and management by telemedicine and the availability of in person appointments. The patient expressed understanding and agreed to proceed.  Patient in home.  Provider at office    HISTORY OF PRESENT ILLNESS:  Chloe Pittman is a 39 y.o. woman with relapsing remitting multiple sclerosis on Tysabri  therapy.   Update 12/11/2024: She is on Tysabri  and tolerates it well.  Last JCV 09/18/2024 and was negative 0.14.    She feels her MS is stable  No exacerbation or new symptoms.   She needs to change infusion centers to [facility].  She will be seeing Dr. Romualdo to get her site switched.    Gait and balance are stable - sometimes she is a little off balanced.  She uses the bannister on stairs.   She has a mild right foot drop at times.    She notes very mild right leg weakness (when doing some exercises at gym) and cramps in her lower legs, R>L.  She has  fluctuating paresthesias in her right leg.   That leg had numbness and weakness with her initial MS exacerbation leading to diagnosis.  The right leg sometimes goes mildly spastic and has a right foot drop, especially if hot. Hand grip is back to normal now.     Bladder function is fine.    Vision is stable.  She was on cabergline for the pituitary tumor.  She sees Endocrinologist (Dr. Beryl).    She is doing better with no lactation or  menstrual issues.   Labs have looked fine staying off cabergoline .    She has had some panic attacks (about 5).    She had one at her daughter's swim meet.   This lasted 10-15 minutes and was better after she went outside.    She denies much depression.   She feels she has normal work and family stress (has an 39 yo kid) but nothing extreme.  She was on sertraline 100 mg and anxiety was better but she felt mood was too flat and stopped.   We increased her stimulant dose.  Adderall has helped her focus/attention some.    She notes she is more fatigued with worse cognition when she does not take it.   She works as a armed forces operational officer.   She has occasional Raynaud's attacks in her hands.  She uses NTG with benefit.         MS HISTORY She developed numbness in her right side 12/2018.    She did not note much clumsiness initially.  She works as a armed forces operational officer.  She worked the rest of the day .   Symptoms were worse the next day when she had intense numbness and also right arm clumsiness.  She presented to the ED.   She noted more numbness while in the hospital, especially from the breast down.    MRI's 12/18/2018 were c/w MS with 1-2  right temporal enhancing brain lesions and several non-enhancing cervical spine lesions but no definite thoracic spine lesions.  She received 5 days of IV Solumedrol and improved.  I saw her shortly after the hospital stay and we started Tysabri  in March 2020   She has no FH of MS.  Her sister has psoriasis.  IMAGING MRI of the brain 01/09/2022 showed Multiple T2/FLAIR hyperintense foci in the hemispheres, left thalamus, left cerebellum and spinal cord in a pattern consistent with chronic demyelinating plaque associated with multiple sclerosis.  None of the foci enhance or appear to be acute.  Compared to the MRI dated 07/19/2021, there are no new lesions.    3 mm hypoenhancing focus in the right pituitary hemisphere consistent with a pituitary microadenoma. No acute findings.    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   MRI of the brain 07/19/2021 showed T2/FLAIR hyperintense foci in the cerebral hemispheres and left cerebellar hemisphere.  The pattern and configuration are consistent with chronic demyelinating plaques associated with multiple sclerosis.  None of the foci appear to be acute.  They do not enhance.  Compared to the MRI dated 06/21/2020, there are no new lesions.   Pituitary appeared normal.    MRI brain 06/21/2020 shows T2/FLAIR hyperintense foci in the hemispheres and cerebellum in a pattern and configuration consistent with chronic demyelinating plaque associated with multiple sclerosis.  None of the foci appear to be acute and they do not enhance.  Compared to the MRI from 06/16/2019, there are no new lesions.  MRI of the brain 12/18/2018 shows multiple T2/flair hyperintense foci, many in the periventricular and juxtacortical white matter.  There are also a couple foci in the left cerebellar hemisphere and left middle cerebellar peduncle.  About 3 foci enhanced after contrast.    MRI of the cervical spine shows multiple lesions, to the right adjacent to C2-C3, dorsally towards the right adjacent to C4, centrally, little more to the left adjacent to C6.    LabsVitamin D was 28.9, HIV was negative.  REVIEW OF SYSTEMS: Constitutional: No fevers, chills, sweats, or change in appetite.  She has fatigue Eyes: No visual changes, double vision, eye pain Ear, nose and throat: No hearing loss, ear pain, nasal congestion, sore throat Cardiovascular: No chest pain, palpitations Respiratory:  No shortness of breath at rest or with exertion.   No wheezes GastrointestinaI: No nausea, vomiting, diarrhea, abdominal pain, fecal incontinence Genitourinary:  No dysuria, urinary retention or frequency.  No nocturia. Musculoskeletal:  No neck pain, back pain Integumentary: No rash, pruritus, skin  lesions Neurological: as above Psychiatric: No depression at this time.  No anxiety Endocrine: No palpitations, diaphoresis, change in appetite, change in weigh or increased thirst Hematologic/Lymphatic:  No anemia, purpura, petechiae. Allergic/Immunologic: No itchy/runny eyes, nasal congestion, recent allergic reactions, rashes  ALLERGIES: Allergies  Allergen Reactions   Sulfa Antibiotics Nausea Only    HOME MEDICATIONS:  Current Outpatient Medications:    amphetamine -dextroamphetamine  (ADDERALL) 20 MG tablet, Take 1 tablet (20 mg total) by mouth 2 (two) times daily. May take second table around noon, Disp: 180 tablet, Rfl: 0   ibuprofen  (ADVIL ) 200 MG tablet, Take 200 mg by mouth every 6 (six) hours as needed., Disp: , Rfl:    natalizumab  (TYSABRI ) 300 MG/15ML injection, Inject 300 mg into the vein every 28 (twenty-eight) days., Disp: , Rfl:    Nitroglycerin  0.4 % OINT, Apply 1/2 inch to affected  areas as directed, Disp: 30 g, Rfl: 3  PAST MEDICAL HISTORY: Past Medical History:  Diagnosis Date   Hx of varicella    Medical history non-contributory    MS (multiple sclerosis)     PAST SURGICAL HISTORY: Past Surgical History:  Procedure Laterality Date   FACIAL COSMETIC SURGERY     dog bite   HEMORRHOID SURGERY N/A 05/14/2018   Procedure: HEMORRHOIDECTOMY;  Surgeon: Sheldon Standing, MD;  Location: [facility];  Service: General;  Laterality: N/A;   INNER EAR SURGERY     PEXY N/A 05/14/2018   Procedure: HEMORRHOIDAL LIGATION/PEXY;  Surgeon: Sheldon Standing, MD;  Location: [facility];  Service: General;  Laterality: N/A;   RECTAL EXAM UNDER ANESTHESIA N/A 05/14/2018   Procedure: ANORECTAL EXAM UNDER ANESTHESIA;  Surgeon: Sheldon Standing, MD;  Location: [facility];  Service: General;  Laterality: N/A;   TUBES IN EARS      FAMILY HISTORY: Family History  Problem Relation Age of Onset   Diabetes Mother    Cancer Father        TONSIL    Hypertension Father    Diabetes Father    Breast cancer  Maternal Aunt 37   Cancer Paternal Aunt        SKIN   Heart disease Maternal Grandmother    Cancer Paternal Grandmother        LUNG AND LIVER   Stroke Paternal Grandmother    Multiple sclerosis Neg Hx     SOCIAL HISTORY:  Social History   Socioeconomic History   Marital status: Married    Spouse name: Not on file   Number of children: 1   Years of education: Associates degree   Highest education level: Not on file  Occupational History   Occupation: [employer]. [employer]  Tobacco Use   Smoking status: Former   Smokeless tobacco: Never   Tobacco comments:    Last smoked 5-19, just one cigarette  Vaping Use   Vaping status: Former  Substance and Sexual Activity   Alcohol use: Yes    Comment: SOCIAL - WEEK ENDS    Drug use: No   Sexual activity: Yes    Birth control/protection: None  Other Topics Concern   Not on file  Social History Narrative   Lives with husband    Caffeine use: 2 per day   Right handed    Social Drivers of Health   Tobacco Use: Low Risk (11/07/2024)   Received from [facility]   Patient History    Smoking Tobacco Use: Never    Smokeless Tobacco Use: Never    Passive Exposure: Not on file  Recent Concern: Tobacco Use - Medium Risk (09/21/2024)   Patient History    Smoking Tobacco Use: Former    Smokeless Tobacco Use: Never    Passive Exposure: Not on file  Financial Resource Strain: Low Risk (09/15/2024)   Received from [facility]   Overall Financial Resource Strain (CARDIA)    How hard is it for you to pay for the very basics like food, housing, medical care, and heating?: Not hard at all  Food Insecurity: No Food Insecurity (09/15/2024)   [facility]   Epic    Within the past 12 months, you worried that your food would run out before you got the money to buy more.: Never true    Within the past 12 months, the food you bought just didn't last and you didn't have money to get more.: Never true  Transportation  Needs: No Transportation Needs (09/15/2024)   Received from [facility]    In the past 12 months, has lack of transportation kept you from medical appointments or from getting medications?: No    In the past 12 months, has lack of transportation kept you from meetings, work, or from getting things needed for daily living?: No  Physical Activity: Sufficiently Active (09/15/2024)   Received from [facility]   Exercise Vital Sign    On average, how many days per week do you engage in moderate to strenuous exercise (like a brisk walk)?: 3 days    On average, how many minutes do you engage in exercise at this level?: 60 min  Stress: Stress Concern Present (09/15/2024)   Received from [facility] - Occupational Stress Questionnaire    Do you feel stress - tense, restless, nervous, or anxious, or unable to sleep at night because your mind is troubled all the time - these days?: Very much  Social Connections: Socially Integrated (09/15/2024)   Received from [facility]   Social Network    How would you rate your social network (family, work, friends)?: Good participation with social networks  Intimate Partner Violence: Not At Risk (09/15/2024)   Received from [facility]   HITS    Over the last 12 months how often did your partner scream or curse at you?: Rarely    Over the last 12 months how often did your partner threaten you with physical harm?: Never    Over the last 12 months how often did your partner insult you or talk down to you?: Rarely    Over the last 12 months how often did your partner physically hurt you?: Never  Depression (PHQ2-9): Not on file  Alcohol Screen: Not on file  Housing: Low Risk (09/15/2024)   Received from [facility]    In the last 12 months, was there a time when you were not able to pay the mortgage or rent on time?: No    In the past 12 months, how many times have you moved where you were  living?: 0    At any time in the past 12 months, were you homeless or living in a shelter (including now)?: No  Utilities: Not At Risk (09/15/2024)   Received from [facility]    In the past 12 months has the electric, gas, oil, or water company threatened to shut off services in your home?: No  Health Literacy: Not on file     PHYSICAL EXAM  There were no vitals filed for this visit.  No results found.   There is no height or weight on file to calculate BMI.   General: The patient is well-developed and well-nourished and in no acute distress .   Skin: Extremities are without rash or edema.  Neurologic Exam  Mental status: The patient is alert and oriented x 3 at the time of the examination. The patient has apparent normal recent and remote memory, with an apparently normal attention span and concentration ability.   Speech is normal.  Cranial nerves: Extraocular movements are full.   Facial strength and sensation was normal.  Trapezius and sternocleidomastoid strength is normal. No dysarthria is noted.   . No obvious hearing deficits are noted.  Motor:  Muscle bulk is normal.   Tone is normal. Strength is 5/5  Sensory: Sensation to touch and  vibration is symmetric now  Coordination: Cerebellar testing reveals good finger-nose-finger and bilateral heel-to-shin  Gait and station: Station is normal.  Gait is normal.  Her tandem gait is slightly wide.  No Romberg sign today.  Reflexes: Deep tendon reflexes are normal in the arms, 2+in the legs, with mild spread at the knees.  No ankle clonus.         Multiple sclerosis, relapsing-remitting  Attention deficit disorder (ADD) without hyperactivity  Pituitary microadenoma (HCC)  High risk medication use  Raynaud's phenomenon without gangrene  Restless leg syndrome   1.  Continue Tysabri . JCV Ab has been negative.  Recheck MRI with pituitary q 1-2 years 2.   Continue Adderall to 20 mg po qAM and qnoon..    3.   Check iron and consider supplement of ropinirole nightly to help restless legs and sleep maintenance insomnia 5.   Now off cabergoline  for prolactinoma.    Sees Endocrinology.    Last prolactin 09/2024 was normal  6.   NTG ointment for Raynaud's prn rtc 6 months or sooner if new or worsening neurologic symptoms   Follow Up Instructions: I discussed the assessment and treatment plan with the patient. The patient was provided an opportunity to ask questions and all were answered. The patient agreed with the plan and demonstrated an understanding of the instructions.    The patient was advised to call back or seek an in-person evaluation if the symptoms worsen or if the condition fails to improve as anticipated.  I provided 30 minutes of non-face-to-face time during this encounter.  Send [facility] infusion center   fax [phone]       Chloe Pittman A. Vear, MD, [facility] 12/11/2024, 11:15 AM Certified in Neurology, Clinical Neurophysiology, Sleep Medicine, Pain Medicine and Neuroimaging  [facility] [address] [place], [place] [address] [phone] "

## 2025-04-27 ENCOUNTER — Ambulatory Visit: Admitting: Neurology
